# Patient Record
Sex: Male | Born: 1990 | Race: White | Hispanic: No | Marital: Single | Smoking: Never smoker
Health system: Southern US, Community
[De-identification: ages and names within clinical notes are randomized; demographics above are authoritative.]

## PROBLEM LIST (undated history)

## (undated) DIAGNOSIS — G4700 Insomnia, unspecified: Secondary | ICD-10-CM

## (undated) DIAGNOSIS — Z21 Asymptomatic human immunodeficiency virus [HIV] infection status: Secondary | ICD-10-CM

## (undated) DIAGNOSIS — J309 Allergic rhinitis, unspecified: Secondary | ICD-10-CM

## (undated) DIAGNOSIS — B20 Human immunodeficiency virus [HIV] disease: Secondary | ICD-10-CM

## (undated) HISTORY — DX: Allergic rhinitis, unspecified: J30.9

## (undated) HISTORY — DX: Insomnia, unspecified: G47.00

---
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
## 2009-12-17 ENCOUNTER — Telehealth: Payer: Self-pay | Admitting: Gastroenterology

## 2009-12-17 ENCOUNTER — Emergency Department (HOSPITAL_COMMUNITY): Admission: EM | Admit: 2009-12-17 | Discharge: 2009-12-17 | Payer: Self-pay | Admitting: Emergency Medicine

## 2010-01-10 ENCOUNTER — Encounter (INDEPENDENT_AMBULATORY_CARE_PROVIDER_SITE_OTHER): Payer: Self-pay | Admitting: *Deleted

## 2010-07-26 NOTE — Progress Notes (Signed)
Summary: REFLUX?  Pt seen in ED for epigastric pain. CT C/A/P showed fluid in esophagus suggesting reflux. Needs OPV w/i the next month. Pt d/c on two times a day PPI and low fat diet. Discussed with Dr. Margretta Ditty. West Bali MD  December 17, 2009 8:11 AM  Appended Document: REFLUX? Tried to call patient on 01/03/10 to set up a follow up apt.  There was no answer and no answering machine.  jane  Appended Document: REFLUX? Tried calling again today and got no answer or voice mail.  Appended Document: REFLUX? Tried to call patient on 01/07/10 to set up fu apt.  There was no answer and no answering machine.jane  Appended Document: REFLUX? mailed letter to pt

## 2010-07-26 NOTE — Letter (Signed)
Summary: Unable to Reach, Consult Scheduled  [facility]  [address]   [place], [place] [address]   Phone: [phone]  Fax: [phone]    01/10/2010  AALIYAH CANCRO [address] [place], [place]  [address] 1991-02-17   Dear Mr. MERA,   We have been unable to reach you by phone.  Please contact our office with an updated phone number.  At the recommendation of [facility] EMERGENCY DEPT.  we have been asked to schedule you a consult with DR FIELDS for EPIGASTRIC PAIN.  Please call our office at [phone].     Thank you,    Diana Eves  [facility] R. Roetta Sessions, M.D.    Jonette Eva, M.D. Lorenza Burton, FNP-BC    Tana Coast, PA-C Phone: [phone]    Fax: [phone]

## 2010-09-11 LAB — DIFFERENTIAL
Basophils Relative: 0 % (ref 0–1)
Eosinophils Relative: 1 % (ref 0–5)
Monocytes Relative: 6 % (ref 3–12)
Neutrophils Relative %: 63 % (ref 43–77)

## 2010-09-11 LAB — BASIC METABOLIC PANEL
BUN: 7 mg/dL (ref 6–23)
Calcium: 9.1 mg/dL (ref 8.4–10.5)
Creatinine, Ser: 0.89 mg/dL (ref 0.4–1.5)
GFR calc Af Amer: >60 mL/min (ref >60)
Glucose, Bld: 94 mg/dL (ref 70–99)
Potassium: 3.3 mEq/L — ABNORMAL LOW (ref 3.5–5.1)
Sodium: 137 mEq/L (ref 135–145)

## 2010-09-11 LAB — HEPATIC FUNCTION PANEL
ALT: 14 U/L (ref 0–53)
Alkaline Phosphatase: 67 U/L (ref 39–117)
Total Bilirubin: 1 mg/dL (ref 0.3–1.2)

## 2010-09-11 LAB — CBC
HCT: 41.2 % (ref 39.0–52.0)
MCHC: 34.8 g/dL (ref 30.0–36.0)
MCV: 91.4 fL (ref 78.0–100.0)
Platelets: 215 10*3/uL (ref 150–400)
WBC: 8.9 10*3/uL (ref 4.0–10.5)

## 2010-09-11 LAB — LIPASE, BLOOD: Lipase: 22 U/L (ref 11–59)

## 2010-10-28 ENCOUNTER — Emergency Department (HOSPITAL_COMMUNITY)
Admission: EM | Admit: 2010-10-28 | Discharge: 2010-10-28 | Disposition: A | Discharge status: Home / Self Care | Payer: Self-pay | Attending: Emergency Medicine | Admitting: Emergency Medicine

## 2010-10-28 DIAGNOSIS — A562 Chlamydial infection of genitourinary tract, unspecified: Secondary | ICD-10-CM | POA: Insufficient documentation

## 2011-12-30 ENCOUNTER — Encounter (HOSPITAL_COMMUNITY): Payer: Self-pay | Admitting: *Deleted

## 2011-12-30 ENCOUNTER — Emergency Department (HOSPITAL_COMMUNITY)
Admission: EM | Admit: 2011-12-30 | Discharge: 2011-12-30 | Disposition: A | Discharge status: Home / Self Care | Payer: Self-pay | Attending: Emergency Medicine | Admitting: Emergency Medicine

## 2011-12-30 DIAGNOSIS — R21 Rash and other nonspecific skin eruption: Secondary | ICD-10-CM

## 2011-12-30 MED ORDER — PREDNISONE 20 MG PO TABS
60.0000 mg | ORAL_TABLET | Freq: Once | ORAL | Status: AC
  Administered 2011-12-30: 60 mg via ORAL
  Filled 2011-12-30: qty 3

## 2011-12-30 MED ORDER — PREDNISONE 50 MG PO TABS: ORAL_TABLET | ORAL | Status: AC

## 2011-12-30 NOTE — ED Notes (Signed)
Rash x 2 mo with intermittent worsening flare ups. NAD.

## 2011-12-30 NOTE — ED Provider Notes (Signed)
History  This chart was scribed for No att. providers found by Erskine Emery. This patient was seen in room APA03/APA03 and the patient's care was started at 10:24AM.  CSN: [id number]  Arrival date & time 12/30/11  1006   First MD Initiated Contact with Patient 12/30/11 1024      Chief Complaint  Patient presents with  . Rash     Patient is a 21 y.o. male presenting with rash. The history is provided by the patient. No language interpreter was used.  Rash  This is a new problem. The current episode started more than 1 week ago. The problem is associated with nothing. There has been no fever. The rash is present on the abdomen, back and torso. The pain is mild. He has tried anti-itch cream for the symptoms. The treatment provided no relief.    Rick Lam is a 21 y.o. male who presents to the Emergency Department complaining of a rash that he noticed after he began moving. Pt reports noticing a constant rash on chest and abdomen for the past 2 months with intermittent severty, but only recently noticed associated stinging pain and no itching. Pt denies any recent tick or insect bites. Pt denies any new exposures. Pt denies fever, nausea, emesis, and headache. Pt used a hydrocortisone cream without improvement.  No tick bites No HA reported No new meds No fever/weight loss No other complaints   PMH - none  History reviewed. No pertinent past surgical history.  No family history on file.  History  Substance Use Topics  . Smoking status: Never Smoker   . Smokeless tobacco: Not on file  . Alcohol Use: Yes     Occ      Review of Systems  Constitutional: Negative for fever and chills.  Gastrointestinal: Negative for nausea and vomiting.  Skin: Positive for rash.    Allergies  Review of patient's allergies indicates no known allergies.  Home Medications  No current outpatient prescriptions on file.  Triage Vitals: BP 119/76  Pulse 78  Temp 97.6 F (36.4 C) (Oral)   Resp 16  SpO2 100%  Physical Exam  CONSTITUTIONAL: Well developed/well nourished HEAD AND FACE: Normocephalic/atraumatic EYES: EOMI/PERRL ENMT: Mucous membranes moist, no angioedema NECK: supple no meningeal signs SPINE:entire spine nontender CV: S1/S2 noted, no murmurs/rubs/gallops noted LUNGS: Lungs are clear to auscultation bilaterally, no apparent distress ABDOMEN: soft, nontender, no rebound or guarding GU:no cva tenderness NEURO: Pt is awake/alert, moves all extremitiesx4 EXTREMITIES: pulses normal, full ROM SKIN: warm, color normal, macular type rash that blanches without petechiae to his cheset, somewhat resembles urticaria. PSYCH: no abnormalities of mood noted  ED Course  Procedures  DIAGNOSTIC STUDIES: Oxygen Saturation is 100% on room air, normal by my interpretation.    COORDINATION OF CARE:  10:37AM--I discussed treatment plan including blood work and steroids with pt and pt agreed. I informed the pt that the blood work would take a few days to process and that we would be contacted if there were any problems.  RPR ordered to chronicity of rash.  Could also be allergic type rxn.  Given dermatology referral  MDM  Nursing notes including past medical history and social history reviewed and considered in documentation    I personally performed the services described in this documentation, which was scribed in my presence. The recorded information has been reviewed and considered.        Joya Gaskins, MD 12/30/11 1113

## 2013-01-29 ENCOUNTER — Encounter: Payer: Self-pay | Admitting: *Deleted

## 2013-07-14 ENCOUNTER — Encounter: Payer: Self-pay | Admitting: Family Medicine

## 2013-07-14 ENCOUNTER — Ambulatory Visit (INDEPENDENT_AMBULATORY_CARE_PROVIDER_SITE_OTHER): Payer: Self-pay | Admitting: Family Medicine

## 2013-07-14 VITALS — BP 110/80 | Ht 71.0 in | Wt 147.0 lb

## 2013-07-14 DIAGNOSIS — Z21 Asymptomatic human immunodeficiency virus [HIV] infection status: Secondary | ICD-10-CM

## 2013-07-14 DIAGNOSIS — R079 Chest pain, unspecified: Secondary | ICD-10-CM

## 2013-07-14 MED ORDER — CITALOPRAM HYDROBROMIDE 20 MG PO TABS: 20.0000 mg | ORAL_TABLET | Freq: Every day | ORAL | Status: DC

## 2013-07-14 MED ORDER — ZOLPIDEM TARTRATE 10 MG PO TABS: 10.0000 mg | ORAL_TABLET | Freq: Every evening | ORAL | Status: DC | PRN

## 2013-07-14 NOTE — Progress Notes (Signed)
   Subjective:    Patient ID: Rick Lam, male    DOB: [birth year], 23 y.o.   MRN: [id number]  Shortness of Breath This is a new problem. The current episode started more than 1 month ago. The problem occurs intermittently. The problem has been gradually worsening. Associated symptoms include chest pain. Nothing aggravates the symptoms. The patient has no known risk factors for DVT/PE. He has tried nothing for the symptoms. The treatment provided no relief.   2 months not feeling well. He was diagnosed with HIV a couple months ago it's been very stressful to him he has avoided going to at [place] clinic so far  He lives at home with his mom his partner is aware of his diagnosis  Review of Systems  Respiratory: Positive for shortness of breath.   Cardiovascular: Positive for chest pain.       Objective:   Physical Exam Lungs are clear hearts regular abdomen soft extremities no edema skin warm       Assessment & Plan:  #1 anxiety and depression Celexa 20 mg half tablet daily for the course of the next few weeks then increase to one tablet is suicidal ideation nausea vomiting or other problems immediately followup importance of taking the medicine, communication, followup was discussed, followup 4 weeks  #2 HIV diagnosis-unfortunately this is stressing the young man I do believe that it's important go ahead and have him seen by the infectious disease clinic we will help set this up  #3 insomnia understandable recommend using Ambien 10 mg each bedtime when necessary

## 2013-08-01 ENCOUNTER — Telehealth: Payer: Self-pay

## 2013-08-01 NOTE — Telephone Encounter (Signed)
Referral received.  ASAP appointment requested.   Information left on patient's voice mail.  Patient contacted regarding new intake appointment. Date and time given.  Call back for more information and confirmation of appointment info.  Information given regarding documents needed to qualify for financial eligibility.  Laverle Patter, RN

## 2013-08-04 ENCOUNTER — Encounter: Payer: Self-pay | Admitting: Family Medicine

## 2013-08-07 ENCOUNTER — Ambulatory Visit: Payer: Self-pay

## 2013-08-11 ENCOUNTER — Ambulatory Visit: Payer: Self-pay | Admitting: Family Medicine

## 2013-08-29 ENCOUNTER — Emergency Department (HOSPITAL_COMMUNITY): Payer: Self-pay

## 2013-08-29 ENCOUNTER — Encounter (HOSPITAL_COMMUNITY): Payer: Self-pay | Admitting: Emergency Medicine

## 2013-08-29 ENCOUNTER — Emergency Department (HOSPITAL_COMMUNITY)
Admission: EM | Admit: 2013-08-29 | Discharge: 2013-08-29 | Disposition: A | Discharge status: Home / Self Care | Payer: Self-pay | Attending: Emergency Medicine | Admitting: Emergency Medicine

## 2013-08-29 DIAGNOSIS — R51 Headache: Secondary | ICD-10-CM | POA: Insufficient documentation

## 2013-08-29 DIAGNOSIS — IMO0001 Reserved for inherently not codable concepts without codable children: Secondary | ICD-10-CM | POA: Insufficient documentation

## 2013-08-29 DIAGNOSIS — Z8709 Personal history of other diseases of the respiratory system: Secondary | ICD-10-CM | POA: Insufficient documentation

## 2013-08-29 DIAGNOSIS — Z79899 Other long term (current) drug therapy: Secondary | ICD-10-CM | POA: Insufficient documentation

## 2013-08-29 DIAGNOSIS — G4700 Insomnia, unspecified: Secondary | ICD-10-CM | POA: Insufficient documentation

## 2013-08-29 DIAGNOSIS — M791 Myalgia, unspecified site: Secondary | ICD-10-CM

## 2013-08-29 DIAGNOSIS — R599 Enlarged lymph nodes, unspecified: Secondary | ICD-10-CM | POA: Insufficient documentation

## 2013-08-29 DIAGNOSIS — R109 Unspecified abdominal pain: Secondary | ICD-10-CM | POA: Insufficient documentation

## 2013-08-29 DIAGNOSIS — R519 Headache, unspecified: Secondary | ICD-10-CM

## 2013-08-29 LAB — CBC WITH DIFFERENTIAL/PLATELET
Basophils Absolute: 0 10*3/uL (ref 0.0–0.1)
Basophils Relative: 1 % (ref 0–1)
Eosinophils Absolute: 0.2 10*3/uL (ref 0.0–0.7)
Eosinophils Relative: 4 % (ref 0–5)
HEMATOCRIT: 41.6 % (ref 39.0–52.0)
HEMOGLOBIN: 14.2 g/dL (ref 13.0–17.0)
LYMPHS ABS: 1.9 10*3/uL (ref 0.7–4.0)
Lymphocytes Relative: 45 % (ref 12–46)
MCH: 30.1 pg (ref 26.0–34.0)
MCHC: 34.1 g/dL (ref 30.0–36.0)
MCV: 88.3 fL (ref 78.0–100.0)
Monocytes Absolute: 0.4 10*3/uL (ref 0.1–1.0)
Monocytes Relative: 8 % (ref 3–12)
NEUTROS ABS: 1.8 10*3/uL (ref 1.7–7.7)
Neutrophils Relative %: 42 % — ABNORMAL LOW (ref 43–77)
Platelets: 180 10*3/uL (ref 150–400)
RBC: 4.71 MIL/uL (ref 4.22–5.81)
RDW: 12.5 % (ref 11.5–15.5)
WBC: 4.2 10*3/uL (ref 4.0–10.5)

## 2013-08-29 LAB — COMPREHENSIVE METABOLIC PANEL
ALT: 21 U/L (ref 0–53)
AST: 17 U/L (ref 0–37)
Albumin: 3.8 g/dL (ref 3.5–5.2)
Alkaline Phosphatase: 85 U/L (ref 39–117)
BUN: 12 mg/dL (ref 6–23)
CO2: 30 mEq/L (ref 19–32)
Calcium: 8.8 mg/dL (ref 8.4–10.5)
Chloride: 102 mEq/L (ref 96–112)
Creatinine, Ser: 0.94 mg/dL (ref 0.50–1.35)
GFR calc Af Amer: >90 mL/min (ref >90)
GFR calc non Af Amer: >90 mL/min (ref >90)
Glucose, Bld: 92 mg/dL (ref 70–99)
Potassium: 4 mEq/L (ref 3.7–5.3)
Sodium: 140 mEq/L (ref 137–147)
Total Bilirubin: 0.3 mg/dL (ref 0.3–1.2)
Total Protein: 7.6 g/dL (ref 6.0–8.3)

## 2013-08-29 LAB — RAPID STREP SCREEN (MED CTR MEBANE ONLY): Streptococcus, Group A Screen (Direct): NEGATIVE

## 2013-08-29 LAB — CK: Total CK: 48 U/L (ref 7–232)

## 2013-08-29 MED ORDER — KETOROLAC TROMETHAMINE 60 MG/2ML IM SOLN
60.0000 mg | Freq: Once | INTRAMUSCULAR | Status: AC
  Administered 2013-08-29: 60 mg via INTRAMUSCULAR
  Filled 2013-08-29: qty 2

## 2013-08-29 MED ORDER — NAPROXEN 500 MG PO TABS: 500.0000 mg | ORAL_TABLET | Freq: Two times a day (BID) | ORAL | Status: DC

## 2013-08-29 MED ORDER — HYDROCODONE-ACETAMINOPHEN 5-325 MG PO TABS: 2.0000 | ORAL_TABLET | ORAL | Status: DC | PRN

## 2013-08-29 NOTE — Discharge Instructions (Signed)
Please call your doctor for a followup appointment within 24-48 hours. When you talk to your doctor please let them know that you were seen in the emergency department and have them acquire all of your records so that they can discuss the findings with you and formulate a treatment plan to fully care for your new and ongoing problems.  Your strep test and blood work were normal.

## 2013-08-29 NOTE — ED Provider Notes (Signed)
CSN: [id number]     Arrival date & time 08/29/13  0459 History   First MD Initiated Contact with Patient 08/29/13 [phone]     Chief Complaint  Patient presents with  . Generalized Body Aches     (Consider location/radiation/quality/duration/timing/severity/associated sxs/prior Treatment) HPI Comments: -year-old male with a recent diagnosis of HIV, not yet evaluated by infectious disease who presents with several hours of feeling like his legs are going week. This occurred when he was standing up, he fell to the ground, it happened again very quickly and he had to crawl back to bed. He endorses a feeling of diffuse myalgias including his legs, arms, back, abdomen chest and neck. He denies fevers nausea vomiting or diarrhea and has had no blood in the stools. Nothing seems to make this better, worse with standing, associated with bilateral lower extremity paresthesias and a stabbing sensation in his bilateral knees. No recent infections.    The patient does complain of a headache that has been present this evening associated with photophobia, he does not get frequent headaches, this headache is severe.  The history is provided by the patient and medical records.    Past Medical History  Diagnosis Date  . Allergic rhinitis   . Insomnia    History reviewed. No pertinent past surgical history. No family history on file. History  Substance Use Topics  . Smoking status: Never Smoker   . Smokeless tobacco: Not on file  . Alcohol Use: Yes     Comment: Occ    Review of Systems  All other systems reviewed and are negative.      Allergies  Review of patient's allergies indicates no known allergies.  Home Medications   Current Outpatient Rx  Name  Route  Sig  Dispense  Refill  . citalopram (CELEXA) 20 MG tablet   Oral   Take 1 tablet (20 mg total) by mouth daily.   30 tablet   2   . zolpidem (AMBIEN) 10 MG tablet   Oral   Take 1 tablet (10 mg total) by mouth at bedtime as needed  for sleep.   30 tablet   2   . HYDROcodone-acetaminophen (NORCO/VICODIN) 5-325 MG per tablet   Oral   Take 2 tablets by mouth every 4 (four) hours as needed.   10 tablet   0   . naproxen (NAPROSYN) 500 MG tablet   Oral   Take 1 tablet (500 mg total) by mouth 2 (two) times daily with a meal.   30 tablet   0    BP 109/68  Pulse 92  Temp(Src) 98 F (36.7 C)  Resp 21  Ht 5\' 11"  (1.803 m)  Wt 155 lb (70.308 kg)  BMI 21.63 kg/m2  SpO2 100% Physical Exam  Nursing note and vitals reviewed. Constitutional: He appears well-developed and well-nourished. No distress.  HENT:  Head: Normocephalic and atraumatic.  Mouth/Throat: No oropharyngeal exudate.  Bilateral tympanic membranes and secured by cerumen area and oropharynx with mild erythema but no exudate asymmetry or hypertrophy, mucous membranes moist, phonation normal, no significant dental disease  Eyes: Conjunctivae and EOM are normal. Pupils are equal, round, and reactive to light. Right eye exhibits no discharge. Left eye exhibits no discharge. No scleral icterus.  Neck: Normal range of motion. Neck supple. No JVD present. No thyromegaly present.  Cardiovascular: Normal rate, regular rhythm, normal heart sounds and intact distal pulses.  Exam reveals no gallop and no friction rub.   No murmur heard. Pulmonary/Chest:  Effort normal and breath sounds normal. No respiratory distress. He has no wheezes. He has no rales.  Abdominal: Soft. Bowel sounds are normal. He exhibits no distension and no mass. There is tenderness ( Minimal diffuse abdominal tenderness, no guarding or peritoneal signs).  Musculoskeletal: Normal range of motion. He exhibits no edema and no tenderness.  Lymphadenopathy:    He has cervical adenopathy (shotty lymphadenopathy of the right posterior cervical chain in the left submandibular area).  Neurological: He is alert. Coordination normal.  Strength 5 out of 5 in all 4 extremities, follows commands, normal  coordination, normal strength at the hips and knees bilaterally, can straight leg raise without difficulty, normal sensation to light touch and pinprick bilaterally lower extremity. reflexes symmetrical at the bilateral patellar tendons  Skin: Skin is warm and dry. No rash noted. No erythema.  Psychiatric: He has a normal mood and affect. His behavior is normal.    ED Course  Procedures (including critical care time) Labs Review Labs Reviewed  CBC WITH DIFFERENTIAL - Abnormal; Notable for the following:    Neutrophils Relative % 42 (*)    All other components within normal limits  RAPID STREP SCREEN  CULTURE, GROUP A STREP  CK  COMPREHENSIVE METABOLIC PANEL  URINALYSIS, ROUTINE W REFLEX MICROSCOPIC   Imaging Review Ct Head Wo Contrast  08/29/2013   CLINICAL DATA:  Severe headache  EXAM: CT HEAD WITHOUT CONTRAST  TECHNIQUE: Contiguous axial images were obtained from the base of the skull through the vertex without intravenous contrast.  COMPARISON:  None.  FINDINGS: Skull and Sinuses:The adenoid tonsils are enlarged and the scout image. No sinus effusion.  Orbits: No acute abnormality.  Brain: No evidence of acute abnormality, such as acute infarction, hemorrhage, hydrocephalus, or mass lesion/mass effect.  IMPRESSION: No evidence of acute intracranial disease.   Electronically Signed   By: Jorje Guild M.D.   On: 08/29/2013 07:02     EKG Interpretation None      MDM   Final diagnoses:  Myalgia  Headache    Vital signs are unremarkable, the patient does have signs of infection with lymphadenopathy of his neck, he has a significant headache but has no meningeal signs, we'll send a strep swab, obtain basic labs and a CT scan of the head as well as a creatine kinase to evaluate for rhabdomyolysis and a urinalysis. Toradol for headache.  0715 - Pain improved with toradol - CT and labs normal, stable for d/c.  Meds given in ED:  Medications  ketorolac (TORADOL) injection 60 mg  (60 mg Intramuscular Given 08/29/13 0620)    New Prescriptions   HYDROCODONE-ACETAMINOPHEN (NORCO/VICODIN) 5-325 MG PER TABLET    Take 2 tablets by mouth every 4 (four) hours as needed.   NAPROXEN (NAPROSYN) 500 MG TABLET    Take 1 tablet (500 mg total) by mouth 2 (two) times daily with a meal.      Johnna Acosta, MD 08/29/13 [phone]

## 2013-08-29 NOTE — ED Notes (Signed)
Patient ambulated to the ER without difficulty.

## 2013-08-29 NOTE — ED Notes (Signed)
Patient unable to urinate at this time. 

## 2013-08-29 NOTE — ED Notes (Signed)
Body aches, knees are the worse. Stood up twice around 4 am and fell per pt. Aching all over per pt.

## 2013-08-30 LAB — CULTURE, GROUP A STREP

## 2013-10-08 ENCOUNTER — Encounter (HOSPITAL_COMMUNITY): Payer: Self-pay | Admitting: Emergency Medicine

## 2013-10-08 DIAGNOSIS — Y9389 Activity, other specified: Secondary | ICD-10-CM | POA: Insufficient documentation

## 2013-10-08 DIAGNOSIS — Y92410 Unspecified street and highway as the place of occurrence of the external cause: Secondary | ICD-10-CM | POA: Insufficient documentation

## 2013-10-08 DIAGNOSIS — R1110 Vomiting, unspecified: Secondary | ICD-10-CM | POA: Insufficient documentation

## 2013-10-08 DIAGNOSIS — Z79899 Other long term (current) drug therapy: Secondary | ICD-10-CM | POA: Insufficient documentation

## 2013-10-08 DIAGNOSIS — IMO0002 Reserved for concepts with insufficient information to code with codable children: Secondary | ICD-10-CM | POA: Insufficient documentation

## 2013-10-08 DIAGNOSIS — S139XXA Sprain of joints and ligaments of unspecified parts of neck, initial encounter: Secondary | ICD-10-CM | POA: Insufficient documentation
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
## 2013-10-09 ENCOUNTER — Emergency Department (HOSPITAL_COMMUNITY): Payer: Self-pay

## 2013-10-09 ENCOUNTER — Encounter (HOSPITAL_COMMUNITY): Payer: Self-pay | Admitting: Radiology

## 2013-10-09 ENCOUNTER — Emergency Department (HOSPITAL_COMMUNITY)
Admission: EM | Admit: 2013-10-09 | Discharge: 2013-10-09 | Disposition: A | Discharge status: Home / Self Care | Payer: Self-pay | Attending: Emergency Medicine | Admitting: Emergency Medicine

## 2013-10-09 DIAGNOSIS — S161XXA Strain of muscle, fascia and tendon at neck level, initial encounter: Secondary | ICD-10-CM

## 2013-10-09 MED ORDER — ONDANSETRON 4 MG PO TBDP: 4.0000 mg | ORAL_TABLET | Freq: Three times a day (TID) | ORAL | Status: DC | PRN

## 2013-10-09 MED ORDER — NAPROXEN 250 MG PO TABS
500.0000 mg | ORAL_TABLET | Freq: Once | ORAL | Status: AC
  Administered 2013-10-09: 500 mg via ORAL
  Filled 2013-10-09: qty 2

## 2013-10-09 MED ORDER — NAPROXEN 500 MG PO TABS: 500.0000 mg | ORAL_TABLET | Freq: Two times a day (BID) | ORAL | Status: DC

## 2013-10-09 NOTE — Discharge Instructions (Signed)
Your xrays are normal - eat smaller meals to avoid vomiting, naprosyn for pain, zofran for nausea.  Please call your doctor for a followup appointment within 24-48 hours. When you talk to your doctor please let them know that you were seen in the emergency department and have them acquire all of your records so that they can discuss the findings with you and formulate a treatment plan to fully care for your new and ongoing problems.
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
## 2014-05-05 ENCOUNTER — Ambulatory Visit: Payer: Self-pay

## 2014-05-28 ENCOUNTER — Other Ambulatory Visit: Payer: Self-pay | Admitting: Family Medicine

## 2014-05-28 NOTE — Telephone Encounter (Signed)
One and additional refill, needs ov Jan/feb before more rx
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
## 2014-07-22 ENCOUNTER — Other Ambulatory Visit: Payer: Self-pay | Admitting: Family Medicine

## 2014-09-16 ENCOUNTER — Other Ambulatory Visit: Payer: Self-pay | Admitting: Family Medicine

## 2016-01-13 ENCOUNTER — Encounter (HOSPITAL_COMMUNITY): Payer: Self-pay | Admitting: Emergency Medicine

## 2016-01-13 ENCOUNTER — Emergency Department (HOSPITAL_COMMUNITY)
Admission: EM | Admit: 2016-01-13 | Discharge: 2016-01-13 | Disposition: A | Discharge status: Home / Self Care | Payer: No Typology Code available for payment source | Attending: Emergency Medicine | Admitting: Emergency Medicine

## 2016-01-13 DIAGNOSIS — R112 Nausea with vomiting, unspecified: Secondary | ICD-10-CM | POA: Insufficient documentation

## 2016-01-13 DIAGNOSIS — R1110 Vomiting, unspecified: Secondary | ICD-10-CM

## 2016-01-13 DIAGNOSIS — Z21 Asymptomatic human immunodeficiency virus [HIV] infection status: Secondary | ICD-10-CM | POA: Insufficient documentation

## 2016-01-13 HISTORY — DX: Asymptomatic human immunodeficiency virus (hiv) infection status: Z21

## 2016-01-13 HISTORY — DX: Human immunodeficiency virus (HIV) disease: B20

## 2016-01-13 LAB — COMPREHENSIVE METABOLIC PANEL
ALT: 49 U/L (ref 17–63)
ANION GAP: 2 — AB (ref 5–15)
AST: 26 U/L (ref 15–41)
Albumin: 4 g/dL (ref 3.5–5.0)
Alkaline Phosphatase: 89 U/L (ref 38–126)
BILIRUBIN TOTAL: 0.6 mg/dL (ref 0.3–1.2)
BUN: 12 mg/dL (ref 6–20)
CALCIUM: 8.4 mg/dL — AB (ref 8.9–10.3)
CO2: 31 mmol/L (ref 22–32)
CREATININE: 1.26 mg/dL — AB (ref 0.61–1.24)
Chloride: 104 mmol/L (ref 101–111)
GFR calc non Af Amer: >60 mL/min (ref >60)
Glucose, Bld: 87 mg/dL (ref 65–99)
Potassium: 3.6 mmol/L (ref 3.5–5.1)
SODIUM: 137 mmol/L (ref 135–145)
TOTAL PROTEIN: 7.3 g/dL (ref 6.5–8.1)

## 2016-01-13 LAB — LIPASE, BLOOD: Lipase: 18 U/L (ref 11–51)

## 2016-01-13 LAB — CBC
HCT: 44.6 % (ref 39.0–52.0)
HEMOGLOBIN: 15 g/dL (ref 13.0–17.0)
MCH: 30.1 pg (ref 26.0–34.0)
MCHC: 33.6 g/dL (ref 30.0–36.0)
MCV: 89.6 fL (ref 78.0–100.0)
PLATELETS: 253 10*3/uL (ref 150–400)
RBC: 4.98 MIL/uL (ref 4.22–5.81)
RDW: 14.5 % (ref 11.5–15.5)
WBC: 5.7 10*3/uL (ref 4.0–10.5)

## 2016-01-13 MED ORDER — SODIUM CHLORIDE 0.9 % IV BOLUS (SEPSIS)
1000.0000 mL | Freq: Once | INTRAVENOUS | Status: AC
  Administered 2016-01-13: 1000 mL via INTRAVENOUS

## 2016-01-13 MED ORDER — ONDANSETRON HCL 8 MG PO TABS: 8.0000 mg | ORAL_TABLET | ORAL | Status: DC | PRN

## 2016-01-13 MED ORDER — ONDANSETRON HCL 4 MG/2ML IJ SOLN
4.0000 mg | Freq: Once | INTRAMUSCULAR | Status: AC
  Administered 2016-01-13: 4 mg via INTRAVENOUS
  Filled 2016-01-13: qty 2

## 2016-01-13 MED ORDER — FAMOTIDINE IN NACL 20-0.9 MG/50ML-% IV SOLN
20.0000 mg | Freq: Once | INTRAVENOUS | Status: AC
  Administered 2016-01-13: 20 mg via INTRAVENOUS
  Filled 2016-01-13: qty 50

## 2016-01-13 NOTE — ED Provider Notes (Signed)
CSN: [id number]     Arrival date & time 01/13/16  1606 History   First MD Initiated Contact with Patient 01/13/16 1744     Chief Complaint  Patient presents with  . Emesis     (Consider location/radiation/quality/duration/timing/severity/associated sxs/prior Treatment) HPI...Marland KitchenMarland KitchenSeveral episodes of nausea and vomiting for 2 days. Patient feels dehydrated. No fever, sweats, chills, cough, dysuria, stiff neck, neurodeficits. Patient is HIV positive, but he takes his antiviral medicine and states his counts are good. Severity is moderate. Nothing makes symptoms better or worse.  Past Medical History  Diagnosis Date  . Allergic rhinitis   . Insomnia   . HIV (human immunodeficiency virus infection) ([place])    History reviewed. No pertinent past surgical history. Family History  Problem Relation Age of Onset  . Alcoholism Father   . Diabetes Other   . Stroke Other   . Hypertension Other    Social History  Substance Use Topics  . Smoking status: Never Smoker   . Smokeless tobacco: Never Used  . Alcohol Use: Yes     Comment: Occ    Review of Systems  All other systems reviewed and are negative.     Allergies  Review of patient's allergies indicates no known allergies.  Home Medications   Prior to Admission medications   Medication Sig Start Date End Date Taking? Authorizing Provider  citalopram (CELEXA) 20 MG tablet TAKE 1 TABLET BY MOUTH EVERY DAY 09/16/14  Yes Mikey Kirschner, MD  elvitegravir-cobicistat-emtricitabine-tenofovir (GENVOYA) 150-150-200-10 MG TABS tablet Take 1 tablet by mouth at bedtime.  10/13/15  Yes Historical Provider, MD  traZODone (DESYREL) 100 MG tablet Take 100 mg by mouth at bedtime.   Yes Historical Provider, MD  ondansetron (ZOFRAN) 8 MG tablet Take 1 tablet (8 mg total) by mouth every 4 (four) hours as needed. 01/13/16   Nat Christen, MD  zolpidem (AMBIEN) 10 MG tablet TAKE 1 TABLET BY MOUTH EVERY NIGHT AT BEDTIME AS NEEDED FOR SLEEP Patient not taking:  Reported on 01/13/2016 05/29/14   Kathyrn Drown, MD   BP 103/66 mmHg  Pulse 79  Temp(Src) 98.4 F (36.9 C) (Oral)  Resp 20  Ht 6' (1.829 m)  Wt 161 lb (73.029 kg)  BMI 21.83 kg/m2  SpO2 100% Physical Exam  Constitutional: He is oriented to person, place, and time. He appears well-developed and well-nourished.  HENT:  Head: Normocephalic and atraumatic.  Eyes: Conjunctivae are normal.  Neck: Neck supple.  Cardiovascular: Normal rate and regular rhythm.   Pulmonary/Chest: Effort normal and breath sounds normal.  Abdominal: Soft. Bowel sounds are normal.  Musculoskeletal: Normal range of motion.  Neurological: He is alert and oriented to person, place, and time.  Skin: Skin is warm and dry.  Psychiatric: He has a normal mood and affect. His behavior is normal.  Nursing note and vitals reviewed.   ED Course  Procedures (including critical care time) Labs Review Labs Reviewed  COMPREHENSIVE METABOLIC PANEL - Abnormal; Notable for the following:    Creatinine, Ser 1.26 (*)    Calcium 8.4 (*)    Anion gap 2 (*)    All other components within normal limits  LIPASE, BLOOD  CBC  URINALYSIS, ROUTINE W REFLEX MICROSCOPIC (NOT AT [facility])    Imaging Review No results found. I have personally reviewed and evaluated these images and lab results as part of my medical decision-making.   EKG Interpretation None      MDM   Final diagnoses:  Intractable vomiting with nausea,  vomiting of unspecified type    Patient is nontoxic-appearing but dehydrated. He feels much better after 3 L of IV fluids. Vital signs remained stable. Discharge medications Zofran 8 mg    Nat Christen, MD 01/13/16 2131

## 2016-01-13 NOTE — Discharge Instructions (Signed)
Lab tests were good. Prescription for nausea medicine. Increase fluids.

## 2016-01-13 NOTE — ED Notes (Signed)
Informed pt of need for urine sample.

## 2016-01-13 NOTE — ED Notes (Signed)
Pt reports nausea and emesis x 2 days. Pt states he had LOC x 2 yesterday.

## 2016-01-13 NOTE — ED Notes (Signed)
Pt attempted to give urine sample- unable to urinate at this time- Dr Lacinda Axon notified- new orders for # 3 bolus received.

## 2016-01-16 ENCOUNTER — Encounter (HOSPITAL_COMMUNITY): Payer: Self-pay

## 2016-01-16 ENCOUNTER — Emergency Department (HOSPITAL_COMMUNITY)
Admission: EM | Admit: 2016-01-16 | Discharge: 2016-01-16 | Disposition: A | Discharge status: Home / Self Care | Payer: Self-pay | Attending: Physician Assistant | Admitting: Physician Assistant

## 2016-01-16 ENCOUNTER — Emergency Department (HOSPITAL_COMMUNITY): Payer: Self-pay

## 2016-01-16 DIAGNOSIS — Z79899 Other long term (current) drug therapy: Secondary | ICD-10-CM | POA: Insufficient documentation

## 2016-01-16 DIAGNOSIS — R197 Diarrhea, unspecified: Secondary | ICD-10-CM

## 2016-01-16 DIAGNOSIS — R1012 Left upper quadrant pain: Secondary | ICD-10-CM | POA: Insufficient documentation

## 2016-01-16 DIAGNOSIS — E860 Dehydration: Secondary | ICD-10-CM

## 2016-01-16 DIAGNOSIS — R112 Nausea with vomiting, unspecified: Secondary | ICD-10-CM

## 2016-01-16 LAB — CBC
HCT: 51.1 % (ref 39.0–52.0)
HEMOGLOBIN: 17.6 g/dL — AB (ref 13.0–17.0)
MCH: 30.2 pg (ref 26.0–34.0)
MCHC: 34.4 g/dL (ref 30.0–36.0)
MCV: 87.7 fL (ref 78.0–100.0)
Platelets: 308 10*3/uL (ref 150–400)
RBC: 5.83 MIL/uL — ABNORMAL HIGH (ref 4.22–5.81)
RDW: 14 % (ref 11.5–15.5)
WBC: 11.2 10*3/uL — ABNORMAL HIGH (ref 4.0–10.5)

## 2016-01-16 LAB — COMPREHENSIVE METABOLIC PANEL
ALT: 68 U/L — ABNORMAL HIGH (ref 17–63)
ANION GAP: 9 (ref 5–15)
AST: 40 U/L (ref 15–41)
Albumin: 4.3 g/dL (ref 3.5–5.0)
Alkaline Phosphatase: 112 U/L (ref 38–126)
BUN: 12 mg/dL (ref 6–20)
CHLORIDE: 101 mmol/L (ref 101–111)
CO2: 26 mmol/L (ref 22–32)
Calcium: 9.5 mg/dL (ref 8.9–10.3)
Creatinine, Ser: 1.35 mg/dL — ABNORMAL HIGH (ref 0.61–1.24)
GFR calc non Af Amer: >60 mL/min (ref >60)
Glucose, Bld: 122 mg/dL — ABNORMAL HIGH (ref 65–99)
Potassium: 3.7 mmol/L (ref 3.5–5.1)
SODIUM: 136 mmol/L (ref 135–145)
Total Bilirubin: 1.3 mg/dL — ABNORMAL HIGH (ref 0.3–1.2)
Total Protein: 7.7 g/dL (ref 6.5–8.1)

## 2016-01-16 LAB — URINALYSIS, ROUTINE W REFLEX MICROSCOPIC
Bilirubin Urine: NEGATIVE
GLUCOSE, UA: NEGATIVE mg/dL
HGB URINE DIPSTICK: NEGATIVE
Ketones, ur: 15 mg/dL — AB
Leukocytes, UA: NEGATIVE
Nitrite: NEGATIVE
Protein, ur: NEGATIVE mg/dL
Specific Gravity, Urine: >1.046 — ABNORMAL HIGH (ref 1.005–1.030)
pH: 5.5 (ref 5.0–8.0)

## 2016-01-16 LAB — RAPID URINE DRUG SCREEN, HOSP PERFORMED
Amphetamines: NOT DETECTED
BARBITURATES: NOT DETECTED
Benzodiazepines: NOT DETECTED
Cocaine: POSITIVE — AB
OPIATES: POSITIVE — AB
TETRAHYDROCANNABINOL: POSITIVE — AB

## 2016-01-16 LAB — LIPASE, BLOOD: LIPASE: 14 U/L (ref 11–51)

## 2016-01-16 MED ORDER — METOCLOPRAMIDE HCL 10 MG PO TABS: 10.0000 mg | ORAL_TABLET | Freq: Four times a day (QID) | ORAL | 0 refills | Status: DC

## 2016-01-16 MED ORDER — HYDROMORPHONE HCL 1 MG/ML IJ SOLN
0.5000 mg | Freq: Once | INTRAMUSCULAR | Status: AC
  Administered 2016-01-16: 0.5 mg via INTRAVENOUS
  Filled 2016-01-16: qty 1

## 2016-01-16 MED ORDER — HYDROMORPHONE HCL 1 MG/ML IJ SOLN
0.5000 mg | Freq: Once | INTRAMUSCULAR | Status: AC
Start: 2016-01-16 — End: 2016-01-16
  Administered 2016-01-16: 0.5 mg via INTRAVENOUS
  Filled 2016-01-16: qty 1

## 2016-01-16 MED ORDER — SODIUM CHLORIDE 0.9 % IV SOLN
Freq: Once | INTRAVENOUS | Status: AC
  Administered 2016-01-16: 09:00:00 via INTRAVENOUS

## 2016-01-16 MED ORDER — ONDANSETRON HCL 4 MG/2ML IJ SOLN
4.0000 mg | Freq: Once | INTRAMUSCULAR | Status: AC
  Administered 2016-01-16: 4 mg via INTRAVENOUS
  Filled 2016-01-16: qty 2

## 2016-01-16 MED ORDER — SODIUM CHLORIDE 0.9 % IV BOLUS (SEPSIS)
1000.0000 mL | Freq: Once | INTRAVENOUS | Status: AC
Start: 2016-01-16 — End: 2016-01-16
  Administered 2016-01-16: 1000 mL via INTRAVENOUS

## 2016-01-16 MED ORDER — SODIUM CHLORIDE 0.9 % IV BOLUS (SEPSIS)
1000.0000 mL | Freq: Once | INTRAVENOUS | Status: AC
  Administered 2016-01-16: 1000 mL via INTRAVENOUS

## 2016-01-16 MED ORDER — METOCLOPRAMIDE HCL 5 MG/ML IJ SOLN
10.0000 mg | Freq: Once | INTRAMUSCULAR | Status: AC
  Administered 2016-01-16: 10 mg via INTRAVENOUS
  Filled 2016-01-16: qty 2

## 2016-01-16 MED ORDER — IOPAMIDOL (ISOVUE-300) INJECTION 61%
INTRAVENOUS | Status: AC
  Administered 2016-01-16: 100 mL
  Filled 2016-01-16: qty 100

## 2016-01-16 NOTE — ED Notes (Signed)
Pt tolerating po fluids

## 2016-01-16 NOTE — ED Triage Notes (Addendum)
Patient here with 6 days of LUQ abdominal discomfort with vomiting and diarrhea. Seen at [facility] ED 3 days ago and treated with fluid, states his symptoms are unchanged. Pale on arrival, decreased appetite with same. Patient offered zofran ODT, patient declined, states that it doesn't work for him.

## 2016-01-16 NOTE — Discharge Instructions (Signed)
Reglan for nausea and vomiting. Drink plenty of fluids. Follow up with your doctor if not improving.

## 2016-01-16 NOTE — ED Provider Notes (Signed)
[place] DEPT Provider Note   CSN: [id number] Arrival date & time: 01/16/16  [id number]  First Provider Contact:  First MD Initiated Contact with Patient 01/16/16 0827        History   Chief Complaint Chief Complaint  Patient presents with  . Abdominal Pain  . Emesis    HPI Rick Lam is a 25 y.o. male.  Rick Lam is a 25 y.o. male with a history of HIV, presents to emergency department complaining of abdominal pain, nausea, vomiting, diarrhea. Patient states his symptoms started approximately 5-6 days ago. He reports multiple episodes of nausea and vomiting, unable to keep anything down. Also reports watery green diarrhea, multiple episodes a day. Reports lower abdominal pain worse in the left lower quadrant. Denies any blood in his stool or emesis. He denies any fever or chills. He was seen at [facility] 3 days ago, at that time normal lab work, was hydrated and discharged home with Zofran. Patient states he has been taking Zofran but it has not helped his vomiting. Patient also states he has been taking Phenergan and Lomotil that he had left over from prior illness and states that has not helped either.    The history is provided by the patient.    Past Medical History:  Diagnosis Date  . Allergic rhinitis   . HIV (human immunodeficiency virus infection) ([place])   . Insomnia     There are no active problems to display for this patient.   History reviewed. No pertinent surgical history.     Home Medications    Prior to Admission medications   Medication Sig Start Date End Date Taking? Authorizing Provider  citalopram (CELEXA) 20 MG tablet TAKE 1 TABLET BY MOUTH EVERY DAY 09/16/14   Mikey Kirschner, MD  elvitegravir-cobicistat-emtricitabine-tenofovir (GENVOYA) 150-150-200-10 MG TABS tablet Take 1 tablet by mouth at bedtime.  10/13/15   Historical Provider, MD  ondansetron (ZOFRAN) 8 MG tablet Take 1 tablet (8 mg total) by mouth every 4 (four) hours  as needed. 01/13/16   Nat Christen, MD  traZODone (DESYREL) 100 MG tablet Take 100 mg by mouth at bedtime.    Historical Provider, MD  zolpidem (AMBIEN) 10 MG tablet TAKE 1 TABLET BY MOUTH EVERY NIGHT AT BEDTIME AS NEEDED FOR SLEEP Patient not taking: Reported on 01/13/2016 05/29/14   Kathyrn Drown, MD    Family History Family History  Problem Relation Age of Onset  . Alcoholism Father   . Diabetes Other   . Stroke Other   . Hypertension Other     Social History Social History  Substance Use Topics  . Smoking status: Never Smoker  . Smokeless tobacco: Never Used  . Alcohol use Yes     Comment: Occ     Allergies   Review of patient's allergies indicates no known allergies.   Review of Systems Review of Systems  Constitutional: Negative for chills and fever.  HENT: Negative for congestion.   Respiratory: Negative.   Cardiovascular: Negative.   Gastrointestinal: Positive for abdominal pain, diarrhea, nausea and vomiting. Negative for blood in stool.  Genitourinary: Negative for difficulty urinating and dysuria.  Musculoskeletal: Negative for myalgias.  Allergic/Immunologic: Positive for immunocompromised state.  All other systems reviewed and are negative.    Physical Exam Updated Vital Signs BP 111/85 (BP Location: Right Arm)   Pulse 96   Temp 98.2 F (36.8 C) (Oral)   Resp 18   Ht 6' (1.829 m)   [facility]  68 kg   SpO2 97%   BMI 20.34 kg/m   Physical Exam  Constitutional: He appears well-developed and well-nourished.  HENT:  Head: Normocephalic and atraumatic.  Eyes: Conjunctivae are normal.  Neck: Neck supple.  Cardiovascular: Normal rate and regular rhythm.   No murmur heard. Pulmonary/Chest: Effort normal and breath sounds normal. No respiratory distress.  Abdominal: Soft. Bowel sounds are normal. There is tenderness. There is guarding.  Left lower quadrant tenderness  Musculoskeletal: He exhibits no edema.  Neurological: He is alert.  Skin: Skin is warm and  dry.  Psychiatric: He has a normal mood and affect.  Nursing note and vitals reviewed.    ED Treatments / Results  Labs (all labs ordered are listed, but only abnormal results are displayed) Labs Reviewed  COMPREHENSIVE METABOLIC PANEL - Abnormal; Notable for the following:       Result Value   Glucose, Bld 122 (*)    Creatinine, Ser 1.35 (*)    ALT 68 (*)    Total Bilirubin 1.3 (*)    All other components within normal limits  CBC - Abnormal; Notable for the following:    WBC 11.2 (*)    RBC 5.83 (*)    Hemoglobin 17.6 (*)    All other components within normal limits  GASTROINTESTINAL PANEL BY PCR, STOOL (REPLACES STOOL CULTURE)  C DIFFICILE QUICK SCREEN W PCR REFLEX  LIPASE, BLOOD  URINALYSIS, ROUTINE W REFLEX MICROSCOPIC (NOT AT [facility])    EKG  EKG Interpretation None       Radiology No results found.  Procedures Procedures (including critical care time)  Medications Ordered in ED Medications  sodium chloride 0.9 % bolus 1,000 mL (not administered)  0.9 %  sodium chloride infusion (not administered)  ondansetron (ZOFRAN) injection 4 mg (not administered)  HYDROmorphone (DILAUDID) injection 0.5 mg (not administered)     Initial Impression / Assessment and Plan / ED Course  I have reviewed the triage vital signs and the nursing notes.  Pertinent labs & imaging results that were available during my care of the patient were reviewed by me and considered in my medical decision making (see chart for details).  Clinical Course  Comment By Time  CT abdomen is consistent with gastroenteritis, no other acute findings. Patient's creatinine is elevated. Pending urinalysis and stool cultures. We'll try more pain medicines and Reglan, patient continues to have pain. Slightly improved. Will do by mouth challenge. Jeannett Senior, PA-C 07/23 1154  Patient is feeling better after Reglan. He is tolerating ginger ale. Just give [place] a urine sample, will send that off. Still  unable to give [place] a stool sample. Jeannett Senior, PA-C 07/23 1409   8:44 AM Patient emergency department with nausea, vomiting, diarrhea, abdominal pain. He is tachycardic, appears dry. Will start IV fluids, Zofran and IV Dilaudid ordered. Labs today show slightly elevated creatinine, most likely due to dehydration, slightly elevated white blood cell count. Will get CT abdomen and pelvis for further evaluation. Stool studies ordered as well. Will monitor. Patient does have history of HIV, followed by [facility]. States that he had his last CD4 count checked a few months ago states he does not remember what it was but believes it was greater than 200. Lites with his medications.  3:18 PM Patient tolerating by mouth fluids. Drug screen positive for canabis, cocaine, opiods. He feels much better. Still was not able to give me stool sample after 7 hrs. Stable for dc home. Prescription for reglan given.  Final Clinical Impressions(s) / ED Diagnoses   Final diagnoses:  Nausea vomiting and diarrhea  Dehydration    New Prescriptions New Prescriptions   METOCLOPRAMIDE (REGLAN) 10 MG TABLET    Take 1 tablet (10 mg total) by mouth every 6 (six) hours.     Jeannett Senior, PA-C 01/16/16 [place], MD 01/17/16 1336

## 2016-01-16 NOTE — ED Notes (Signed)
Tatyana, PA at the bedside  

## 2016-01-16 NOTE — ED Notes (Signed)
Pt returned from ct placed back on monitor.

## 2017-11-25 ENCOUNTER — Emergency Department (HOSPITAL_COMMUNITY)
Admission: EM | Admit: 2017-11-25 | Discharge: 2017-11-26 | Disposition: A | Discharge status: Home / Self Care | Payer: Self-pay | Attending: Emergency Medicine | Admitting: Emergency Medicine

## 2017-11-25 ENCOUNTER — Encounter (HOSPITAL_COMMUNITY): Payer: Self-pay | Admitting: *Deleted

## 2017-11-25 DIAGNOSIS — Z9119 Patient's noncompliance with other medical treatment and regimen: Secondary | ICD-10-CM | POA: Insufficient documentation

## 2017-11-25 DIAGNOSIS — R21 Rash and other nonspecific skin eruption: Secondary | ICD-10-CM

## 2017-11-25 DIAGNOSIS — B20 Human immunodeficiency virus [HIV] disease: Secondary | ICD-10-CM | POA: Insufficient documentation

## 2017-11-25 DIAGNOSIS — Z79899 Other long term (current) drug therapy: Secondary | ICD-10-CM | POA: Insufficient documentation

## 2017-11-25 DIAGNOSIS — F419 Anxiety disorder, unspecified: Secondary | ICD-10-CM | POA: Insufficient documentation

## 2017-11-25 DIAGNOSIS — Z202 Contact with and (suspected) exposure to infections with a predominantly sexual mode of transmission: Secondary | ICD-10-CM | POA: Insufficient documentation

## 2017-11-25 NOTE — ED Triage Notes (Signed)
Pt with rash all over, recently told by sexual partner that he has syphilis.  Partner is has been treated for it 6 months ago.

## 2017-11-25 NOTE — ED Triage Notes (Signed)
Pt denies SI at present but admits to a lot of stress here lately, father committed suicide recently.

## 2017-11-26 MED ORDER — PENICILLIN G BENZATHINE 1200000 UNIT/2ML IM SUSP
2.4000 10*6.[IU] | Freq: Once | INTRAMUSCULAR | Status: AC
  Administered 2017-11-26: 2.4 10*6.[IU] via INTRAMUSCULAR
  Filled 2017-11-26: qty 4

## 2017-11-26 MED ORDER — PENICILLIN G BENZATHINE 1200000 UNIT/2ML IM SUSP
INTRAMUSCULAR | Status: AC
  Filled 2017-11-26: qty 2

## 2017-11-26 MED ORDER — PENICILLIN G BENZATHINE & PROC 900000-300000 UNIT/2ML IM SUSP: 2.4000 10*6.[IU] | Freq: Once | INTRAMUSCULAR | Status: DC

## 2017-11-26 NOTE — ED Provider Notes (Signed)
[facility] EMERGENCY DEPARTMENT Provider Note   CSN: [id number] Arrival date & time: 11/25/17  2259     History   Chief Complaint Chief Complaint  Patient presents with  . Exposure to STD    HPI Rick Lam is a 27 y.o. male.   Exposure to STD  This is a new problem. The current episode started more than 1 week ago. The problem occurs daily. The problem has been gradually worsening. Nothing aggravates the symptoms. Nothing relieves the symptoms.   Patient with HIV presents with exposure to syphilis.  He reports that a sexual partner told him that he was being treated for syphilis.  He reports his last sexual contact with a person was several months ago.  Patient does report the past several days been having a rash.  Also reports recent penile lesions.  No fevers or vomiting.  He reports he has not been taking his HIV meds for several months.  He reports he supposed to see his infectious disease doctor next month  Reports recent anxiety, but denies any SI. Past Medical History:  Diagnosis Date  . Allergic rhinitis   . HIV (human immunodeficiency virus infection) ([place])   . Insomnia     There are no active problems to display for this patient.   History reviewed. No pertinent surgical history.      Home Medications    Prior to Admission medications   Medication Sig Start Date End Date Taking? Authorizing Provider  citalopram (CELEXA) 20 MG tablet TAKE 1 TABLET BY MOUTH EVERY DAY 09/16/14   Mikey Kirschner, MD  elvitegravir-cobicistat-emtricitabine-tenofovir (GENVOYA) 150-150-200-10 MG TABS tablet Take 1 tablet by mouth at bedtime.  10/13/15   [provider]  metoCLOPramide (REGLAN) 10 MG tablet Take 1 tablet (10 mg total) by mouth every 6 (six) hours. 01/16/16   Kirichenko, Tatyana, PA-C  ondansetron (ZOFRAN) 8 MG tablet Take 1 tablet (8 mg total) by mouth every 4 (four) hours as needed. 01/13/16   Nat Christen, MD  traZODone (DESYREL) 100 MG tablet Take  100 mg by mouth at bedtime.    [provider]  zolpidem (AMBIEN) 10 MG tablet TAKE 1 TABLET BY MOUTH EVERY NIGHT AT BEDTIME AS NEEDED FOR SLEEP Patient not taking: Reported on 01/13/2016 05/29/14   Kathyrn Drown, MD    Family History Family History  Problem Relation Age of Onset  . Alcoholism Father   . Diabetes Other   . Stroke Other   . Hypertension Other     Social History Social History   Tobacco Use  . Smoking status: Never Smoker  . Smokeless tobacco: Never Used  Substance Use Topics  . Alcohol use: Yes    Comment: Occ  . Drug use: No     Allergies   Patient has no known allergies.   Review of Systems Review of Systems  Constitutional: Negative for fever.  Genitourinary:       Penile lesion  Skin: Positive for rash.  Psychiatric/Behavioral: Negative for suicidal ideas. The patient is nervous/anxious.   All other systems reviewed and are negative.    Physical Exam Updated Vital Signs BP 108/79 (BP Location: Right Arm)   Pulse 81   Temp 98.5 F (36.9 C) (Oral)   Resp 16   Ht 1.829 m (6')   Wt 65.8 kg (145 lb)   SpO2 100%   BMI 19.67 kg/m   Physical Exam CONSTITUTIONAL: Well developed/well nourished HEAD: Normocephalic/atraumatic EYES: EOMI ENMT: Mucous membranes moist  NECK: supple no meningeal signs CV: S1/S2 noted, no murmurs/rubs/gallops noted LUNGS: Lungs are clear to auscultation bilaterally, no apparent distress ABDOMEN: soft, nontender GU: Patient declines GU exam NEURO: Pt is awake/alert/appropriate, moves all extremitiesx4.  No facial droop.   EXTREMITIES: pulses normal/equal, full ROM SKIN: Scattered macular erythematous rash to chest and abdomen. Healing rash noted to bilateral palm PSYCH: Anxious  ED Treatments / Results  Labs (all labs ordered are listed, but only abnormal results are displayed) Labs Reviewed  RPR    EKG None  Radiology No results found.  Procedures Procedures   Medications Ordered in  ED Medications  penicillin g benzathine (BICILLIN LA) 1200000 UNIT/2ML injection 2.4 Million Units (has no administration in time range)     Initial Impression / Assessment and Plan / ED Course  I have reviewed the triage vital signs and the nursing notes.      Pt reports exposure to syphilis, he may not be showing signs of secondary syphilis Will check RPR, but plan to empirically treat for syphilis with 2,400,000 units of penicillin  Advised him to follow closely with his infectious disease doctor, he needs to get back on his HIV meds. Otherwise he is awake alert, other than mild anxiety no other distress noted Final Clinical Impressions(s) / ED Diagnoses   Final diagnoses:  STD exposure  Rash    ED Discharge Orders    None       Ripley Fraise, MD 11/26/17 0205

## 2017-11-27 LAB — RPR: RPR: REACTIVE — AB

## 2017-11-27 LAB — RPR, QUANT+TP ABS (REFLEX): T Pallidum Abs: POSITIVE — AB

## 2021-03-11 ENCOUNTER — Encounter (HOSPITAL_COMMUNITY): Payer: Self-pay | Admitting: *Deleted

## 2021-03-11 ENCOUNTER — Emergency Department (HOSPITAL_COMMUNITY)
Admission: EM | Admit: 2021-03-11 | Discharge: 2021-03-11 | Disposition: A | Discharge status: Home / Self Care | Payer: Self-pay | Attending: Emergency Medicine | Admitting: Emergency Medicine

## 2021-03-11 ENCOUNTER — Other Ambulatory Visit: Payer: Self-pay

## 2021-03-11 DIAGNOSIS — B370 Candidal stomatitis: Secondary | ICD-10-CM | POA: Insufficient documentation

## 2021-03-11 DIAGNOSIS — L720 Epidermal cyst: Secondary | ICD-10-CM | POA: Insufficient documentation

## 2021-03-11 DIAGNOSIS — L729 Follicular cyst of the skin and subcutaneous tissue, unspecified: Secondary | ICD-10-CM

## 2021-03-11 DIAGNOSIS — B20 Human immunodeficiency virus [HIV] disease: Secondary | ICD-10-CM | POA: Insufficient documentation

## 2021-03-11 DIAGNOSIS — Z202 Contact with and (suspected) exposure to infections with a predominantly sexual mode of transmission: Secondary | ICD-10-CM | POA: Insufficient documentation

## 2021-03-11 MED ORDER — DOXYCYCLINE HYCLATE 100 MG PO CAPS
100.0000 mg | ORAL_CAPSULE | Freq: Two times a day (BID) | ORAL | 0 refills | Status: DC
Start: 2021-03-11 — End: 2021-06-16

## 2021-03-11 MED ORDER — LIDOCAINE HCL (PF) 1 % IJ SOLN
1.0000 mL | Freq: Once | INTRAMUSCULAR | Status: AC
  Administered 2021-03-11: 1 mL
  Filled 2021-03-11: qty 30

## 2021-03-11 MED ORDER — CEFTRIAXONE SODIUM 500 MG IJ SOLR
500.0000 mg | Freq: Once | INTRAMUSCULAR | Status: AC
  Administered 2021-03-11: 500 mg via INTRAMUSCULAR
  Filled 2021-03-11: qty 500

## 2021-03-11 NOTE — ED Triage Notes (Signed)
Swallowing problems for months, also c/o red lesions on chest

## 2021-03-11 NOTE — Discharge Instructions (Signed)
Make an appointment to follow-up with infectious disease.  Also will call gastroenterology for follow-up.  Continue to take the nystatin.  Take the doxycycline as directed for the STD exposure.

## 2021-03-11 NOTE — ED Provider Notes (Signed)
[facility] EMERGENCY DEPARTMENT Provider Note   CSN: [id number] Arrival date & time: 03/11/21  1538     History Chief Complaint  Patient presents with   Dysphagia    Rick Lam is a 30 y.o. male.  Patient with several concerns.  Patient currently not being followed by infectious disease.  Patient recently started on nystatin for oral thrush.  He is still taking that.  Not sure if it is working.  Past medical history significant for HIV.  Patient also states he had a syphilis exposure.  Not exhibiting any symptoms.  Just got word of that today.  He wants prophylaxis.  Also he has a cyst on his chest.  And left groin area.  Denies any penile discharge.      Past Medical History:  Diagnosis Date   Allergic rhinitis    HIV (human immunodeficiency virus infection) ([place])    Insomnia     There are no problems to display for this patient.   History reviewed. No pertinent surgical history.     Family History  Problem Relation Age of Onset   Alcoholism Father    Diabetes Other    Stroke Other    Hypertension Other     Social History   Tobacco Use   Smoking status: Never   Smokeless tobacco: Never  Substance Use Topics   Alcohol use: Yes    Comment: Occ   Drug use: No    Home Medications Prior to Admission medications   Medication Sig Start Date End Date Taking? Authorizing Provider  doxycycline (VIBRAMYCIN) 100 MG capsule Take 1 capsule (100 mg total) by mouth 2 (two) times daily. 03/11/21  Yes Fredia Sorrow, MD  nystatin (MYCOSTATIN) 100000 UNIT/ML suspension SMARTSIG:4 Milliliter(s) By Mouth 4 Times Daily 02/25/21  Yes [provider]  citalopram (CELEXA) 20 MG tablet TAKE 1 TABLET BY MOUTH EVERY DAY Patient not taking: Reported on 03/11/2021 09/16/14   Mikey Kirschner, MD  elvitegravir-cobicistat-emtricitabine-tenofovir (GENVOYA) 150-150-200-10 MG TABS tablet Take 1 tablet by mouth at bedtime.  Patient not taking: Reported on 03/11/2021 10/13/15    [provider]  elvitegravir-cobicistat-emtricitabine-tenofovir (GENVOYA) 150-150-200-10 MG TABS tablet Take by mouth. Patient not taking: No sig reported 01/31/16   [provider]  emtricitabine-tenofovir (TRUVADA) 200-300 MG tablet Take 1 tablet by mouth daily.    [provider]  escitalopram (LEXAPRO) 5 MG tablet Take 1 tablet by mouth daily. Patient not taking: No sig reported 01/31/16   [provider]  metoCLOPramide (REGLAN) 10 MG tablet Take 1 tablet (10 mg total) by mouth every 6 (six) hours. Patient not taking: No sig reported 01/16/16   Kirichenko, Lahoma Rocker, PA-C  ondansetron (ZOFRAN) 8 MG tablet Take 1 tablet (8 mg total) by mouth every 4 (four) hours as needed. Patient not taking: No sig reported 01/13/16   Nat Christen, MD  traZODone (DESYREL) 100 MG tablet Take 100 mg by mouth at bedtime. Patient not taking: Reported on 03/11/2021    [provider]  valACYclovir (VALTREX) 1000 MG tablet Take by mouth. 03/31/20   [provider]  zolpidem (AMBIEN) 10 MG tablet TAKE 1 TABLET BY MOUTH EVERY NIGHT AT BEDTIME AS NEEDED FOR SLEEP Patient not taking: Reported on 01/13/2016 05/29/14   Kathyrn Drown, MD    Allergies    Patient has no known allergies.  Review of Systems   Review of Systems  Constitutional:  Negative for chills and fever.  HENT:  Positive for trouble swallowing. Negative  for ear pain and sore throat.   Eyes:  Negative for pain and visual disturbance.  Respiratory:  Negative for cough and shortness of breath.   Cardiovascular:  Negative for chest pain and palpitations.  Gastrointestinal:  Negative for abdominal pain and vomiting.  Genitourinary:  Negative for dysuria, genital sores, hematuria, penile discharge, scrotal swelling and testicular pain.  Musculoskeletal:  Negative for arthralgias and back pain.  Skin:  Negative for color change and rash.  Neurological:  Negative for seizures and syncope.  All other systems  reviewed and are negative.  Physical Exam Updated Vital Signs BP 105/79   Pulse 100   Temp 97.8 F (36.6 C) (Oral)   Resp 16   Ht 1.829 m (6')   Wt 70.6 kg   SpO2 100%   BMI 21.11 kg/m   Physical Exam Vitals and nursing note reviewed.  Constitutional:      Appearance: Normal appearance. He is well-developed.  HENT:     Head: Normocephalic and atraumatic.     Mouth/Throat:     Comments: Oropharynx with thrush.  Uvula midline. Eyes:     Extraocular Movements: Extraocular movements intact.     Conjunctiva/sclera: Conjunctivae normal.     Pupils: Pupils are equal, round, and reactive to light.  Cardiovascular:     Rate and Rhythm: Normal rate and regular rhythm.     Heart sounds: No murmur heard. Pulmonary:     Effort: Pulmonary effort is normal. No respiratory distress.     Breath sounds: Normal breath sounds.     Comments: Anterior chest at the sternum with about a 5 mm skin cyst.  Not acutely infected. Abdominal:     Palpations: Abdomen is soft.     Tenderness: There is no abdominal tenderness.  Genitourinary:    Comments: GU exam without any penile discharge.  No lesions. Musculoskeletal:        General: Normal range of motion.     Cervical back: Normal range of motion and neck supple.  Skin:    General: Skin is warm and dry.     Findings: No rash.  Neurological:     General: No focal deficit present.     Mental Status: He is alert and oriented to person, place, and time.     Cranial Nerves: No cranial nerve deficit.     Sensory: No sensory deficit.     Motor: No weakness.    ED Results / Procedures / Treatments   Labs (all labs ordered are listed, but only abnormal results are displayed) Labs Reviewed  RPR    EKG None  Radiology No results found.  Procedures Procedures   Medications Ordered in ED Medications  cefTRIAXone (ROCEPHIN) injection 500 mg (has no administration in time range)  lidocaine (PF) (XYLOCAINE) 1 % injection 1 mL (has no  administration in time range)    ED Course  I have reviewed the triage vital signs and the nursing notes.  Pertinent labs & imaging results that were available during my care of the patient were reviewed by me and considered in my medical decision making (see chart for details).    MDM Rules/Calculators/A&P                           Patient will be treated for the STD exposure.  With Rocephin and then a course of doxycycline.  Patient with oral thrush.  Continue the nystatin.  Will be given referral to infectious  disease.  To get back in for follow-up for his HIV.  Also patient given referral to gastroenterology locally in case he needs upper endoscopy  Patient nontoxic no acute distress.  Afebrile Final Clinical Impression(s) / ED Diagnoses Final diagnoses:  STD exposure  Thrush  Skin cyst  HIV infection, unspecified symptom status ([place])    Rx / DC Orders ED Discharge Orders          Ordered    doxycycline (VIBRAMYCIN) 100 MG capsule  2 times daily        03/11/21 1757             Fredia Sorrow, MD 03/11/21 1801

## 2021-03-12 LAB — RPR
RPR Ser Ql: REACTIVE — AB
RPR Titer: 1:128 {titer}

## 2021-03-14 LAB — T.PALLIDUM AB, TOTAL: T Pallidum Abs: REACTIVE — AB

## 2021-05-25 ENCOUNTER — Telehealth: Payer: Self-pay

## 2021-05-25 NOTE — Telephone Encounter (Signed)
Received call from pt requesting appointment to establish care with office. Patient was previously in care in [place], [place], but moved locally. Has not been on medication since moving to the area. Is scheduled to follow up with ID next week with Dr. Gale Journey. Patient currently has ADAP for coverage. Will call previous provider and have records faxed to office. Will sign up for mychart. Leatrice Jewels, RMA

## 2021-05-26 ENCOUNTER — Telehealth: Payer: Self-pay

## 2021-05-26 ENCOUNTER — Other Ambulatory Visit (HOSPITAL_COMMUNITY): Payer: Self-pay

## 2021-05-26 NOTE — Telephone Encounter (Signed)
[facility] Patient Advocate Encounter ? ?Insurance verification completed.   ? ?The patient is uninsured and will need patient assistance for medication. ? ?We can complete the application and will need to meet with the patient for signatures and income documentation. ? ?Rawad Bochicchio, CPhT ?Specialty Pharmacy Patient Advocate ?[facility] ?Phone: [phone] ?Fax:  [phone]  ?

## 2021-06-01 ENCOUNTER — Other Ambulatory Visit (HOSPITAL_COMMUNITY): Payer: Self-pay

## 2021-06-01 ENCOUNTER — Other Ambulatory Visit: Payer: Self-pay

## 2021-06-01 ENCOUNTER — Encounter: Payer: Self-pay | Admitting: Internal Medicine

## 2021-06-01 ENCOUNTER — Ambulatory Visit (INDEPENDENT_AMBULATORY_CARE_PROVIDER_SITE_OTHER): Payer: Self-pay | Admitting: Internal Medicine

## 2021-06-01 VITALS — BP 113/73 | HR 105 | Temp 97.6°F | Wt 136.0 lb

## 2021-06-01 DIAGNOSIS — B370 Candidal stomatitis: Secondary | ICD-10-CM

## 2021-06-01 DIAGNOSIS — K133 Hairy leukoplakia: Secondary | ICD-10-CM

## 2021-06-01 DIAGNOSIS — A539 Syphilis, unspecified: Secondary | ICD-10-CM

## 2021-06-01 DIAGNOSIS — B20 Human immunodeficiency virus [HIV] disease: Secondary | ICD-10-CM

## 2021-06-01 DIAGNOSIS — C469 Kaposi's sarcoma, unspecified: Secondary | ICD-10-CM | POA: Insufficient documentation

## 2021-06-01 DIAGNOSIS — R0600 Dyspnea, unspecified: Secondary | ICD-10-CM

## 2021-06-01 DIAGNOSIS — R21 Rash and other nonspecific skin eruption: Secondary | ICD-10-CM

## 2021-06-01 HISTORY — DX: Kaposi's sarcoma, unspecified: C46.9

## 2021-06-01 HISTORY — DX: Human immunodeficiency virus (HIV) disease: B20

## 2021-06-01 HISTORY — DX: Hairy leukoplakia: K13.3

## 2021-06-01 HISTORY — DX: Syphilis, unspecified: A53.9

## 2021-06-01 HISTORY — DX: Candidal stomatitis: B37.0

## 2021-06-01 HISTORY — DX: Dyspnea, unspecified: R06.00

## 2021-06-01 HISTORY — DX: Rash and other nonspecific skin eruption: R21

## 2021-06-01 MED ORDER — PENICILLIN G BENZATHINE 1200000 UNIT/2ML IM SUSY
1.2000 10*6.[IU] | PREFILLED_SYRINGE | Freq: Once | INTRAMUSCULAR | Status: AC
  Administered 2021-06-01: 1.2 10*6.[IU] via INTRAMUSCULAR

## 2021-06-01 MED ORDER — FLUCONAZOLE 100 MG PO TABS
200.0000 mg | ORAL_TABLET | Freq: Every day | ORAL | 0 refills | Status: AC
  Filled 2021-06-01: qty 42, 21d supply, fill #0

## 2021-06-01 NOTE — Assessment & Plan Note (Signed)
Has had what he thinks is thrush for 3 months prior to 05/2021 visit. Lots of problem with pain when swallowing and also white plaque on tongue and throat  Also has hypertrophic nodule along gum lines  Started getting medication with spit and squish x3 separate times no effect. Received them at emergency room  Lost weight the last month 20 pounds as he can't eat due to pain

## 2021-06-01 NOTE — Patient Instructions (Addendum)
Will refer you to GI doctor to make sure no other issues with swallowing problem  It is very important you start/continue hiv medications. Will start biktarvy and see in next few visits if anything needs to be changed  Will also place you on a pill for thrush  See [place] in one month   Please also fill out dental form to get dental appointment   Labs today Meet our finance counselor today  Will do 3 weekly antibiotics injection for your syphilis; first shot today then make nurse visit once a week for the next 2 weeks to finish this series

## 2021-06-01 NOTE — Assessment & Plan Note (Signed)
Remember getting 1 IM shot 04/2021; 02/2021 rpr 128. Doesn't recall rash before that. No concerning sx now via ROS as of 06/01/2021
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
## 2021-06-01 NOTE — Assessment & Plan Note (Signed)
He had a nodule on lower inside of gum line 1 month, and 3 purplish papules on body for 3 months, all prior to this 05/2021 visit  Lesions are somewhat uncomfortable  No n/v/hematemesis, abd pain, cough, melena/red blood per rectum  He endorses some sob even at rest. No fever, chill, nightsweat. Never been on bactrim

## 2021-06-01 NOTE — Assessment & Plan Note (Signed)
The rash on his body is highly concerning. The nodule near his gum line could be as well vs just peridontal disease of hiv or other fungal infection. Lymphoma is in the differential  Again will start ART and see how things responds prior to considering biopsy  GI / Pulm kaposi manifestation also considered and will refer him to GI for endoscopy (to r/o other OI condition causing odynophagia), and get chest ct to evaluate his dyspnea

## 2021-06-01 NOTE — Assessment & Plan Note (Signed)
No other respiratory sx  ddx wide. Will start with chest ct. Pjp, kaposi, other OI considered

## 2021-06-01 NOTE — Assessment & Plan Note (Signed)
He does have this on exam 12/07 I do not think biopsy is necessary, unless with ART it doesn't improve

## 2021-06-02 ENCOUNTER — Other Ambulatory Visit: Payer: Self-pay | Admitting: Pharmacist

## 2021-06-02 DIAGNOSIS — B20 Human immunodeficiency virus [HIV] disease: Secondary | ICD-10-CM

## 2021-06-02 MED ORDER — BIKTARVY 50-200-25 MG PO TABS: 1.0000 | ORAL_TABLET | Freq: Every day | ORAL | 0 refills | Status: AC

## 2021-06-02 NOTE — Progress Notes (Signed)
Medication Samples have been provided to the patient.  Drug name: Biktarvy        Strength: 50/200/25 mg       Qty: 14 tablets (2 bottles)   LOT: [id number]   Exp.Date: 03/27/2023  Dosing instructions: Take one tablet by mouth once daily  The patient has been instructed regarding the correct time, dose, and frequency of taking this medication, including desired effects and most common side effects.   Erlinda Solinger L. Eber Hong, PharmD, BCIDP, AAHIVP, CPP Clinical Pharmacist Practitioner Infectious Diseases [facility] 06/07/2020, 10:07 AM

## 2021-06-07 ENCOUNTER — Ambulatory Visit (HOSPITAL_BASED_OUTPATIENT_CLINIC_OR_DEPARTMENT_OTHER): Payer: Self-pay

## 2021-06-08 ENCOUNTER — Other Ambulatory Visit: Payer: Self-pay

## 2021-06-08 ENCOUNTER — Ambulatory Visit (INDEPENDENT_AMBULATORY_CARE_PROVIDER_SITE_OTHER): Payer: Self-pay

## 2021-06-08 DIAGNOSIS — A539 Syphilis, unspecified: Secondary | ICD-10-CM

## 2021-06-08 MED ORDER — PENICILLIN G BENZATHINE 1200000 UNIT/2ML IM SUSY
1.2000 10*6.[IU] | PREFILLED_SYRINGE | Freq: Once | INTRAMUSCULAR | Status: AC
Start: 2021-06-08 — End: 2021-06-08
  Administered 2021-06-08: 12:00:00 1.2 10*6.[IU] via INTRAMUSCULAR

## 2021-06-10 LAB — HCV RNA,QUANTITATIVE REAL TIME PCR
HCV Quantitative Log: 1.89 Log IU/mL — ABNORMAL HIGH
HCV RNA, PCR, QN: 77 IU/mL — ABNORMAL HIGH

## 2021-06-10 LAB — HIV-1 INTEGRASE GENOTYPE

## 2021-06-10 LAB — COMPLETE METABOLIC PANEL WITH GFR
AG Ratio: 0.7 (calc) — ABNORMAL LOW (ref 1.0–2.5)
ALT: 10 U/L (ref 9–46)
AST: 19 U/L (ref 10–40)
Albumin: 3.5 g/dL — ABNORMAL LOW (ref 3.6–5.1)
Alkaline phosphatase (APISO): 297 U/L — ABNORMAL HIGH (ref 36–130)
BUN: 12 mg/dL (ref 7–25)
CO2: 22 mmol/L (ref 20–32)
Calcium: 8.5 mg/dL — ABNORMAL LOW (ref 8.6–10.3)
Chloride: 93 mmol/L — ABNORMAL LOW (ref 98–110)
Creat: 0.67 mg/dL (ref 0.60–1.26)
Globulin: 5 g/dL (calc) — ABNORMAL HIGH (ref 1.9–3.7)
Glucose, Bld: 74 mg/dL (ref 65–99)
Potassium: 3.7 mmol/L (ref 3.5–5.3)
Sodium: 130 mmol/L — ABNORMAL LOW (ref 135–146)
Total Bilirubin: 0.6 mg/dL (ref 0.2–1.2)
Total Protein: 8.5 g/dL — ABNORMAL HIGH (ref 6.1–8.1)
eGFR: 127 mL/min/{1.73_m2} (ref >=60)

## 2021-06-10 LAB — CBC WITH DIFFERENTIAL/PLATELET
Absolute Monocytes: 223 cells/uL (ref 200–950)
Basophils Absolute: 31 cells/uL (ref 0–200)
Basophils Relative: 1.3 %
Eosinophils Absolute: 19 cells/uL (ref 15–500)
Eosinophils Relative: 0.8 %
HCT: 33.2 % — ABNORMAL LOW (ref 38.5–50.0)
Hemoglobin: 11 g/dL — ABNORMAL LOW (ref 13.2–17.1)
Lymphs Abs: 811 cells/uL — ABNORMAL LOW (ref 850–3900)
MCH: 26 pg — ABNORMAL LOW (ref 27.0–33.0)
MCHC: 33.1 g/dL (ref 32.0–36.0)
MCV: 78.5 fL — ABNORMAL LOW (ref 80.0–100.0)
MPV: 10.8 fL (ref 7.5–12.5)
Monocytes Relative: 9.3 %
Neutro Abs: 1315 cells/uL — ABNORMAL LOW (ref 1500–7800)
Neutrophils Relative %: 54.8 %
Platelets: 215 10*3/uL (ref 140–400)
RBC: 4.23 10*6/uL (ref 4.20–5.80)
RDW: 13.6 % (ref 11.0–15.0)
Total Lymphocyte: 33.8 %
WBC: 2.4 10*3/uL — ABNORMAL LOW (ref 3.8–10.8)

## 2021-06-10 LAB — TEST AUTHORIZATION

## 2021-06-10 LAB — QUANTIFERON-TB GOLD PLUS
Mitogen-NIL: 6.13 IU/mL
NIL: 0.03 IU/mL
QuantiFERON-TB Gold Plus: NEGATIVE
TB1-NIL: 0 IU/mL
TB2-NIL: 0 IU/mL

## 2021-06-10 LAB — RPR TITER: RPR Titer: 1:32 {titer} — ABNORMAL HIGH

## 2021-06-10 LAB — CRYPTOCOCCAL AG, LTX SCR RFLX TITER
Cryptococcal Ag Screen: NOT DETECTED
MICRO NUMBER:: 12727519
SPECIMEN QUALITY:: ADEQUATE

## 2021-06-10 LAB — HIV RNA, RTPCR W/R GT (RTI, PI,INT)
HIV 1 RNA Quant: 147000 copies/mL — ABNORMAL HIGH
HIV-1 RNA Quant, Log: 5.17 Log copies/mL — ABNORMAL HIGH

## 2021-06-10 LAB — RPR: RPR Ser Ql: REACTIVE — AB

## 2021-06-10 LAB — HEPATITIS A ANTIBODY, TOTAL: Hepatitis A AB,Total: NONREACTIVE

## 2021-06-10 LAB — HEPATITIS B SURFACE ANTIGEN: Hepatitis B Surface Ag: NONREACTIVE

## 2021-06-10 LAB — FLUORESCENT TREPONEMAL AB(FTA)-IGG-BLD: Fluorescent Treponemal ABS: REACTIVE — AB

## 2021-06-10 LAB — HEPATITIS C ANTIBODY
Hepatitis C Ab: REACTIVE — AB
SIGNAL TO CUT-OFF: 3.52 — ABNORMAL HIGH (ref <1.00)

## 2021-06-10 LAB — HEPATITIS B CORE ANTIBODY, TOTAL: Hep B Core Total Ab: NONREACTIVE

## 2021-06-10 LAB — HIV-1 GENOTYPE: HIV-1 Genotype: DETECTED — AB

## 2021-06-10 LAB — HEPATITIS B SURFACE ANTIBODY, QUANTITATIVE: Hepatitis B-Post: 6 m[IU]/mL — ABNORMAL LOW (ref >=10)

## 2021-06-15 ENCOUNTER — Ambulatory Visit: Payer: Medicaid Other

## 2021-06-15 ENCOUNTER — Other Ambulatory Visit: Payer: Self-pay

## 2021-06-15 ENCOUNTER — Other Ambulatory Visit: Payer: Self-pay | Admitting: Pharmacist

## 2021-06-15 DIAGNOSIS — B20 Human immunodeficiency virus [HIV] disease: Secondary | ICD-10-CM

## 2021-06-16 ENCOUNTER — Encounter: Payer: Self-pay | Admitting: Pharmacist

## 2021-06-16 ENCOUNTER — Other Ambulatory Visit: Payer: Self-pay | Admitting: Pharmacist

## 2021-06-16 DIAGNOSIS — B20 Human immunodeficiency virus [HIV] disease: Secondary | ICD-10-CM

## 2021-06-16 MED ORDER — BIKTARVY 50-200-25 MG PO TABS: 1.0000 | ORAL_TABLET | Freq: Every day | ORAL | 1 refills | Status: DC

## 2021-06-17 ENCOUNTER — Other Ambulatory Visit: Payer: Self-pay

## 2021-06-17 ENCOUNTER — Ambulatory Visit (INDEPENDENT_AMBULATORY_CARE_PROVIDER_SITE_OTHER): Payer: Medicaid Other | Admitting: Physician Assistant

## 2021-06-17 ENCOUNTER — Ambulatory Visit (HOSPITAL_BASED_OUTPATIENT_CLINIC_OR_DEPARTMENT_OTHER)
Admission: RE | Admit: 2021-06-17 | Discharge: 2021-06-17 | Disposition: A | Discharge status: Home / Self Care | Payer: Self-pay | Source: Ambulatory Visit | Attending: Internal Medicine | Admitting: Internal Medicine

## 2021-06-17 ENCOUNTER — Encounter: Payer: Self-pay | Admitting: Physician Assistant

## 2021-06-17 VITALS — BP 118/77 | HR 77 | Resp 16 | Ht 72.0 in | Wt 153.0 lb

## 2021-06-17 DIAGNOSIS — A539 Syphilis, unspecified: Secondary | ICD-10-CM

## 2021-06-17 DIAGNOSIS — T7840XA Allergy, unspecified, initial encounter: Secondary | ICD-10-CM

## 2021-06-17 DIAGNOSIS — R0600 Dyspnea, unspecified: Secondary | ICD-10-CM | POA: Insufficient documentation

## 2021-06-17 DIAGNOSIS — B20 Human immunodeficiency virus [HIV] disease: Secondary | ICD-10-CM | POA: Insufficient documentation

## 2021-06-17 MED ORDER — PENICILLIN G BENZATHINE 1200000 UNIT/2ML IM SUSY
1.2000 10*6.[IU] | PREFILLED_SYRINGE | Freq: Once | INTRAMUSCULAR | Status: AC
  Administered 2021-06-17: 11:00:00 1.2 10*6.[IU] via INTRAMUSCULAR

## 2021-06-17 MED ORDER — PREDNISONE 50 MG PO TABS: 50.0000 mg | ORAL_TABLET | Freq: Every day | ORAL | 0 refills | Status: DC

## 2021-06-17 MED ORDER — DIPHENHYDRAMINE HCL 50 MG/ML IJ SOLN
50.0000 mg | Freq: Once | INTRAMUSCULAR | Status: AC
Start: 2021-06-17 — End: 2021-06-17
  Administered 2021-06-17: 10:00:00 50 mg via INTRAMUSCULAR

## 2021-06-17 MED ORDER — HYDROXYZINE HCL 25 MG PO TABS: 25.0000 mg | ORAL_TABLET | Freq: Three times a day (TID) | ORAL | 0 refills | Status: DC | PRN
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
## 2021-06-17 NOTE — Patient Instructions (Signed)
Last bicillin today Start predinsone 50 mg once daily x 5 days with food Hydroxyzine 25 mg up to 3 times daily for pruritis Please call 911 if you develop shortness of breath, tongue swelling, chest pain Follow up with dr. Gale Journey 07/06/21

## 2021-06-17 NOTE — Addendum Note (Signed)
Addended by: Theresia Majors A on: 06/17/2021 11:06 AM   Modules accepted: Orders

## 2021-06-21 ENCOUNTER — Other Ambulatory Visit: Payer: Self-pay | Admitting: Pharmacist

## 2021-06-21 DIAGNOSIS — B20 Human immunodeficiency virus [HIV] disease: Secondary | ICD-10-CM

## 2021-06-21 MED ORDER — HYDROXYZINE HCL 25 MG PO TABS: 25.0000 mg | ORAL_TABLET | Freq: Three times a day (TID) | ORAL | 0 refills | Status: DC | PRN

## 2021-06-21 MED ORDER — BIKTARVY 50-200-25 MG PO TABS: 1.0000 | ORAL_TABLET | Freq: Every day | ORAL | 1 refills | Status: DC

## 2021-06-21 MED ORDER — PREDNISONE 50 MG PO TABS: 50.0000 mg | ORAL_TABLET | Freq: Every day | ORAL | 0 refills | Status: AC

## 2021-06-21 NOTE — Addendum Note (Signed)
Addended by: Eugenia Mcalpine on: 06/21/2021 10:35 AM   Modules accepted: Orders

## 2021-06-21 NOTE — Telephone Encounter (Signed)
Allo three medications have been sent to [facility] today. Hopefully he can pick them up soon. Thank you!

## 2021-06-21 NOTE — Telephone Encounter (Signed)
Patient called triage related to Mychart message for Biktarvy, prednisone, and hydroxyzine rx. Patient states the pharmacy was out of power during his visit with Claiborne Billings on 12/23 and they do not have his prescriptions. Routing to Pharmacy team to make aware that rx has been resent to pharmacy.  Eugenia Mcalpine

## 2021-07-06 ENCOUNTER — Other Ambulatory Visit: Payer: Self-pay

## 2021-07-06 ENCOUNTER — Ambulatory Visit (INDEPENDENT_AMBULATORY_CARE_PROVIDER_SITE_OTHER): Payer: Medicaid Other | Admitting: Internal Medicine

## 2021-07-06 ENCOUNTER — Encounter: Payer: Self-pay | Admitting: Internal Medicine

## 2021-07-06 VITALS — BP 135/83 | HR 118 | Temp 98.4°F | Wt 164.0 lb

## 2021-07-06 DIAGNOSIS — K1370 Unspecified lesions of oral mucosa: Secondary | ICD-10-CM

## 2021-07-06 DIAGNOSIS — R21 Rash and other nonspecific skin eruption: Secondary | ICD-10-CM

## 2021-07-06 DIAGNOSIS — B20 Human immunodeficiency virus [HIV] disease: Secondary | ICD-10-CM

## 2021-07-06 DIAGNOSIS — R0600 Dyspnea, unspecified: Secondary | ICD-10-CM

## 2021-07-06 DIAGNOSIS — B182 Chronic viral hepatitis C: Secondary | ICD-10-CM

## 2021-07-06 HISTORY — DX: Chronic viral hepatitis C: B18.2

## 2021-07-06 NOTE — Patient Instructions (Signed)
Labs today  Continue biktarvy  If worsening/persistent dyspnea or new severe cough please call me and go to ED  Follow up 1 month  I have referred you to ENT for biopsy of the gum lesion

## 2021-07-06 NOTE — Assessment & Plan Note (Signed)
Suspect kaposi on skin/gum line
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
## 2021-07-07 ENCOUNTER — Other Ambulatory Visit: Payer: Self-pay

## 2021-07-07 ENCOUNTER — Ambulatory Visit: Payer: Self-pay

## 2021-07-07 LAB — T-HELPER CELLS (CD4) COUNT (NOT AT ARMC)
CD4 % Helper T Cell: 3 % — ABNORMAL LOW (ref 33–65)
CD4 T Cell Abs: 57 /uL — ABNORMAL LOW (ref 400–1790)

## 2021-07-11 LAB — HIV-1 RNA QUANT-NO REFLEX-BLD
HIV 1 RNA Quant: 24 Copies/mL — ABNORMAL HIGH
HIV-1 RNA Quant, Log: 1.38 Log cps/mL — ABNORMAL HIGH

## 2021-07-11 LAB — HISTOPLASMA ANTIGEN (BLD, CSF, BRONCH WASH, OTHER)
Interpretation:: NEGATIVE
RESULT:: NOT DETECTED ng/mL

## 2021-07-11 LAB — CBC
HCT: 31.3 % — ABNORMAL LOW (ref 38.5–50.0)
Hemoglobin: 10.1 g/dL — ABNORMAL LOW (ref 13.2–17.1)
MCH: 26.6 pg — ABNORMAL LOW (ref 27.0–33.0)
MCHC: 32.3 g/dL (ref 32.0–36.0)
MCV: 82.6 fL (ref 80.0–100.0)
MPV: 10.3 fL (ref 7.5–12.5)
Platelets: 290 10*3/uL (ref 140–400)
RBC: 3.79 10*6/uL — ABNORMAL LOW (ref 4.20–5.80)
RDW: 16 % — ABNORMAL HIGH (ref 11.0–15.0)
WBC: 4 10*3/uL (ref 3.8–10.8)

## 2021-07-11 LAB — COMPLETE METABOLIC PANEL WITH GFR
AG Ratio: 0.9 (calc) — ABNORMAL LOW (ref 1.0–2.5)
ALT: 16 U/L (ref 9–46)
AST: 15 U/L (ref 10–40)
Albumin: 3.6 g/dL (ref 3.6–5.1)
Alkaline phosphatase (APISO): 200 U/L — ABNORMAL HIGH (ref 36–130)
BUN: 11 mg/dL (ref 7–25)
CO2: 26 mmol/L (ref 20–32)
Calcium: 8.6 mg/dL (ref 8.6–10.3)
Chloride: 103 mmol/L (ref 98–110)
Creat: 0.72 mg/dL (ref 0.60–1.26)
Globulin: 4 g/dL (calc) — ABNORMAL HIGH (ref 1.9–3.7)
Glucose, Bld: 98 mg/dL (ref 65–99)
Potassium: 4 mmol/L (ref 3.5–5.3)
Sodium: 137 mmol/L (ref 135–146)
Total Bilirubin: 0.3 mg/dL (ref 0.2–1.2)
Total Protein: 7.6 g/dL (ref 6.1–8.1)
eGFR: 124 mL/min/{1.73_m2} (ref >=60)

## 2021-07-11 LAB — MVISTA BLASTOMYCES QNT AG, URINE
Interpretation: NEGATIVE
Result (ng/ml): NOT DETECTED ng/mL

## 2021-07-11 LAB — BLASTOMYCES AB, ID: Blastomyces Abs, Qn, DID: NEGATIVE

## 2021-07-11 LAB — FUNGITELL (1-3)-B-D-GLUCAN
(1-3)-B-D-Glucan: <31 pg/mL (ref <60)
Interpretation: NEGATIVE

## 2021-07-12 ENCOUNTER — Encounter: Payer: Self-pay | Admitting: Internal Medicine

## 2021-08-23 ENCOUNTER — Ambulatory Visit: Payer: Medicaid Other | Admitting: Internal Medicine

## 2021-08-31 ENCOUNTER — Telehealth: Payer: Self-pay

## 2021-08-31 NOTE — Telephone Encounter (Signed)
Called patient in regards to the mychart message sent in to reschedule his missed appointment, left patient a voicemail to call us back.  ?

## 2021-09-07 ENCOUNTER — Ambulatory Visit (INDEPENDENT_AMBULATORY_CARE_PROVIDER_SITE_OTHER): Payer: Medicaid Other | Admitting: Internal Medicine

## 2021-09-07 ENCOUNTER — Encounter: Payer: Self-pay | Admitting: Internal Medicine

## 2021-09-07 ENCOUNTER — Other Ambulatory Visit: Payer: Self-pay

## 2021-09-07 DIAGNOSIS — C469 Kaposi's sarcoma, unspecified: Secondary | ICD-10-CM

## 2021-09-07 DIAGNOSIS — B182 Chronic viral hepatitis C: Secondary | ICD-10-CM

## 2021-09-07 DIAGNOSIS — R748 Abnormal levels of other serum enzymes: Secondary | ICD-10-CM

## 2021-09-07 DIAGNOSIS — B20 Human immunodeficiency virus [HIV] disease: Secondary | ICD-10-CM

## 2021-09-07 DIAGNOSIS — K061 Gingival enlargement: Secondary | ICD-10-CM

## 2021-09-07 MED ORDER — SULFAMETHOXAZOLE-TRIMETHOPRIM 800-160 MG PO TABS: 1.0000 | ORAL_TABLET | ORAL | 0 refills | Status: DC

## 2021-09-07 MED ORDER — SULFAMETHOXAZOLE-TRIMETHOPRIM 800-160 MG PO TABS: 1.0000 | ORAL_TABLET | ORAL | 0 refills | Status: AC

## 2021-09-07 NOTE — Patient Instructions (Signed)
Lets restart bactrim ? ?Unless labs are abnormal, we can visit again in 3 months ? ? ?Make sure you see your ENT doctor ? ? ?
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
## 2021-09-17 LAB — COMPREHENSIVE METABOLIC PANEL
AG Ratio: 0.9 (calc) — ABNORMAL LOW (ref 1.0–2.5)
ALT: 11 U/L (ref 9–46)
AST: 12 U/L (ref 10–40)
Albumin: 3.7 g/dL (ref 3.6–5.1)
Alkaline phosphatase (APISO): 83 U/L (ref 36–130)
BUN: 17 mg/dL (ref 7–25)
CO2: 25 mmol/L (ref 20–32)
Calcium: 9 mg/dL (ref 8.6–10.3)
Chloride: 104 mmol/L (ref 98–110)
Creat: 0.96 mg/dL (ref 0.60–1.26)
Globulin: 4.2 g/dL (calc) — ABNORMAL HIGH (ref 1.9–3.7)
Glucose, Bld: 74 mg/dL (ref 65–99)
Potassium: 4.2 mmol/L (ref 3.5–5.3)
Sodium: 138 mmol/L (ref 135–146)
Total Bilirubin: 0.3 mg/dL (ref 0.2–1.2)
Total Protein: 7.9 g/dL (ref 6.1–8.1)

## 2021-09-17 LAB — CBC
HCT: 37.1 % — ABNORMAL LOW (ref 38.5–50.0)
Hemoglobin: 11.8 g/dL — ABNORMAL LOW (ref 13.2–17.1)
MCH: 25.4 pg — ABNORMAL LOW (ref 27.0–33.0)
MCHC: 31.8 g/dL — ABNORMAL LOW (ref 32.0–36.0)
MCV: 80 fL (ref 80.0–100.0)
MPV: 10.1 fL (ref 7.5–12.5)
Platelets: 429 10*3/uL — ABNORMAL HIGH (ref 140–400)
RBC: 4.64 10*6/uL (ref 4.20–5.80)
RDW: 16.8 % — ABNORMAL HIGH (ref 11.0–15.0)
WBC: 3.8 10*3/uL (ref 3.8–10.8)

## 2021-09-17 LAB — HEPATITIS C GENOTYPE: HCV Genotype: NOT DETECTED

## 2021-09-17 LAB — HIV-1 RNA QUANT-NO REFLEX-BLD
HIV 1 RNA Quant: <20 Copies/mL — ABNORMAL HIGH
HIV-1 RNA Quant, Log: <1.3 Log cps/mL — ABNORMAL HIGH

## 2021-10-07 ENCOUNTER — Telehealth: Payer: Self-pay

## 2021-10-07 NOTE — Telephone Encounter (Signed)
Patient called complaining of right leg pain. Patient reports pain radiates from his hip to his lower leg and redness in the leg. Patient denies any injury. Patient advised to go to ED or UC.  ?Zelena Bushong T Jahan Friedlander ? ?

## 2021-10-10 ENCOUNTER — Telehealth: Payer: Medicaid Other | Admitting: Nurse Practitioner

## 2021-10-10 DIAGNOSIS — R2242 Localized swelling, mass and lump, left lower limb: Secondary | ICD-10-CM

## 2021-10-10 NOTE — Progress Notes (Signed)
?Virtual Visit Consent  ? ?[name], you are scheduled for a virtual visit with a [facility] provider today.   ?  ?Just as with appointments in the office, your consent must be obtained to participate.  Your consent will be active for this visit and any virtual visit you may have with one of our providers in the next 365 days.   ?  ?If you have a MyChart account, a copy of this consent can be sent to you electronically.  All virtual visits are billed to your insurance company just like a traditional visit in the office.   ? ?As this is a virtual visit, video technology does not allow for your provider to perform a traditional examination.  This may limit your provider's ability to fully assess your condition.  If your provider identifies any concerns that need to be evaluated in person or the need to arrange testing (such as labs, EKG, etc.), we will make arrangements to do so.   ?  ?Although advances in technology are sophisticated, we cannot ensure that it will always work on either your end or our end.  If the connection with a video visit is poor, the visit may have to be switched to a telephone visit.  With either a video or telephone visit, we are not always able to ensure that we have a secure connection.    ? ?I need to obtain your verbal consent now.   Are you willing to proceed with your visit today?  ?  ?Rick Lam has provided verbal consent on 10/10/2021 for a virtual visit (video or telephone). ?  ?Apolonio Schneiders, FNP  ? ?Date: 10/10/2021 4:30 PM ? ? ?Virtual Visit via Video Note  ? ?IApolonio Schneiders, connected with  Rick Lam  ([id number], 1990/09/01) on 10/10/21 at  4:30 PM EDT by a video-enabled telemedicine application and verified that I am speaking with the correct person using two identifiers. ? ?Location: ?Patient: Virtual Visit Location Patient: Home ?Provider: Virtual Visit Location Provider: Home Office ?  ?I discussed the limitations of evaluation and management by  telemedicine and the availability of in person appointments. The patient expressed understanding and agreed to proceed.   ? ?History of Present Illness: ?Rick Lam is a 31 y.o. who identifies as a male who was assigned male at birth, and is being seen today with complaints of left leg from hip to ankle has been swollen for the past week. ? ?He does not recall an injury or bite  ?This has not occurred in the past  ?Denies fever or systemic symptoms ?Denies numbness to extremity  ?He was ill about 2 weeks ago with fever/viral URI symptoms was not tested for COVID at that time  ? ? ? ? ?Problems:  ?Patient Active Problem List  ? Diagnosis Date Noted  ? Chronic hepatitis C without hepatic coma ([place]) 07/06/2021  ? AIDS (acquired immune deficiency syndrome) ([place]) 06/01/2021  ? Rash 06/01/2021  ? Syphilis 06/01/2021  ? 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 Dyspnea 06/01/2021  ?  ?Allergies:  ?Allergies  ?Allergen Reactions  ? Banana   ?  Other reaction(s): hives  ? ?Medications:  ?Current Outpatient Medications:  ?  bictegravir-emtricitabine-tenofovir AF (BIKTARVY) 50-200-25 MG TABS tablet, Take 1 tablet by mouth daily., Disp: 30 tablet, Rfl: 1 ?  citalopram (CELEXA) 20 MG tablet, TAKE 1 TABLET BY MOUTH EVERY DAY (Patient not taking: Reported on 07/06/2021), Disp: 7 tablet, Rfl: 0 ?  escitalopram (  LEXAPRO) 5 MG tablet, Take 1 tablet by mouth daily. (Patient not taking: Reported on 07/06/2021), Disp: , Rfl:  ?  hydrOXYzine (ATARAX) 25 MG tablet, Take 1 tablet (25 mg total) by mouth 3 (three) times daily as needed for itching. (Patient not taking: Reported on 07/06/2021), Disp: 30 tablet, Rfl: 0 ?  metoCLOPramide (REGLAN) 10 MG tablet, Take 1 tablet (10 mg total) by mouth every 6 (six) hours. (Patient not taking: Reported on 07/06/2021), Disp: 30 tablet, Rfl: 0 ?  nystatin (MYCOSTATIN) 100000 UNIT/ML suspension, SMARTSIG:4 Milliliter(s) By Mouth 4 Times Daily (Patient not taking: Reported on 09/07/2021), Disp: , Rfl:  ?   ondansetron (ZOFRAN) 8 MG tablet, Take 1 tablet (8 mg total) by mouth every 4 (four) hours as needed. (Patient not taking: Reported on 07/06/2021), Disp: 8 tablet, Rfl: 0 ?  sulfamethoxazole-trimethoprim (BACTRIM DS) 800-160 MG tablet, Take 1 tablet by mouth 3 (three) times a week., Disp: 36 tablet, Rfl: 0 ?  traZODone (DESYREL) 100 MG tablet, Take 100 mg by mouth at bedtime. (Patient not taking: Reported on 07/06/2021), Disp: , Rfl:  ?  valACYclovir (VALTREX) 1000 MG tablet, Take by mouth., Disp: , Rfl:  ?  zolpidem (AMBIEN) 10 MG tablet, TAKE 1 TABLET BY MOUTH EVERY NIGHT AT BEDTIME AS NEEDED FOR SLEEP (Patient not taking: Reported on 07/06/2021), Disp: 30 tablet, Rfl: 1 ? ?Observations/Objective: ?Patient is well-developed, well-nourished in no acute distress.  ?Resting comfortably  at home.  ?Head is normocephalic, atraumatic.  ?No labored breathing.  ?Speech is clear and coherent with logical content.  ?Patient is alert and oriented at baseline.  ? ? ?Assessment and Plan: ? ?1. Localized swelling of left lower extremity ?Given patient's PMH and recent illness recommended being seen today at UC/ED. Patient is agreeable and will seek care tonight.  ? ?Discussed risk for DVT & infection patient is aware of urgency of illness  ?   ?Follow Up Instructions: ?I discussed the assessment and treatment plan with the patient. The patient was provided an opportunity to ask questions and all were answered. The patient agreed with the plan and demonstrated an understanding of the instructions.  A copy of instructions were sent to the patient via MyChart unless otherwise noted below.  ? ? ?The patient was advised to call back or seek an in-person evaluation if the symptoms worsen or if the condition fails to improve as anticipated. ? ?Time:  ?I spent 10 minutes with the patient via telehealth technology discussing the above problems/concerns.   ? ?Apolonio Schneiders, FNP  ?

## 2021-10-25 ENCOUNTER — Other Ambulatory Visit: Payer: Self-pay | Admitting: Pharmacist

## 2021-10-25 DIAGNOSIS — B20 Human immunodeficiency virus [HIV] disease: Secondary | ICD-10-CM

## 2021-12-06 ENCOUNTER — Ambulatory Visit: Payer: Self-pay | Admitting: Internal Medicine

## 2022-01-03 ENCOUNTER — Ambulatory Visit: Payer: Self-pay | Admitting: Internal Medicine

## 2022-01-04 ENCOUNTER — Other Ambulatory Visit: Payer: Self-pay | Admitting: Internal Medicine

## 2022-01-04 DIAGNOSIS — B20 Human immunodeficiency virus [HIV] disease: Secondary | ICD-10-CM

## 2022-01-13 ENCOUNTER — Other Ambulatory Visit: Payer: Self-pay

## 2022-01-13 ENCOUNTER — Ambulatory Visit (INDEPENDENT_AMBULATORY_CARE_PROVIDER_SITE_OTHER): Payer: Self-pay | Admitting: Internal Medicine

## 2022-01-13 ENCOUNTER — Ambulatory Visit: Payer: Self-pay

## 2022-01-13 ENCOUNTER — Encounter: Payer: Self-pay | Admitting: Internal Medicine

## 2022-01-13 VITALS — BP 118/76 | HR 115 | Temp 99.0°F | Ht 72.0 in

## 2022-01-13 DIAGNOSIS — Z113 Encounter for screening for infections with a predominantly sexual mode of transmission: Secondary | ICD-10-CM

## 2022-01-13 DIAGNOSIS — B20 Human immunodeficiency virus [HIV] disease: Secondary | ICD-10-CM

## 2022-01-13 DIAGNOSIS — B182 Chronic viral hepatitis C: Secondary | ICD-10-CM

## 2022-01-13 DIAGNOSIS — Z8619 Personal history of other infectious and parasitic diseases: Secondary | ICD-10-CM
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
## 2022-01-13 NOTE — Patient Instructions (Signed)
You are doing very well  Continue bikarvy  Follow up 6 months or sooner if you need

## 2022-01-16 ENCOUNTER — Encounter: Payer: Self-pay | Admitting: Internal Medicine

## 2022-01-17 ENCOUNTER — Telehealth: Payer: Self-pay | Admitting: Internal Medicine

## 2022-01-17 DIAGNOSIS — B20 Human immunodeficiency virus [HIV] disease: Secondary | ICD-10-CM

## 2022-01-17 NOTE — Progress Notes (Signed)
Repeat hiv viral load testing  Suspect testing a few days ago are processing error given good compliance/control up to this point

## 2022-01-17 NOTE — Telephone Encounter (Signed)
Attempted to contact patient to schedule lab appointment. No VM set up to leave a message.   [place], CMA

## 2022-01-18 NOTE — Telephone Encounter (Signed)
I spoke to the patient advised him that we need him to return in a couple of weeks to recheck his VL. Patient scheduled 02/08/22 for labs

## 2022-01-24 LAB — CBC WITH DIFFERENTIAL/PLATELET
Absolute Monocytes: 248 cells/uL (ref 200–950)
Basophils Absolute: 40 cells/uL (ref 0–200)
Basophils Relative: 1.1 %
Eosinophils Absolute: 11 cells/uL — ABNORMAL LOW (ref 15–500)
Eosinophils Relative: 0.3 %
HCT: 41.2 % (ref 38.5–50.0)
Hemoglobin: 13.4 g/dL (ref 13.2–17.1)
Lymphs Abs: 864 cells/uL (ref 850–3900)
MCH: 27 pg (ref 27.0–33.0)
MCHC: 32.5 g/dL (ref 32.0–36.0)
MCV: 83.1 fL (ref 80.0–100.0)
MPV: 10.3 fL (ref 7.5–12.5)
Monocytes Relative: 6.9 %
Neutro Abs: 2437 cells/uL (ref 1500–7800)
Neutrophils Relative %: 67.7 %
Platelets: 233 10*3/uL (ref 140–400)
RBC: 4.96 10*6/uL (ref 4.20–5.80)
RDW: 15.8 % — ABNORMAL HIGH (ref 11.0–15.0)
Total Lymphocyte: 24 %
WBC: 3.6 10*3/uL — ABNORMAL LOW (ref 3.8–10.8)

## 2022-01-24 LAB — RPR: RPR Ser Ql: REACTIVE — AB

## 2022-01-24 LAB — COMPLETE METABOLIC PANEL WITH GFR
AG Ratio: 1.1 (calc) (ref 1.0–2.5)
ALT: 14 U/L (ref 9–46)
AST: 16 U/L (ref 10–40)
Albumin: 4.1 g/dL (ref 3.6–5.1)
Alkaline phosphatase (APISO): 88 U/L (ref 36–130)
BUN: 9 mg/dL (ref 7–25)
CO2: 21 mmol/L (ref 20–32)
Calcium: 8.8 mg/dL (ref 8.6–10.3)
Chloride: 102 mmol/L (ref 98–110)
Creat: 1.17 mg/dL (ref 0.60–1.26)
Globulin: 3.7 g/dL (calc) (ref 1.9–3.7)
Glucose, Bld: 96 mg/dL (ref 65–99)
Potassium: 3.9 mmol/L (ref 3.5–5.3)
Sodium: 136 mmol/L (ref 135–146)
Total Bilirubin: 0.4 mg/dL (ref 0.2–1.2)
Total Protein: 7.8 g/dL (ref 6.1–8.1)
eGFR: 84 mL/min/{1.73_m2} (ref >=60)

## 2022-01-24 LAB — T-HELPER CELLS (CD4) COUNT (NOT AT ARMC)
Absolute CD4: 78 cells/uL — ABNORMAL LOW (ref 490–1740)
CD4 T Helper %: 8 % — ABNORMAL LOW (ref 30–61)
Total lymphocyte count: 984 cells/uL (ref 850–3900)

## 2022-01-24 LAB — FLUORESCENT TREPONEMAL AB(FTA)-IGG-BLD: Fluorescent Treponemal ABS: REACTIVE — AB

## 2022-01-24 LAB — HIV-1 RNA QUANT-NO REFLEX-BLD
HIV 1 RNA Quant: 366000 Copies/mL — ABNORMAL HIGH
HIV-1 RNA Quant, Log: 5.56 Log cps/mL — ABNORMAL HIGH

## 2022-01-24 LAB — RPR TITER: RPR Titer: 1:8 {titer} — ABNORMAL HIGH

## 2022-02-06 ENCOUNTER — Telehealth: Payer: Self-pay | Admitting: Emergency Medicine

## 2022-02-06 DIAGNOSIS — L02412 Cutaneous abscess of left axilla: Secondary | ICD-10-CM

## 2022-02-06 MED ORDER — SULFAMETHOXAZOLE-TRIMETHOPRIM 800-160 MG PO TABS: 1.0000 | ORAL_TABLET | Freq: Two times a day (BID) | ORAL | 0 refills | Status: DC

## 2022-02-06 NOTE — Progress Notes (Signed)
Virtual Visit Consent   Rick Lam, you are scheduled for a virtual visit with a [facility] provider today. Just as with appointments in the office, your consent must be obtained to participate. Your consent will be active for this visit and any virtual visit you may have with one of our providers in the next 365 days. If you have a MyChart account, a copy of this consent can be sent to you electronically.  As this is a virtual visit, video technology does not allow for your provider to perform a traditional examination. This may limit your provider's ability to fully assess your condition. If your provider identifies any concerns that need to be evaluated in person or the need to arrange testing (such as labs, EKG, etc.), we will make arrangements to do so. Although advances in technology are sophisticated, we cannot ensure that it will always work on either your end or our end. If the connection with a video visit is poor, the visit may have to be switched to a telephone visit. With either a video or telephone visit, we are not always able to ensure that we have a secure connection.  By engaging in this virtual visit, you consent to the provision of healthcare and authorize for your insurance to be billed (if applicable) for the services provided during this visit. Depending on your insurance coverage, you may receive a charge related to this service.  I need to obtain your verbal consent now. Are you willing to proceed with your visit today? Rick Lam has provided verbal consent on 02/06/2022 for a virtual visit (video or telephone). Carvel Getting, NP  Date: 02/06/2022 4:43 PM  Virtual Visit via Video Note   I, Carvel Getting, connected with  Rick Lam  ([id number], Feb 14, 1991) on 02/06/22 at  4:30 PM EDT by a video-enabled telemedicine application and verified that I am speaking with the correct person using two identifiers.  Location: Patient: Virtual Visit Location Patient:  Home Provider: Virtual Visit Location Provider: Home Office   I discussed the limitations of evaluation and management by telemedicine and the availability of in person appointments. The patient expressed understanding and agreed to proceed.    History of Present Illness: Rick Lam is a 31 y.o. who identifies as a male who was assigned male at birth, and is being seen today for skin infection, redness in his left axilla.  Patient reports several days of redness and pain, warmth to the touch, in his left axilla.  Reports there are number of lumps there, one is bigger than all of the others.  He reports none of the lumps are "squishy" and I suspect none of them are fluctuant.  Patient does have HIV and is taking Biktarvy.  He has not talked to his infectious disease doctors about this problem but he sees them in 2 days.  No fever.  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 (Patient not taking: Reported on 07/06/2021), Disp: , Rfl:    hydrOXYzine (ATARAX) 25 MG tablet, Take 1 tablet (25 mg total) by mouth 3 (three) times daily as needed for itching. (Patient not taking: Reported on  07/06/2021), Disp: 30 tablet, Rfl: 0   metoCLOPramide (REGLAN) 10 MG tablet, Take 1 tablet (10 mg total) by mouth every 6 (six) hours. (Patient not taking: Reported on 07/06/2021), Disp: 30 tablet, Rfl: 0   nystatin (MYCOSTATIN) 100000 UNIT/ML suspension, SMARTSIG:4 Milliliter(s) By Mouth 4 Times Daily (Patient not taking: Reported on 09/07/2021), Disp: , Rfl:    ondansetron (ZOFRAN) 8 MG tablet, Take 1 tablet (8 mg total) by mouth every 4 (four) hours as needed. (Patient not taking: Reported on 07/06/2021), Disp: 8 tablet, Rfl: 0   traZODone (DESYREL) 100 MG tablet, Take 100 mg by mouth at bedtime. (Patient not taking: Reported on 07/06/2021), Disp: , Rfl:    valACYclovir (VALTREX) 1000 MG tablet, Take by mouth. (Patient not taking: Reported on 01/13/2022), Disp: , Rfl:    zolpidem (AMBIEN) 10 MG tablet, TAKE 1 TABLET BY MOUTH EVERY NIGHT AT BEDTIME AS NEEDED FOR SLEEP (Patient not taking: Reported on 07/06/2021), Disp: 30 tablet, Rfl: 1  Observations/Objective: Patient is well-developed, well-nourished in no acute distress.  Resting comfortably  at home.  Head is normocephalic, atraumatic.  No labored breathing.  Speech is clear and coherent with logical content.  Patient is alert and oriented at baseline.  I can see the skin of left axilla via video.  Erythema from lower axilla area all the way up and encroaching on left upper arm.  There are several small dark, discolored areas that I cannot identify via video.  I am not able to visualize "lumps" via video.  There is 1 area in the left upper extremity nearing the axilla that is an annular ring with central clearing.  Assessment and Plan: 1. Abscess of axilla, left  As patient describes nodules/lumps in left axilla as being "hard," I do not think there is anything to I&D at this time.  It appears by video that he has cellulitis/abscess of the left axilla.  We discussed the use of heat to help the healing process.  I prescribed Bactrim for 2 weeks.   We discussed that if things are getting progressively worse or any of the "lumps" become squishy, he probably needs to have them lanced and pus drained.  Based on review of records, is not clear to me how well controlled or uncontrolled his HIV status is.  Last winter it was uncontrolled, but it appears his infectious disease doctors feel it is better controlled now.  He is scheduled to see them in 2 days; I will route this note to them for follow-up purposes.  The annular ring in left upper arm may be ringworm?  It is difficult to tell via video.  He will show this to his infectious disease doctors in 2 days or seek help sooner if symptoms worsen.   Follow Up Instructions: I discussed the assessment and treatment plan with the patient. The patient was provided an opportunity to ask questions and all were answered. The patient agreed with the plan and demonstrated  an understanding of the instructions.  A copy of instructions were sent to the patient via MyChart unless otherwise noted below.    The patient was advised to call back or seek an in-person evaluation if the symptoms worsen or if the condition fails to improve as anticipated.  Time:  I spent 12 minutes with the patient via telehealth technology discussing the above problems/concerns.    Carvel Getting, NP

## 2022-02-06 NOTE — Patient Instructions (Signed)
[name], thank you for joining Carvel Getting, NP for today's virtual visit.  While this provider is not your primary care provider (PCP), if your PCP is located in our provider database this encounter information will be shared with them immediately following your visit.  Consent: (Patient) Rick Lam provided verbal consent for this virtual visit at the beginning of the encounter.  Current Medications:  Current Outpatient Medications:    sulfamethoxazole-trimethoprim (BACTRIM DS) 800-160 MG tablet, Take 1 tablet by mouth 2 (two) times daily., Disp: 28 tablet, Rfl: 0   BIKTARVY 50-200-25 MG TABS tablet, TAKE 1 TABLET BY MOUTH DAILY, Disp: 30 tablet, Rfl: 0   citalopram (CELEXA) 20 MG tablet, TAKE 1 TABLET BY MOUTH EVERY DAY (Patient not taking: Reported on 07/06/2021), Disp: 7 tablet, Rfl: 0   escitalopram (LEXAPRO) 5 MG tablet, Take 1 tablet by mouth daily. (Patient not taking: Reported on 07/06/2021), Disp: , Rfl:    hydrOXYzine (ATARAX) 25 MG tablet, Take 1 tablet (25 mg total) by mouth 3 (three) times daily as needed for itching. (Patient not taking: Reported on 07/06/2021), Disp: 30 tablet, Rfl: 0   metoCLOPramide (REGLAN) 10 MG tablet, Take 1 tablet (10 mg total) by mouth every 6 (six) hours. (Patient not taking: Reported on 07/06/2021), Disp: 30 tablet, Rfl: 0   nystatin (MYCOSTATIN) 100000 UNIT/ML suspension, SMARTSIG:4 Milliliter(s) By Mouth 4 Times Daily (Patient not taking: Reported on 09/07/2021), Disp: , Rfl:    ondansetron (ZOFRAN) 8 MG tablet, Take 1 tablet (8 mg total) by mouth every 4 (four) hours as needed. (Patient not taking: Reported on 07/06/2021), Disp: 8 tablet, Rfl: 0   traZODone (DESYREL) 100 MG tablet, Take 100 mg by mouth at bedtime. (Patient not taking: Reported on 07/06/2021), Disp: , Rfl:    valACYclovir (VALTREX) 1000 MG tablet, Take by mouth. (Patient not taking: Reported on 01/13/2022), Disp: , Rfl:    zolpidem (AMBIEN) 10 MG tablet, TAKE 1 TABLET BY  MOUTH EVERY NIGHT AT BEDTIME AS NEEDED FOR SLEEP (Patient not taking: Reported on 07/06/2021), Disp: 30 tablet, Rfl: 1   Medications ordered in this encounter:  Meds ordered this encounter  Medications   sulfamethoxazole-trimethoprim (BACTRIM DS) 800-160 MG tablet    Sig: Take 1 tablet by mouth 2 (two) times daily.    Dispense:  28 tablet    Refill:  0     *If you need refills on other medications prior to your next appointment, please contact your pharmacy*  Follow-Up: Call back or seek an in-person evaluation if the symptoms worsen or if the condition fails to improve as anticipated.  Other Instructions Use warm compresses, warm baths, hot showers, or other heat such as a heating pad several times a day to the area to help it heal.  If you feel like the area is getting much worse, you will need to be seen in person.  At a minimum, please show your infectious disease doctors when you see them in 2 days.  You can use ibuprofen and/or Tylenol for pain.   If you have been instructed to have an in-person evaluation today at a local Urgent Care facility, please use the link below. It will take you to a list of all of our available [facility], including address, phone number and hours of operation. Please do not delay care.  [facility]  If you or a family member do not have a primary care provider, use the link below to schedule  a visit and establish care. When you choose a [facility] primary care physician or advanced practice provider, you gain a long-term partner in health. Find a Primary Care Provider  Learn more about [facility]'s in-office and virtual care options: [facility]

## 2022-02-07 ENCOUNTER — Emergency Department (HOSPITAL_COMMUNITY): Payer: Self-pay

## 2022-02-07 ENCOUNTER — Inpatient Hospital Stay (HOSPITAL_COMMUNITY)
Admission: EM | Admit: 2022-02-07 | Discharge: 2022-02-21 | DRG: 974 | Disposition: A | Discharge status: Home / Self Care | Payer: Self-pay | Attending: Internal Medicine | Admitting: Internal Medicine

## 2022-02-07 ENCOUNTER — Encounter (HOSPITAL_COMMUNITY): Payer: Self-pay

## 2022-02-07 ENCOUNTER — Other Ambulatory Visit: Payer: Self-pay

## 2022-02-07 DIAGNOSIS — E861 Hypovolemia: Secondary | ICD-10-CM | POA: Diagnosis present

## 2022-02-07 DIAGNOSIS — R6520 Severe sepsis without septic shock: Secondary | ICD-10-CM

## 2022-02-07 DIAGNOSIS — J918 Pleural effusion in other conditions classified elsewhere: Secondary | ICD-10-CM | POA: Diagnosis present

## 2022-02-07 DIAGNOSIS — R1310 Dysphagia, unspecified: Secondary | ICD-10-CM | POA: Diagnosis not present

## 2022-02-07 DIAGNOSIS — I2690 Septic pulmonary embolism without acute cor pulmonale: Secondary | ICD-10-CM | POA: Diagnosis present

## 2022-02-07 DIAGNOSIS — E871 Hypo-osmolality and hyponatremia: Secondary | ICD-10-CM | POA: Diagnosis present

## 2022-02-07 DIAGNOSIS — I76 Septic arterial embolism: Secondary | ICD-10-CM | POA: Diagnosis present

## 2022-02-07 DIAGNOSIS — B370 Candidal stomatitis: Secondary | ICD-10-CM | POA: Diagnosis not present

## 2022-02-07 DIAGNOSIS — Z91018 Allergy to other foods: Secondary | ICD-10-CM

## 2022-02-07 DIAGNOSIS — J90 Pleural effusion, not elsewhere classified: Secondary | ICD-10-CM

## 2022-02-07 DIAGNOSIS — E872 Acidosis, unspecified: Secondary | ICD-10-CM | POA: Diagnosis present

## 2022-02-07 DIAGNOSIS — Z811 Family history of alcohol abuse and dependence: Secondary | ICD-10-CM

## 2022-02-07 DIAGNOSIS — A419 Sepsis, unspecified organism: Secondary | ICD-10-CM

## 2022-02-07 DIAGNOSIS — Z20822 Contact with and (suspected) exposure to covid-19: Secondary | ICD-10-CM | POA: Diagnosis present

## 2022-02-07 DIAGNOSIS — A4102 Sepsis due to Methicillin resistant Staphylococcus aureus: Principal | ICD-10-CM | POA: Diagnosis present

## 2022-02-07 DIAGNOSIS — N179 Acute kidney failure, unspecified: Secondary | ICD-10-CM | POA: Diagnosis present

## 2022-02-07 DIAGNOSIS — J189 Pneumonia, unspecified organism: Secondary | ICD-10-CM

## 2022-02-07 DIAGNOSIS — Z79899 Other long term (current) drug therapy: Secondary | ICD-10-CM

## 2022-02-07 DIAGNOSIS — L03114 Cellulitis of left upper limb: Secondary | ICD-10-CM | POA: Diagnosis present

## 2022-02-07 DIAGNOSIS — Z6372 Alcoholism and drug addiction in family: Secondary | ICD-10-CM

## 2022-02-07 DIAGNOSIS — B20 Human immunodeficiency virus [HIV] disease: Secondary | ICD-10-CM | POA: Diagnosis present

## 2022-02-07 DIAGNOSIS — B182 Chronic viral hepatitis C: Secondary | ICD-10-CM | POA: Diagnosis present

## 2022-02-07 DIAGNOSIS — J869 Pyothorax without fistula: Secondary | ICD-10-CM | POA: Diagnosis not present

## 2022-02-07 DIAGNOSIS — L02414 Cutaneous abscess of left upper limb: Secondary | ICD-10-CM | POA: Diagnosis present

## 2022-02-07 DIAGNOSIS — J9601 Acute respiratory failure with hypoxia: Secondary | ICD-10-CM | POA: Diagnosis present

## 2022-02-07 DIAGNOSIS — L02412 Cutaneous abscess of left axilla: Secondary | ICD-10-CM | POA: Diagnosis present

## 2022-02-07 LAB — APTT: aPTT: 39 seconds — ABNORMAL HIGH (ref 24–36)

## 2022-02-07 LAB — PROTIME-INR
INR: 1.3 — ABNORMAL HIGH (ref 0.8–1.2)
Prothrombin Time: 16.4 seconds — ABNORMAL HIGH (ref 11.4–15.2)

## 2022-02-07 LAB — CBC WITH DIFFERENTIAL/PLATELET
Abs Immature Granulocytes: 0.09 10*3/uL — ABNORMAL HIGH (ref 0.00–0.07)
Basophils Absolute: 0 10*3/uL (ref 0.0–0.1)
Basophils Relative: 0 %
Eosinophils Absolute: 0 10*3/uL (ref 0.0–0.5)
Eosinophils Relative: 0 %
HCT: 43.7 % (ref 39.0–52.0)
Hemoglobin: 14.2 g/dL (ref 13.0–17.0)
Immature Granulocytes: 1 %
Lymphocytes Relative: 5 %
Lymphs Abs: 0.6 10*3/uL — ABNORMAL LOW (ref 0.7–4.0)
MCH: 27.5 pg (ref 26.0–34.0)
MCHC: 32.5 g/dL (ref 30.0–36.0)
MCV: 84.5 fL (ref 80.0–100.0)
Monocytes Absolute: 0.3 10*3/uL (ref 0.1–1.0)
Monocytes Relative: 3 %
Neutro Abs: 11.2 10*3/uL — ABNORMAL HIGH (ref 1.7–7.7)
Neutrophils Relative %: 91 %
Platelets: 156 10*3/uL (ref 150–400)
RBC: 5.17 MIL/uL (ref 4.22–5.81)
RDW: 15.9 % — ABNORMAL HIGH (ref 11.5–15.5)
WBC: 12.2 10*3/uL — ABNORMAL HIGH (ref 4.0–10.5)
nRBC: 0 % (ref 0.0–0.2)

## 2022-02-07 LAB — RESP PANEL BY RT-PCR (FLU A&B, COVID) ARPGX2
Influenza A by PCR: NEGATIVE
Influenza B by PCR: NEGATIVE
SARS Coronavirus 2 by RT PCR: NEGATIVE

## 2022-02-07 LAB — URINALYSIS, ROUTINE W REFLEX MICROSCOPIC
Bacteria, UA: NONE SEEN
Bilirubin Urine: NEGATIVE
Glucose, UA: NEGATIVE mg/dL
Hgb urine dipstick: NEGATIVE
Ketones, ur: NEGATIVE mg/dL
Nitrite: NEGATIVE
Protein, ur: 30 mg/dL — AB
Specific Gravity, Urine: 1.039 — ABNORMAL HIGH (ref 1.005–1.030)
pH: 5 (ref 5.0–8.0)

## 2022-02-07 LAB — COMPREHENSIVE METABOLIC PANEL
ALT: 44 U/L (ref 0–44)
AST: 55 U/L — ABNORMAL HIGH (ref 15–41)
Albumin: 3.3 g/dL — ABNORMAL LOW (ref 3.5–5.0)
Alkaline Phosphatase: 82 U/L (ref 38–126)
Anion gap: 14 (ref 5–15)
BUN: 22 mg/dL — ABNORMAL HIGH (ref 6–20)
CO2: 21 mmol/L — ABNORMAL LOW (ref 22–32)
Calcium: 8.1 mg/dL — ABNORMAL LOW (ref 8.9–10.3)
Chloride: 93 mmol/L — ABNORMAL LOW (ref 98–111)
Creatinine, Ser: 1.38 mg/dL — ABNORMAL HIGH (ref 0.61–1.24)
GFR, Estimated: >60 mL/min (ref >60)
Glucose, Bld: 124 mg/dL — ABNORMAL HIGH (ref 70–99)
Potassium: 4 mmol/L (ref 3.5–5.1)
Sodium: 128 mmol/L — ABNORMAL LOW (ref 135–145)
Total Bilirubin: 0.9 mg/dL (ref 0.3–1.2)
Total Protein: 8 g/dL (ref 6.5–8.1)

## 2022-02-07 LAB — LACTIC ACID, PLASMA
Lactic Acid, Venous: 4.1 mmol/L (ref 0.5–1.9)
Lactic Acid, Venous: 1.3 mmol/L (ref 0.5–1.9)

## 2022-02-07 MED ORDER — SODIUM CHLORIDE 0.9 % IV BOLUS
1000.0000 mL | Freq: Once | INTRAVENOUS | Status: AC
  Administered 2022-02-07: 1000 mL via INTRAVENOUS

## 2022-02-07 MED ORDER — ONDANSETRON HCL 4 MG/2ML IJ SOLN
4.0000 mg | Freq: Once | INTRAMUSCULAR | Status: AC
  Administered 2022-02-07: 4 mg via INTRAVENOUS
  Filled 2022-02-07: qty 2

## 2022-02-07 MED ORDER — IOHEXOL 300 MG/ML  SOLN
100.0000 mL | Freq: Once | INTRAMUSCULAR | Status: AC | PRN
  Administered 2022-02-07: 75 mL via INTRAVENOUS

## 2022-02-07 MED ORDER — VANCOMYCIN HCL IN DEXTROSE 1-5 GM/200ML-% IV SOLN: 1000.0000 mg | Freq: Once | INTRAVENOUS | Status: DC

## 2022-02-07 MED ORDER — ACETAMINOPHEN 325 MG PO TABS
650.0000 mg | ORAL_TABLET | Freq: Four times a day (QID) | ORAL | Status: DC | PRN
  Administered 2022-02-12 – 2022-02-16 (×2): 650 mg via ORAL
  Filled 2022-02-07 (×2): qty 2

## 2022-02-07 MED ORDER — SODIUM CHLORIDE 0.9 % IV SOLN
2.0000 g | Freq: Three times a day (TID) | INTRAVENOUS | Status: DC
  Administered 2022-02-08 (×2): 2 g via INTRAVENOUS
  Filled 2022-02-07 (×2): qty 12.5

## 2022-02-07 MED ORDER — ONDANSETRON HCL 4 MG PO TABS: 4.0000 mg | ORAL_TABLET | Freq: Four times a day (QID) | ORAL | Status: DC | PRN

## 2022-02-07 MED ORDER — SODIUM CHLORIDE 0.9% FLUSH
3.0000 mL | Freq: Two times a day (BID) | INTRAVENOUS | Status: DC
  Administered 2022-02-08 – 2022-02-21 (×18): 3 mL via INTRAVENOUS

## 2022-02-07 MED ORDER — VANCOMYCIN HCL IN DEXTROSE 1-5 GM/200ML-% IV SOLN
1000.0000 mg | Freq: Once | INTRAVENOUS | Status: DC
  Filled 2022-02-07: qty 200

## 2022-02-07 MED ORDER — SODIUM CHLORIDE 0.9 % IV SOLN
2.0000 g | Freq: Once | INTRAVENOUS | Status: AC
  Administered 2022-02-07: 2 g via INTRAVENOUS
  Filled 2022-02-07: qty 12.5

## 2022-02-07 MED ORDER — VANCOMYCIN HCL IN DEXTROSE 1-5 GM/200ML-% IV SOLN
1000.0000 mg | Freq: Two times a day (BID) | INTRAVENOUS | Status: DC
  Administered 2022-02-08 – 2022-02-09 (×3): 1000 mg via INTRAVENOUS
  Filled 2022-02-07 (×3): qty 200

## 2022-02-07 MED ORDER — TRAZODONE HCL 100 MG PO TABS
100.0000 mg | ORAL_TABLET | Freq: Every evening | ORAL | Status: DC | PRN
  Administered 2022-02-12 – 2022-02-20 (×10): 100 mg via ORAL
  Filled 2022-02-07: qty 2
  Filled 2022-02-07: qty 1
  Filled 2022-02-07: qty 2
  Filled 2022-02-07: qty 1
  Filled 2022-02-07: qty 2
  Filled 2022-02-07 (×4): qty 1
  Filled 2022-02-07: qty 2

## 2022-02-07 MED ORDER — HYDROMORPHONE HCL 1 MG/ML IJ SOLN
1.0000 mg | INTRAMUSCULAR | Status: DC | PRN
  Administered 2022-02-07: 1 mg via INTRAVENOUS
  Filled 2022-02-07: qty 1

## 2022-02-07 MED ORDER — HYDROMORPHONE HCL 1 MG/ML IJ SOLN
1.0000 mg | INTRAMUSCULAR | Status: AC | PRN
  Administered 2022-02-08 (×4): 1 mg via INTRAVENOUS
  Filled 2022-02-07 (×4): qty 1

## 2022-02-07 MED ORDER — OXYCODONE HCL 5 MG PO TABS
5.0000 mg | ORAL_TABLET | ORAL | Status: DC | PRN
  Administered 2022-02-08 – 2022-02-12 (×12): 5 mg via ORAL
  Filled 2022-02-07 (×12): qty 1

## 2022-02-07 MED ORDER — SULFAMETHOXAZOLE-TRIMETHOPRIM 800-160 MG PO TABS: 1.0000 | ORAL_TABLET | Freq: Two times a day (BID) | ORAL | Status: DC

## 2022-02-07 MED ORDER — VANCOMYCIN HCL 1500 MG/300ML IV SOLN
1500.0000 mg | Freq: Once | INTRAVENOUS | Status: AC
  Administered 2022-02-07: 1500 mg via INTRAVENOUS
  Filled 2022-02-07: qty 300

## 2022-02-07 MED ORDER — PIPERACILLIN-TAZOBACTAM 3.375 G IVPB 30 MIN
3.3750 g | Freq: Once | INTRAVENOUS | Status: DC
  Filled 2022-02-07: qty 50

## 2022-02-07 MED ORDER — SODIUM CHLORIDE 0.9 % IV SOLN: INTRAVENOUS | Status: AC

## 2022-02-07 MED ORDER — ACETAMINOPHEN 650 MG RE SUPP: 650.0000 mg | Freq: Four times a day (QID) | RECTAL | Status: DC | PRN

## 2022-02-07 MED ORDER — SENNOSIDES-DOCUSATE SODIUM 8.6-50 MG PO TABS
1.0000 | ORAL_TABLET | Freq: Every evening | ORAL | Status: DC | PRN
Start: 2022-02-07 — End: 2022-02-15

## 2022-02-07 MED ORDER — ACETAMINOPHEN 325 MG PO TABS
650.0000 mg | ORAL_TABLET | Freq: Once | ORAL | Status: AC
  Administered 2022-02-07: 650 mg via ORAL
  Filled 2022-02-07: qty 2

## 2022-02-07 MED ORDER — SODIUM CHLORIDE 0.9 % IV SOLN: INTRAVENOUS | Status: DC

## 2022-02-07 MED ORDER — ENOXAPARIN SODIUM 40 MG/0.4ML IJ SOSY
40.0000 mg | PREFILLED_SYRINGE | INTRAMUSCULAR | Status: DC
  Administered 2022-02-08 – 2022-02-21 (×14): 40 mg via SUBCUTANEOUS
  Filled 2022-02-07 (×15): qty 0.4

## 2022-02-07 MED ORDER — METRONIDAZOLE 500 MG/100ML IV SOLN
500.0000 mg | Freq: Once | INTRAVENOUS | Status: AC
  Administered 2022-02-07: 500 mg via INTRAVENOUS
  Filled 2022-02-07: qty 100

## 2022-02-07 MED ORDER — ONDANSETRON HCL 4 MG/2ML IJ SOLN
4.0000 mg | Freq: Four times a day (QID) | INTRAMUSCULAR | Status: DC | PRN
  Administered 2022-02-08: 4 mg via INTRAVENOUS
  Filled 2022-02-07: qty 2

## 2022-02-07 MED ORDER — SODIUM CHLORIDE 0.9 % IV BOLUS
2500.0000 mL | Freq: Once | INTRAVENOUS | Status: AC
  Administered 2022-02-07: 1000 mL via INTRAVENOUS

## 2022-02-07 MED ORDER — FLUCONAZOLE 100 MG PO TABS
200.0000 mg | ORAL_TABLET | Freq: Once | ORAL | Status: AC
  Administered 2022-02-07: 200 mg via ORAL
  Filled 2022-02-07: qty 2

## 2022-02-07 MED ORDER — HYDROXYZINE HCL 25 MG PO TABS
25.0000 mg | ORAL_TABLET | Freq: Three times a day (TID) | ORAL | Status: DC | PRN
  Administered 2022-02-08 – 2022-02-20 (×23): 25 mg via ORAL
  Filled 2022-02-07 (×23): qty 1

## 2022-02-07 MED ORDER — BICTEGRAVIR-EMTRICITAB-TENOFOV 50-200-25 MG PO TABS
1.0000 | ORAL_TABLET | Freq: Every day | ORAL | Status: DC
  Administered 2022-02-08 – 2022-02-21 (×14): 1 via ORAL
  Filled 2022-02-07 (×15): qty 1

## 2022-02-07 NOTE — ED Notes (Signed)
Dr. Roderic Palau in to speak with pt and family

## 2022-02-07 NOTE — ED Notes (Signed)
[facility] here for transport.  

## 2022-02-07 NOTE — Consult Note (Signed)
NAME:  Rick Lam, MRN:  [id number], DOB:  07-14-1990, LOS: 0 ADMISSION DATE:  02/07/2022, CONSULTATION DATE:  02/07/22 REFERRING MD:  Roderic Palau, CHIEF COMPLAINT:  fever, chest discomfort, weakness   History of Present Illness:  31yM with history of HIV/AIDS with prior KS, syphilis, chronic HCV seen in [facility] in [place]/Dr. Johnny Bridge. He reported several days of worsening redness, pain, warmth in L axilla with a number of 'squishy' lumps to PCP at virtual visit 8/14 and was prescribed a course of bactrim. Pain however became severe enough that he presented to [facility] today where he was found to have sepsis with likely pulmonary, cutaneous, and possible bloodstream source. There was some concern for nec fasc of left arm however surgery reviewed his imaging and felt this was unlikely. He was given 3L crystalloid, vanc, cefepime, flagyl.  At last clinic visit with Dr. Johnny Bridge had been doing well, reportedly adherent to biktarvy and had had improvement in KS-like rash, candida esophagitis and had gained weight. 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 Increase in viral load at that visit, CD4 count 78.  Pertinent  Medical History  HIV/AIDS KS Late latent syphilis, treated Chronic HCV  Significant Hospital Events: Including procedures, antibiotic start and stop dates in addition to other pertinent events   02/06/22 transfer from [facility] to ED, started on vanc/cefepime  Interim History / Subjective:  N/a  Objective   Blood pressure 108/72, pulse (!) 127, temperature 98.7 F (37.1 C), temperature source Oral, resp. rate (!) 29, height 6' (1.829 m), weight 74.4 kg, SpO2 96 %.        Intake/Output Summary (Last 24 hours) at 02/07/2022 1920 Last data filed at 02/07/2022 9924 Gross per 24 hour  Intake 437.14 ml  Output --  Net 437.14 ml   Filed Weights   02/07/22 1610  Weight: 74.4 kg    Examination: General appearance: 31 y.o., male, NAD, conversant  Eyes:  anicteric sclerae; PERRL, tracking appropriately HENT: NCAT; MMM Neck: Trachea midline; no lymphadenopathy, no JVD Lungs: CTAB, no crackles, no wheeze, with normal respiratory effort CV: RRR, no murmur  Abdomen: Soft, non-tender; non-distended, BS present  Extremities: No peripheral edema, warm Skin: Normal turgor and texture; no rash Psych: Appropriate affect Neuro: Alert and oriented to person and place, no focal deficit    Resolved Hospital Problem list    Assessment & Plan:   # Sepsis with pulmonary, SSTI source Atypical infection, BSI with septic embolization/metastatic focus of infection at left arm with either typical bacteria or MTB/NTM. Had a negative quantiferon 05/2021 but in setting of cd4 count ~50. He also had scattered nodular opacities on CT 05/2021.  - airborne precautions and AFBs, sputum cultures - quantiferon - BCx, AFB BCx - TTE - vanc/cefepime - consider bronch if noninvasive workup unrevealing  # Hyponatremia Mild - f/u response to initial resuscitative measures  # Lactic acidosis - trend  # AKI - trend bmet - avoid nephrotoxins  # HIV/AIDS # Positive RPR - continue biktarvy - would consult ID in AM  Best Practice (right click and "Reselect all SmartList Selections" daily)   Per primary  Labs   CBC: Recent Labs  Lab 02/07/22 1701  WBC 12.2*  NEUTROABS 11.2*  HGB 14.2  HCT 43.7  MCV 84.5  PLT 268    Basic Metabolic Panel: Recent Labs  Lab 02/07/22 1701  NA 128*  K 4.0  CL 93*  CO2 21*  GLUCOSE 124*  BUN 22*  CREATININE 1.38*  CALCIUM 8.1*   GFR: Estimated Creatinine Clearance: 80.1 mL/min (A) (by C-G formula based on SCr of 1.38 mg/dL (H)). Recent Labs  Lab 02/07/22 1701  WBC 12.2*  LATICACIDVEN 4.1*    Liver Function Tests: Recent Labs  Lab 02/07/22 1701  AST 55*  ALT 44  ALKPHOS 82  BILITOT 0.9  PROT 8.0  ALBUMIN 3.3*   No results for input(s): "LIPASE", "AMYLASE" in the last 168 hours. No results for  input(s): "AMMONIA" in the last 168 hours.  ABG No results found for: "PHART", "PCO2ART", "PO2ART", "HCO3", "TCO2", "ACIDBASEDEF", "O2SAT"   Coagulation Profile: Recent Labs  Lab 02/07/22 1821  INR 1.3*    Cardiac Enzymes: No results for input(s): "CKTOTAL", "CKMB", "CKMBINDEX", "TROPONINI" in the last 168 hours.  HbA1C: No results found for: "HGBA1C"  CBG: No results for input(s): "GLUCAP" in the last 168 hours.  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   Surgical History:  History reviewed. No pertinent surgical history.   Social History:   reports that he has never smoked. He has never used smokeless tobacco. He reports that he does not currently use alcohol. He reports that he does not use drugs.   Family History:  His family history includes Alcoholism in his father; Diabetes in an other family member; Hypertension in an other family member; Stroke in an other family member.   Allergies Allergies  Allergen Reactions   Banana     Other reaction(s): hives     Home Medications  Prior to Admission medications   Medication Sig Start Date End Date Taking? Authorizing Provider  BIKTARVY 50-200-25 MG TABS tablet TAKE 1 TABLET BY MOUTH DAILY 01/04/22   Vu, Johnny Bridge T, MD  citalopram (CELEXA) 20 MG tablet TAKE 1 TABLET BY MOUTH EVERY DAY Patient not taking: Reported on 07/06/2021 09/16/14   Mikey Kirschner, MD  escitalopram (LEXAPRO) 5 MG tablet Take 1 tablet by mouth daily. Patient not taking: Reported on 07/06/2021 01/31/16    [provider]  hydrOXYzine (ATARAX) 25 MG tablet Take 1 tablet (25 mg total) by mouth 3 (three) times daily as needed for itching. Patient not taking: Reported on 07/06/2021 06/21/21   Robert Bellow, PA-C  metoCLOPramide (REGLAN) 10 MG tablet Take 1 tablet (10 mg total) by mouth every 6 (six) hours. Patient not taking: Reported on 07/06/2021 01/16/16   Jeannett Senior, PA-C  nystatin (MYCOSTATIN) 100000 UNIT/ML suspension SMARTSIG:4 Milliliter(s) By Mouth 4 Times Daily Patient not taking: Reported on 09/07/2021 02/25/21   [provider]  ondansetron (ZOFRAN) 8 MG tablet Take 1 tablet (8 mg total) by mouth every 4 (four) hours as needed. Patient not taking: Reported on 07/06/2021 01/13/16   Nat Christen, MD  sulfamethoxazole-trimethoprim (BACTRIM DS) 800-160 MG tablet Take 1 tablet by mouth 2 (two) times daily. 02/06/22   Carvel Getting, NP  traZODone (DESYREL) 100 MG tablet Take 100 mg by mouth at bedtime. Patient not taking: Reported on 07/06/2021    [provider]  valACYclovir (VALTREX) 1000 MG tablet Take by mouth. Patient not taking: Reported on 01/13/2022 03/31/20  [provider]  zolpidem (AMBIEN) 10 MG tablet TAKE 1 TABLET BY MOUTH EVERY NIGHT AT BEDTIME AS NEEDED FOR SLEEP Patient not taking: Reported on 07/06/2021 05/29/14   Kathyrn Drown, MD     Critical care time: n/a

## 2022-02-07 NOTE — H&P (Incomplete)
History and Physical    Rick Lam ZSW:[id number] DOB: 1991/02/25 DOA: 02/07/2022  PCP: Jabier Mutton, MD   Patient coming from: Home   Chief Complaint: Left arm pain and swelling, chest pain with breathing, fever   HPI: Rick Lam is a pleasant 31 y.o. 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  Patient reports that he was feeling well on 02/05/2022 when he noticed a mild rash involving the left axilla.  By the following day, he had developed some nodules in the left axilla with surrounding redness, pain, and swelling.  He was evaluated by televisit for this on 02/06/2022, diagnosed with cellulitis, and started on Bactrim.  Today, he developed fairly diffuse chest pain that is severe with breathing or cough.  He reports a mild nonproductive cough.  He denies abdominal pain or dysuria.  ED Course: Upon arrival to the ED, patient is found to have an oral temp of 37.8 C with tachycardia in the 322G and systolic blood pressure of 98 and greater.  EKG notable for sinus tachycardia with rate 150.  CT of the chest reveals innumerable pulmonary nodules bilaterally, several of which are cavitating.  Blood work  Review of Systems:  All other systems reviewed and apart from HPI, are negative.  Past Medical History:  Diagnosis Date  . AIDS (acquired immune deficiency syndrome) ([place]) 06/01/2021   dx'ed 2010. Doesn't recall cd4 nadir. Sexual transmission. Gay/msm OI -- currently appears to have thrush as of 05/2021 initial visit with [facility] Started ART 2016  . Allergic rhinitis   . Chronic hepatitis C without hepatic coma ([place]) 07/06/2021  . Dyspnea 06/01/2021   . HIV (human immunodeficiency virus infection) ([place])   . Insomnia   . 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 Rash 06/01/2021   He had a nodule on lower inside of gum line 1 month, and 3 purplish papules on body for 3 months, all prior to this 05/2021 visit  Lesions are somewhat uncomfortable  No n/v/hematemesis, abd pain, cough, melena/red blood per rectum  He endorses some sob even at rest. No fever, chill, nightsweat. Never been on bactrim  . Syphilis 06/01/2021   Remember getting 1 IM shot 04/2021; 02/2021 rpr 128. Doesn't recall rash before that. No concerning sx now via ROS as of 06/01/2021    . Thrush 06/01/2021   Has had what he thinks is thrush for 3 months prior to 05/2021 visit. Lots of problem with pain when swallowing and also white plaque on tongue and throat  Also has hypertrophic nodule along gum lines  Started getting medication with spit and squish x3 separate times no effect. Received them at emergency room    History reviewed. No pertinent surgical history.  Social History:   reports that he has never smoked. He has never used smokeless tobacco. He reports that he does not currently use alcohol. He reports that he does not use drugs.  Allergies  Allergen Reactions  . Banana     Other reaction(s): hives    Family History  Problem Relation Age of Onset  . Alcoholism Father   . Diabetes Other   . Stroke Other   . Hypertension  Other      Prior to Admission medications   Medication Sig Start Date End Date Taking? Authorizing Provider  BIKTARVY 50-200-25 MG TABS tablet TAKE 1 TABLET BY MOUTH DAILY 01/04/22  Yes Vu, Johnny Bridge T, MD  ibuprofen (ADVIL) 200 MG tablet Take 800 mg by mouth every 6 (six) hours as needed.   Yes [provider]  Pediatric Multiple Vitamins (FLINSTONES GUMMIES OMEGA-3 DHA PO) Take by mouth.   Yes [provider]  sulfamethoxazole-trimethoprim (BACTRIM DS) 800-160 MG tablet Take 1 tablet by mouth 2 (two)  times daily. 02/06/22  Yes Carvel Getting, NP  citalopram (CELEXA) 20 MG tablet TAKE 1 TABLET BY MOUTH EVERY DAY Patient not taking: Reported on 07/06/2021 09/16/14   Mikey Kirschner, MD  escitalopram (LEXAPRO) 5 MG tablet Take 1 tablet by mouth daily. Patient not taking: Reported on 07/06/2021 01/31/16   [provider]  hydrOXYzine (ATARAX) 25 MG tablet Take 1 tablet (25 mg total) by mouth 3 (three) times daily as needed for itching. Patient not taking: Reported on 07/06/2021 06/21/21   Robert Bellow, PA-C  metoCLOPramide (REGLAN) 10 MG tablet Take 1 tablet (10 mg total) by mouth every 6 (six) hours. Patient not taking: Reported on 07/06/2021 01/16/16   Jeannett Senior, PA-C  nystatin (MYCOSTATIN) 100000 UNIT/ML suspension SMARTSIG:4 Milliliter(s) By Mouth 4 Times Daily Patient not taking: Reported on 09/07/2021 02/25/21   [provider]  ondansetron (ZOFRAN) 8 MG tablet Take 1 tablet (8 mg total) by mouth every 4 (four) hours as needed. Patient not taking: Reported on 07/06/2021 01/13/16   Nat Christen, MD  traZODone (DESYREL) 100 MG tablet Take 100 mg by mouth at bedtime. Patient not taking: Reported on 07/06/2021    [provider]  zolpidem (AMBIEN) 10 MG tablet TAKE 1 TABLET BY MOUTH EVERY NIGHT AT BEDTIME AS NEEDED FOR SLEEP Patient not taking: Reported on 07/06/2021 05/29/14   Kathyrn Drown, MD    Physical Exam: Vitals:   02/07/22 2015 02/07/22 2200 02/07/22 2215 02/07/22 2315  BP: (!) 101/59 (!) 93/58 (!) 94/57 (!) 103/59  Pulse: (!) 122 (!) 110 (!) 110 (!) 108  Resp: (!) 26 (!) 25 (!) 21 (!) 23  Temp:      TempSrc:      SpO2: 98% 97% 96% 97%  Weight:      Height:         Constitutional: NAD, calm  Eyes: PERTLA, lids and conjunctivae normal ENMT: Mucous membranes are moist. Posterior pharynx clear of any exudate or lesions.   Neck: supple, no masses  Respiratory: clear to auscultation bilaterally, no wheezing, no crackles. No accessory muscle use.   Cardiovascular: S1 & S2 heard, regular rate and rhythm. No extremity edema. No significant JVD. Abdomen: No distension, no tenderness, soft. Bowel sounds active.  Musculoskeletal: no clubbing / cyanosis. No joint deformity upper and lower extremities.   Skin: no significant rashes, lesions, ulcers. Warm, dry, well-perfused. Neurologic: CN 2-12 grossly intact. Sensation intact, DTR normal. Strength 5/5 in all 4 limbs. Alert and oriented.  Psychiatric: Pleasant. Cooperative.    Labs and Imaging on Admission: I have personally reviewed following labs and imaging studies  CBC: Recent Labs  Lab 02/07/22 1701  WBC 12.2*  NEUTROABS 11.2*  HGB 14.2  HCT 43.7  MCV 84.5  PLT 409   Basic Metabolic Panel: Recent Labs  Lab 02/07/22 1701  NA 128*  K 4.0  CL 93*  CO2 21*  GLUCOSE 124*  BUN  22*  CREATININE 1.38*  CALCIUM 8.1*   GFR: Estimated Creatinine Clearance: 80.1 mL/min (A) (by C-G formula based on SCr of 1.38 mg/dL (H)). Liver Function Tests: Recent Labs  Lab 02/07/22 1701  AST 55*  ALT 44  ALKPHOS 82  BILITOT 0.9  PROT 8.0  ALBUMIN 3.3*   No results for input(s): "LIPASE", "AMYLASE" in the last 168 hours. No results for input(s): "AMMONIA" in the last 168 hours. Coagulation Profile: Recent Labs  Lab 02/07/22 1821  INR 1.3*   Cardiac Enzymes: No results for input(s): "CKTOTAL", "CKMB", "CKMBINDEX", "TROPONINI" in the last 168 hours. BNP (last 3 results) No results for input(s): "PROBNP" in the last 8760 hours. HbA1C: No results for input(s): "HGBA1C" in the last 72 hours. CBG: No results for input(s): "GLUCAP" in the last 168 hours. Lipid Profile: No results for input(s): "CHOL", "HDL", "LDLCALC", "TRIG", "CHOLHDL", "LDLDIRECT" in the last 72 hours. Thyroid Function Tests: No results for input(s): "TSH", "T4TOTAL", "FREET4", "T3FREE", "THYROIDAB" in the last 72 hours. Anemia Panel: No results for input(s): "VITAMINB12", "FOLATE", "FERRITIN", "TIBC",  "IRON", "RETICCTPCT" in the last 72 hours. Urine analysis:    Component Value Date/Time   COLORURINE YELLOW 02/07/2022 2332   APPEARANCEUR CLEAR 02/07/2022 2332   LABSPEC 1.039 (H) 02/07/2022 2332   PHURINE 5.0 02/07/2022 2332   GLUCOSEU NEGATIVE 02/07/2022 2332   HGBUR NEGATIVE 02/07/2022 2332   BILIRUBINUR NEGATIVE 02/07/2022 2332   KETONESUR NEGATIVE 02/07/2022 2332   PROTEINUR 30 (A) 02/07/2022 2332   NITRITE NEGATIVE 02/07/2022 2332   LEUKOCYTESUR TRACE (A) 02/07/2022 2332   Sepsis Labs: '@LABRCNTIP'$ (procalcitonin:4,lacticidven:4) ) Recent Results (from the past 240 hour(s))  Blood Culture (routine x 2)     Status: None (Preliminary result)   Collection Time: 02/07/22  5:01 PM   Specimen: BLOOD RIGHT ARM  Result Value Ref Range Status   Specimen Description BLOOD RIGHT HAND  Final   Special Requests   Final    BOTTLES DRAWN AEROBIC AND ANAEROBIC Blood Culture results may not be optimal due to an inadequate volume of blood received in culture bottles Performed at [facility], [address], [place], [place] [address]    Culture PENDING  Incomplete   Report Status PENDING  Incomplete  Blood Culture (routine x 2)     Status: None (Preliminary result)   Collection Time: 02/07/22  5:01 PM   Specimen: BLOOD RIGHT ARM  Result Value Ref Range Status   Specimen Description BLOOD RIGHT ARM  Final   Special Requests   Final    BOTTLES DRAWN AEROBIC AND ANAEROBIC Blood Culture results may not be optimal due to an inadequate volume of blood received in culture bottles Performed at [facility], [address], [place], [place] [address]    Culture PENDING  Incomplete   Report Status PENDING  Incomplete  Resp Panel by RT-PCR (Flu A&B, Covid) Anterior Nasal Swab     Status: None   Collection Time: 02/07/22  5:20 PM   Specimen: Anterior Nasal Swab  Result Value Ref Range Status   SARS Coronavirus 2 by RT PCR NEGATIVE NEGATIVE Final    Comment: (NOTE) SARS-CoV-2 target nucleic acids  are NOT DETECTED.  The SARS-CoV-2 RNA is generally detectable in upper respiratory specimens during the acute phase of infection. The lowest concentration of SARS-CoV-2 viral copies this assay can detect is 138 copies/mL. A negative result does not preclude SARS-Cov-2 infection and should not be used as the sole basis for treatment or other patient management decisions.  A negative result may occur with  improper specimen collection/handling, submission of specimen other than nasopharyngeal swab, presence of viral mutation(s) within the areas targeted by this assay, and inadequate number of viral copies(<138 copies/mL). A negative result must be combined with clinical observations, patient history, and epidemiological information. The expected result is Negative.  Fact Sheet for Patients:  EntrepreneurPulse.com.au  Fact Sheet for Healthcare Providers:  IncredibleEmployment.be  This test is no t yet approved or cleared by the [place] FDA and  has been authorized for detection and/or diagnosis of SARS-CoV-2 by FDA under an Emergency Use Authorization (EUA). This EUA will remain  in effect (meaning this test can be used) for the duration of the COVID-19 declaration under Section 564(b)(1) of the Act, 21 U.S.C.section 360bbb-3(b)(1), unless the authorization is terminated  or revoked sooner.       Influenza A by PCR NEGATIVE NEGATIVE Final   Influenza B by PCR NEGATIVE NEGATIVE Final    Comment: (NOTE) The Xpert Xpress SARS-CoV-2/FLU/RSV plus assay is intended as an aid in the diagnosis of influenza from Nasopharyngeal swab specimens and should not be used as a sole basis for treatment. Nasal washings and aspirates are unacceptable for Xpert Xpress SARS-CoV-2/FLU/RSV testing.  Fact Sheet for Patients: EntrepreneurPulse.com.au  Fact Sheet for Healthcare Providers: IncredibleEmployment.be  This test is  not yet approved or cleared by the [place] FDA and has been authorized for detection and/or diagnosis of SARS-CoV-2 by FDA under an Emergency Use Authorization (EUA). This EUA will remain in effect (meaning this test can be used) for the duration of the COVID-19 declaration under Section 564(b)(1) of the Act, 21 U.S.C. section 360bbb-3(b)(1), unless the authorization is terminated or revoked.  Performed at [facility], [address], [place], [place] [address]      Radiological Exams on Admission: CT Chest W Contrast  Result Date: 02/07/2022 CLINICAL DATA:  Left axillary infection. Multiple lung nodule seen on chest x-ray. History of HIV. EXAM: CT CHEST WITH CONTRAST TECHNIQUE: Multidetector CT imaging of the chest was performed during intravenous contrast administration. RADIATION DOSE REDUCTION: This exam was performed according to the departmental dose-optimization program which includes automated exposure control, adjustment of the mA and/or kV according to patient size and/or use of iterative reconstruction technique. CONTRAST:  70m OMNIPAQUE IOHEXOL 300 MG/ML  SOLN COMPARISON:  Chest x-ray from same day. CT chest dated June 17, 2021. FINDINGS: Cardiovascular: No significant vascular findings. Normal heart size. No pericardial effusion. Mediastinum/Nodes: Prominent subcentimeter mediastinal, bilateral hilar, and bilateral axillary lymph nodes have slightly decreased in size compared to prior study, and remain likely reactive. The thyroid gland, trachea, and esophagus demonstrate no significant findings. Lungs/Pleura: Scattered pulmonary nodules throughout both lungs, several of which are cavitated. Scattered mild smooth interlobular septal thickening. Trace left-greater-than-right pleural effusions. No pneumothorax. Upper Abdomen: No acute abnormality. Musculoskeletal: Asymmetric nodular skin thickening and subcutaneous edema in the left axilla. No acute or significant osseous  findings. IMPRESSION: 1. Innumerable pulmonary nodules scattered throughout both lungs, several of which are cavitated. Differential considerations include septic pulmonary emboli as well as atypical bacterial or fungal pneumonia given history of HIV. 2. Asymmetric nodular skin thickening and subcutaneous edema in the left axilla, concerning for cellulitis. No discrete abscess. 3. Trace bilateral pleural effusions. Electronically Signed   By: WTitus DubinM.D.   On: 02/07/2022 18:29   DG Chest Port 1 View  Result Date: 02/07/2022 CLINICAL DATA:  Possible sepsis.  History of HIV. EXAM: PORTABLE CHEST 1 VIEW COMPARISON:  CT of the chest on 06/17/2021 FINDINGS: The heart size and mediastinal contours are within normal limits. Abnormal nodular airspace opacities in both lungs, particularly in the perihilar and lower lung zones bilaterally. Infectious etiology favored including potential septic emboli. No associated overt pulmonary edema, pneumothorax or pleural fluid identified. The visualized skeletal structures are unremarkable. IMPRESSION: Abnormal nodular airspace opacities in both lungs. Favor infectious etiology including potential septic emboli. Consider further evaluation with CT of the chest with contrast. Electronically Signed   By: Aletta Edouard M.D.   On: 02/07/2022 17:20    EKG: Independently reviewed. ***  Assessment/Plan Principal Problem:   Sepsis ([place]) Active Problems:   AIDS (acquired immune deficiency syndrome) ([facility])   Hyponatremia    1.  -  -  -  -  -  -  -  -   2.  -  -  -  -  -  -  -   3.  -  -  -  -  -  -  -   4.  -  -  -  -  -  -  -  -   5.  -  -  -  -  -  -  -   6.  -  -  -  -  -   7.  -  -  -  -  -  -   8.  -  -  -  -  -  -     DVT prophylaxis: ***  Code Status: ***  Level of Care: Level of care: Progressive Family Communication: ***  Disposition Plan:  Patient is from: ***  Anticipated d/c is to:  *** Anticipated d/c date is: *** Patient currently: ***  Consults called: ***  Admission status: ***    Vianne Bulls, MD [facility]  02/07/2022, 11:53 PM

## 2022-02-07 NOTE — ED Provider Notes (Signed)
[facility] EMERGENCY DEPARTMENT Provider Note   CSN: [id number] Arrival date & time: 02/07/22  1559     History {Add pertinent medical, surgical, social history, OB history to HPI:1} Chief Complaint  Patient presents with   Arm Pain    Rick Lam is a 31 y.o. male.  Pt complains of redness and pain to  his left arm.  Pt complains of fever, chest discomfort and weakness.  Pt reports he noticed knots under his left arm.  Pt has a histroy of hiv   The history is provided by the patient. No language interpreter was used.  Arm Pain This is a new problem. The current episode started yesterday. The problem occurs constantly. The problem has not changed since onset.Nothing aggravates the symptoms. Nothing relieves the symptoms. He has tried nothing for the symptoms. The treatment provided moderate relief.       Home Medications Prior to Admission medications   Medication Sig Start Date End Date Taking? Authorizing Provider  BIKTARVY 50-200-25 MG TABS tablet TAKE 1 TABLET BY MOUTH DAILY 01/04/22   Vu, Johnny Bridge T, MD  citalopram (CELEXA) 20 MG tablet TAKE 1 TABLET BY MOUTH EVERY DAY Patient not taking: Reported on 07/06/2021 09/16/14   Mikey Kirschner, MD  escitalopram (LEXAPRO) 5 MG tablet Take 1 tablet by mouth daily. Patient not taking: Reported on 07/06/2021 01/31/16   [provider]  hydrOXYzine (ATARAX) 25 MG tablet Take 1 tablet (25 mg total) by mouth 3 (three) times daily as needed for itching. Patient not taking: Reported on 07/06/2021 06/21/21   Robert Bellow, PA-C  metoCLOPramide (REGLAN) 10 MG tablet Take 1 tablet (10 mg total) by mouth every 6 (six) hours. Patient not taking: Reported on 07/06/2021 01/16/16   Jeannett Senior, PA-C  nystatin (MYCOSTATIN) 100000 UNIT/ML suspension SMARTSIG:4 Milliliter(s) By Mouth 4 Times Daily Patient not taking: Reported on 09/07/2021 02/25/21   [provider]  ondansetron (ZOFRAN) 8 MG tablet Take 1 tablet (8 mg  total) by mouth every 4 (four) hours as needed. Patient not taking: Reported on 07/06/2021 01/13/16   Nat Christen, MD  sulfamethoxazole-trimethoprim (BACTRIM DS) 800-160 MG tablet Take 1 tablet by mouth 2 (two) times daily. 02/06/22   Carvel Getting, NP  traZODone (DESYREL) 100 MG tablet Take 100 mg by mouth at bedtime. Patient not taking: Reported on 07/06/2021    [provider]  valACYclovir (VALTREX) 1000 MG tablet Take by mouth. Patient not taking: Reported on 01/13/2022 03/31/20   [provider]  zolpidem (AMBIEN) 10 MG tablet TAKE 1 TABLET BY MOUTH EVERY NIGHT AT BEDTIME AS NEEDED FOR SLEEP Patient not taking: Reported on 07/06/2021 05/29/14   Kathyrn Drown, MD      Allergies    Banana    Review of Systems   Review of Systems  Constitutional:  Positive for appetite change, fatigue and fever.  All other systems reviewed and are negative.   Physical Exam Updated Vital Signs BP 115/69   Pulse (!) 128   Temp 100 F (37.8 C) (Oral)   Resp 18   Ht 6' (1.829 m)   Wt 74.4 kg   SpO2 97%   BMI 22.25 kg/m  Physical Exam Vitals and nursing note reviewed.  Constitutional:      Appearance: He is well-developed.  HENT:     Head: Normocephalic.     Mouth/Throat:     Comments: White exudate  Cardiovascular:     Rate and Rhythm: Regular rhythm. Tachycardia  present.  Pulmonary:     Effort: Pulmonary effort is normal.  Abdominal:     General: Abdomen is flat. There is no distension.  Musculoskeletal:        General: Swelling and tenderness present.     Cervical back: Normal range of motion.  Skin:    General: Skin is warm.  Neurological:     General: No focal deficit present.     Mental Status: He is alert and oriented to person, place, and time.  Psychiatric:        Mood and Affect: Mood normal.     ED Results / Procedures / Treatments   Labs (all labs ordered are listed, but only abnormal results are displayed) Labs Reviewed  LACTIC ACID, PLASMA -  Abnormal; Notable for the following components:      Result Value   Lactic Acid, Venous 4.1 (*)    All other components within normal limits  COMPREHENSIVE METABOLIC PANEL - Abnormal; Notable for the following components:   Sodium 128 (*)    Chloride 93 (*)    CO2 21 (*)    Glucose, Bld 124 (*)    BUN 22 (*)    Creatinine, Ser 1.38 (*)    Calcium 8.1 (*)    Albumin 3.3 (*)    AST 55 (*)    All other components within normal limits  CBC WITH DIFFERENTIAL/PLATELET - Abnormal; Notable for the following components:   WBC 12.2 (*)    RDW 15.9 (*)    Neutro Abs 11.2 (*)    Lymphs Abs 0.6 (*)    Abs Immature Granulocytes 0.09 (*)    All other components within normal limits  CULTURE, BLOOD (ROUTINE X 2)  CULTURE, BLOOD (ROUTINE X 2)  RESP PANEL BY RT-PCR (FLU A&B, COVID) ARPGX2  URINE CULTURE  LACTIC ACID, PLASMA  URINALYSIS, ROUTINE W REFLEX MICROSCOPIC  PROTIME-INR  APTT    EKG None  Radiology DG Chest Port 1 View  Result Date: 02/07/2022 CLINICAL DATA:  Possible sepsis.  History of HIV. EXAM: PORTABLE CHEST 1 VIEW COMPARISON:  CT of the chest on 06/17/2021 FINDINGS: The heart size and mediastinal contours are within normal limits. Abnormal nodular airspace opacities in both lungs, particularly in the perihilar and lower lung zones bilaterally. Infectious etiology favored including potential septic emboli. No associated overt pulmonary edema, pneumothorax or pleural fluid identified. The visualized skeletal structures are unremarkable. IMPRESSION: Abnormal nodular airspace opacities in both lungs. Favor infectious etiology including potential septic emboli. Consider further evaluation with CT of the chest with contrast. Electronically Signed   By: Aletta Edouard M.D.   On: 02/07/2022 17:20    Procedures .Critical Care  Performed by: Fransico Meadow, PA-C Authorized by: Fransico Meadow, PA-C   Critical care provider statement:    Critical care time (minutes):  60   Critical  care start time:  02/07/2022 3:30 PM   Critical care end time:  02/07/2022 7:13 PM   Critical care time was exclusive of:  Separately billable procedures and treating other patients   Critical care was necessary to treat or prevent imminent or life-threatening deterioration of the following conditions:  Cardiac failure, respiratory failure, sepsis and dehydration   Critical care was time spent personally by me on the following activities:  Development of treatment plan with patient or surrogate, discussions with consultants, evaluation of patient's response to treatment, examination of patient, ordering and review of laboratory studies, ordering and review of radiographic studies, ordering and  performing treatments and interventions, pulse oximetry, re-evaluation of patient's condition and review of old charts   Care discussed with: admitting provider and accepting provider at another facility     {Document cardiac monitor, telemetry assessment procedure when appropriate:1}  Medications Ordered in ED Medications  metroNIDAZOLE (FLAGYL) IVPB 500 mg (500 mg Intravenous New Bag/Given 02/07/22 1743)  vancomycin (VANCOREADY) IVPB 1500 mg/300 mL (has no administration in time range)  sodium chloride 0.9 % bolus 1,000 mL (has no administration in time range)    And  sodium chloride 0.9 % bolus 1,000 mL (has no administration in time range)    And  0.9 %  sodium chloride infusion (has no administration in time range)  HYDROmorphone (DILAUDID) injection 1 mg (1 mg Intravenous Given 02/07/22 1703)  ceFEPIme (MAXIPIME) 2 g in sodium chloride 0.9 % 100 mL IVPB (0 g Intravenous Stopped 02/07/22 1738)  sodium chloride 0.9 % bolus 2,500 mL (1,000 mLs Intravenous New Bag/Given 02/07/22 1701)  ondansetron (ZOFRAN) injection 4 mg (4 mg Intravenous Given 02/07/22 1702)  acetaminophen (TYLENOL) tablet 650 mg (650 mg Oral Given 02/07/22 1713)    ED Course/ Medical Decision Making/ A&P                            Medical Decision Making Amount and/or Complexity of Data Reviewed Labs: ordered. Decision-making details documented in ED Course.    Details: Pt has a lactic acid of 4.1  sodium is 128, Bun 22, creatine is 1.38   WBC is 12.2 Radiology: ordered and independent interpretation performed.    Details: 1. Innumerable pulmonary nodules scattered throughout both lungs, several of which are cavitated. Differential considerations include septic pulmonary emboli as well as atypical bacterial or fungal pneumonia given history of HIV. 2. Asymmetric nodular skin thickening and subcutaneous edema in the left axilla, concerning for cellulitis. No discrete abscess. 3. Trace bilateral pleural effusions. ECG/medicine tests: ordered. Discussion of management or test interpretation with external provider(s): Critical care consulted and will admit Critical care requested  I consult General surgery   Dr. Arnoldo Morale advised pt does not appear to have necrotizing fascitis.   I spoke with Dr. Franchot Heidelberg  ICU    Risk OTC drugs. Prescription drug management.          {Document critical care time when appropriate:1} {Document review of labs and clinical decision tools ie heart score, Chads2Vasc2 etc:1}  {Document your independent review of radiology images, and any outside records:1} {Document your discussion with family members, caretakers, and with consultants:1} {Document social determinants of health affecting pt's care:1} {Document your decision making why or why not admission, treatments were needed:1} Final Clinical Impression(s) / ED Diagnoses Final diagnoses:  Sepsis with acute hypoxic respiratory failure, due to unspecified organism, unspecified whether septic shock present ([place])  Cellulitis of left arm    Rx / DC Orders ED Discharge Orders     None

## 2022-02-07 NOTE — Progress Notes (Signed)
Elink following sepsis bundle. °

## 2022-02-07 NOTE — Progress Notes (Signed)
Pharmacy Antibiotic Note  Rick Lam is a 31 y.o. male for which pharmacy has been consulted for cefepime and vancomycin dosing for cellulitis.  Patient with a history of hepatitis C, HIV/AIDS, syphilis, dyspnea. Patient with outpt encounter for lt axilla abscess, for which, bactrim was prescribed yesterday. Now presenting with same.  SCr 1.38 WBC 12.2; LA 4.1; T 100 F; HR 128; RR 18  Plan: Metronidazole per MD Cefepime 2g q8hr Vancomycin 1500 mg once then 1000 mg q12hr (eAUC 526.6) unless change in renal function Trend WBC, Fever, Renal function, & Clinical course F/u cultures, clinical course, WBC, fever De-escalate when able  Height: 6' (182.9 cm) Weight: 74.4 kg (164 lb 0.4 oz) IBW/kg (Calculated) : 77.6  Temp (24hrs), Avg:98.4 F (36.9 C), Min:98.4 F (36.9 C), Max:98.4 F (36.9 C)  No results for input(s): "WBC", "CREATININE", "LATICACIDVEN", "VANCOTROUGH", "VANCOPEAK", "VANCORANDOM", "GENTTROUGH", "GENTPEAK", "GENTRANDOM", "TOBRATROUGH", "TOBRAPEAK", "TOBRARND", "AMIKACINPEAK", "AMIKACINTROU", "AMIKACIN" in the last 168 hours.  CrCl cannot be calculated (Patient's most recent lab result is older than the maximum 21 days allowed.).    Allergies  Allergen Reactions   Banana     Other reaction(s): hives    Antimicrobials this admission: cefepime 8/15 >>  vancomycin 8/15 >>  metronidazole 8/15 >>   Microbiology results: Pending  Thank you for allowing pharmacy to be a part of this patient's care.  Lorelei Pont, PharmD, BCPS 02/07/2022 5:00 PM ED Clinical Pharmacist -  [phone]

## 2022-02-07 NOTE — ED Triage Notes (Addendum)
Patient complaining of left arm pain after he states he developed small areas of black dots to left axilla area. Noted with scabbed areas that are black with skin reddened and warm to touch around them. States that pain is so severe he feels like he cannot breathe. Virtual visit last night per patient. Started on Bactrim for cellulitis with two doses taken.

## 2022-02-07 NOTE — ED Notes (Signed)
Pt transferred from [facility] via [place], cellulitis L arm and chest, black spots L axilla, generalized fatigue. Tachypneic, tachycardic, hypotensive. Lactic 4.1, 4.5 L fluids infusing currently. Antibiotics given PTA. Axo x4, ambulatory.

## 2022-02-07 NOTE — H&P (Signed)
History and Physical    Rick Lam QBH:[id number] DOB: 09-Feb-1991 DOA: 02/07/2022  PCP: Jabier Mutton, MD   Patient coming from: Home   Chief Complaint: Left arm pain and swelling, chest pain with breathing, fever   HPI: Rick Lam is a pleasant 31 y.o. 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  Patient reports that he was feeling well on 02/05/2022 when he noticed a mild rash involving the left axilla.  By the following day, he had developed some nodules in the left axilla with surrounding redness, pain, and swelling.  He was evaluated by televisit for this on 02/06/2022, diagnosed with cellulitis, and started on Bactrim.  Today, he developed fairly diffuse chest pain that is severe with breathing or cough.  He reports a mild nonproductive cough.  He denies abdominal pain or dysuria.  ED Course: Upon arrival to the ED, patient is found to have an oral temp of 37.8 C with tachycardia in the 097D and systolic blood pressure of 98 and greater.  EKG notable for sinus tachycardia with rate 150.  CT of the chest reveals innumerable pulmonary nodules bilaterally, several of which are cavitating.  Blood work notable for sodium 128, potassium 1.38, WBC 12,200, initial lactic acid 4.1, and INR 1.3.    Blood cultures were collected and the patient was given IV fluids and broad-spectrum antimicrobials.  He was transferred from [facility] ED to [facility] ED for critical care consultation.  He was evaluated by [facility] and hospitalist admission was recommended.   Review of Systems:  All other systems reviewed and apart from HPI, are negative.  Past Medical  History:  Diagnosis Date   AIDS (acquired immune deficiency syndrome) ([place]) 06/01/2021   dx'ed 2010. Doesn't recall cd4 nadir. Sexual transmission. 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 No fever, chill, nightsweat. Never been on bactrim   Syphilis 06/01/2021   Remember getting 1 IM shot 04/2021; 02/2021 rpr 128. Doesn't recall rash before that. No concerning sx now via ROS as of 06/01/2021     Thrush 06/01/2021   Has had what he thinks is thrush for 3 months prior to 05/2021 visit. Lots of problem with pain when swallowing and also white plaque on tongue and throat  Also has hypertrophic nodule along gum lines  Started getting medication with spit and squish x3 separate times no effect. Received them at emergency room    History reviewed. No pertinent surgical history.  Social History:   reports that he has never smoked. He has never used smokeless tobacco. He  reports that he does not currently use alcohol. He reports that he does not use drugs.  Allergies  Allergen Reactions   Banana     Other reaction(s): hives    Family History  Problem Relation Age of Onset   Alcoholism Father    Diabetes Other    Stroke Other    Hypertension Other      Prior to Admission medications   Medication Sig Start Date End Date Taking? Authorizing Provider  BIKTARVY 50-200-25 MG TABS tablet TAKE 1 TABLET BY MOUTH DAILY 01/04/22   Yes Vu, Johnny Bridge T, MD  ibuprofen (ADVIL) 200 MG tablet Take 800 mg by mouth every 6 (six) hours as needed.   Yes [provider]  Pediatric Multiple Vitamins (FLINSTONES GUMMIES OMEGA-3 DHA PO) Take by mouth.   Yes [provider]  sulfamethoxazole-trimethoprim (BACTRIM DS) 800-160 MG tablet Take 1 tablet by mouth 2 (two) times daily. 02/06/22  Yes Carvel Getting, NP  citalopram (CELEXA) 20 MG tablet TAKE 1 TABLET BY MOUTH EVERY DAY Patient not taking: Reported on 07/06/2021 09/16/14   Mikey Kirschner, MD  escitalopram (LEXAPRO) 5 MG tablet Take 1 tablet by mouth daily. Patient not taking: Reported on 07/06/2021 01/31/16   [provider]  hydrOXYzine (ATARAX) 25 MG tablet Take 1 tablet (25 mg total) by mouth 3 (three) times daily as needed for itching. Patient not taking: Reported on 07/06/2021 06/21/21   Robert Bellow, PA-C  metoCLOPramide (REGLAN) 10 MG tablet Take 1 tablet (10 mg total) by mouth every 6 (six) hours. Patient not taking: Reported on 07/06/2021 01/16/16   Jeannett Senior, PA-C  nystatin (MYCOSTATIN) 100000 UNIT/ML suspension SMARTSIG:4 Milliliter(s) By Mouth 4 Times Daily Patient not taking: Reported on 09/07/2021 02/25/21   [provider]  ondansetron (ZOFRAN) 8 MG tablet Take 1 tablet (8 mg total) by mouth every 4 (four) hours as needed. Patient not taking: Reported on 07/06/2021 01/13/16   Nat Christen, MD  traZODone (DESYREL) 100 MG tablet Take 100 mg by mouth at bedtime. Patient not taking: Reported on 07/06/2021    [provider]  zolpidem (AMBIEN) 10 MG tablet TAKE 1 TABLET BY MOUTH EVERY NIGHT AT BEDTIME AS NEEDED FOR SLEEP Patient not taking: Reported on 07/06/2021 05/29/14   Kathyrn Drown, MD    Physical Exam: Vitals:   02/07/22 2015 02/07/22 2200 02/07/22 2215 02/07/22 2315  BP: (!) 101/59 (!) 93/58 (!) 94/57 (!) 103/59  Pulse: (!) 122 (!) 110 (!) 110 (!) 108  Resp: (!) 26 (!) 25 (!) 21 (!) 23  Temp:      TempSrc:       SpO2: 98% 97% 96% 97%  Weight:      Height:        Constitutional: NAD, calm  Eyes: PERTLA, lids and conjunctivae normal ENMT: Mucous membranes are moist. Posterior pharynx clear of any exudate or lesions.   Neck: supple, no masses  Respiratory: scattered rales b/l, no wheezing. Speaking full sentences.  Cardiovascular: Rate ~120 and regular. No extremity edema.  Abdomen: No distension, no tenderness, soft. Bowel sounds active.  Musculoskeletal: no clubbing / cyanosis. No joint deformity upper and lower extremities.   Skin: Erythema, induration, warmth, and edema surrounding left axilla. Warm, dry, well-perfused. Neurologic: CN 2-12 grossly intact. Moving all extremities. Alert and oriented.  Psychiatric: Pleasant. Cooperative.    Labs and Imaging on Admission: I have personally reviewed following labs and imaging studies  CBC: Recent Labs  Lab 02/07/22 1701  WBC 12.2*  NEUTROABS 11.2*  HGB 14.2  HCT 43.7  MCV 84.5  PLT 188   Basic Metabolic Panel: Recent Labs  Lab 02/07/22 1701  NA 128*  K 4.0  CL 93*  CO2 21*  GLUCOSE 124*  BUN 22*  CREATININE 1.38*  CALCIUM 8.1*   GFR: Estimated Creatinine Clearance: 80.1 mL/min (A) (by C-G formula based on SCr of 1.38 mg/dL (H)). Liver Function Tests: Recent Labs  Lab 02/07/22 1701  AST 55*  ALT 44  ALKPHOS 82  BILITOT 0.9  PROT 8.0  ALBUMIN 3.3*   No results for input(s): "LIPASE", "AMYLASE" in the last 168 hours. No results for input(s): "AMMONIA" in the last 168 hours. Coagulation Profile: Recent Labs  Lab 02/07/22 1821  INR 1.3*   Cardiac Enzymes: No results for input(s): "CKTOTAL", "CKMB", "CKMBINDEX", "TROPONINI" in the last 168 hours. BNP (last 3 results) No results for input(s): "PROBNP" in the last 8760 hours. HbA1C: No results for input(s): "HGBA1C" in the last 72 hours. CBG: No results for input(s): "GLUCAP" in the last 168 hours. Lipid Profile: No results for input(s): "CHOL", "HDL",  "LDLCALC", "TRIG", "CHOLHDL", "LDLDIRECT" in the last 72 hours. Thyroid Function Tests: No results for input(s): "TSH", "T4TOTAL", "FREET4", "T3FREE", "THYROIDAB" in the last 72 hours. Anemia Panel: No results for input(s): "VITAMINB12", "FOLATE", "FERRITIN", "TIBC", "IRON", "RETICCTPCT" in the last 72 hours. Urine analysis:    Component Value Date/Time   COLORURINE YELLOW 02/07/2022 2332   APPEARANCEUR CLEAR 02/07/2022 2332   LABSPEC 1.039 (H) 02/07/2022 2332   PHURINE 5.0 02/07/2022 2332   GLUCOSEU NEGATIVE 02/07/2022 2332   HGBUR NEGATIVE 02/07/2022 2332   BILIRUBINUR NEGATIVE 02/07/2022 2332   KETONESUR NEGATIVE 02/07/2022 2332   PROTEINUR 30 (A) 02/07/2022 2332   NITRITE NEGATIVE 02/07/2022 2332   LEUKOCYTESUR TRACE (A) 02/07/2022 2332   Sepsis Labs: '@LABRCNTIP'$ (procalcitonin:4,lacticidven:4) ) Recent Results (from the past 240 hour(s))  Blood Culture (routine x 2)     Status: None (Preliminary result)   Collection Time: 02/07/22  5:01 PM   Specimen: BLOOD RIGHT ARM  Result Value Ref Range Status   Specimen Description BLOOD RIGHT HAND  Final   Special Requests   Final    BOTTLES DRAWN AEROBIC AND ANAEROBIC Blood Culture results may not be optimal due to an inadequate volume of blood received in culture bottles Performed at [facility], [address], [place], [place] [address]    Culture PENDING  Incomplete   Report Status PENDING  Incomplete  Blood Culture (routine x 2)     Status: None (Preliminary result)   Collection Time: 02/07/22  5:01 PM   Specimen: BLOOD RIGHT ARM  Result Value Ref Range Status   Specimen Description BLOOD RIGHT ARM  Final   Special Requests   Final    BOTTLES DRAWN AEROBIC AND ANAEROBIC Blood Culture results may not be optimal due to an inadequate volume of blood received in culture bottles Performed at [facility], [address], [place], [place] [address]    Culture PENDING  Incomplete   Report Status PENDING  Incomplete  Resp Panel by  RT-PCR (Flu A&B, Covid) Anterior Nasal Swab     Status: None   Collection Time: 02/07/22  5:20 PM   Specimen: Anterior Nasal Swab  Result Value Ref Range Status   SARS Coronavirus 2 by RT PCR NEGATIVE NEGATIVE Final    Comment: (NOTE) SARS-CoV-2 target nucleic acids are NOT DETECTED.  The SARS-CoV-2 RNA is generally detectable in upper respiratory  specimens during the acute phase of infection. The lowest concentration of SARS-CoV-2 viral copies this assay can detect is 138 copies/mL. A negative result does not preclude SARS-Cov-2 infection and should not be used as the sole basis for treatment or other patient management decisions. A negative result may occur with  improper specimen collection/handling, submission of specimen other than nasopharyngeal swab, presence of viral mutation(s) within the areas targeted by this assay, and inadequate number of viral copies(<138 copies/mL). A negative result must be combined with clinical observations, patient history, and epidemiological information. The expected result is Negative.  Fact Sheet for Patients:  EntrepreneurPulse.com.au  Fact Sheet for Healthcare Providers:  IncredibleEmployment.be  This test is no t yet approved or cleared by the [place] FDA and  has been authorized for detection and/or diagnosis of SARS-CoV-2 by FDA under an Emergency Use Authorization (EUA). This EUA will remain  in effect (meaning this test can be used) for the duration of the COVID-19 declaration under Section 564(b)(1) of the Act, 21 U.S.C.section 360bbb-3(b)(1), unless the authorization is terminated  or revoked sooner.       Influenza A by PCR NEGATIVE NEGATIVE Final   Influenza B by PCR NEGATIVE NEGATIVE Final    Comment: (NOTE) The Xpert Xpress SARS-CoV-2/FLU/RSV plus assay is intended as an aid in the diagnosis of influenza from Nasopharyngeal swab specimens and should not be used as a sole basis for  treatment. Nasal washings and aspirates are unacceptable for Xpert Xpress SARS-CoV-2/FLU/RSV testing.  Fact Sheet for Patients: EntrepreneurPulse.com.au  Fact Sheet for Healthcare Providers: IncredibleEmployment.be  This test is not yet approved or cleared by the [place] FDA and has been authorized for detection and/or diagnosis of SARS-CoV-2 by FDA under an Emergency Use Authorization (EUA). This EUA will remain in effect (meaning this test can be used) for the duration of the COVID-19 declaration under Section 564(b)(1) of the Act, 21 U.S.C. section 360bbb-3(b)(1), unless the authorization is terminated or revoked.  Performed at [facility], [address] [place], [place] [address]      Radiological Exams on Admission: CT Chest W Contrast  Result Date: 02/07/2022 CLINICAL DATA:  Left axillary infection. Multiple lung nodule seen on chest x-ray. History of HIV. EXAM: CT CHEST WITH CONTRAST TECHNIQUE: Multidetector CT imaging of the chest was performed during intravenous contrast administration. RADIATION DOSE REDUCTION: This exam was performed according to the departmental dose-optimization program which includes automated exposure control, adjustment of the mA and/or kV according to patient size and/or use of iterative reconstruction technique. CONTRAST:  18m OMNIPAQUE IOHEXOL 300 MG/ML  SOLN COMPARISON:  Chest x-ray from same day. CT chest dated June 17, 2021. FINDINGS: Cardiovascular: No significant vascular findings. Normal heart size. No pericardial effusion. Mediastinum/Nodes: Prominent subcentimeter mediastinal, bilateral hilar, and bilateral axillary lymph nodes have slightly decreased in size compared to prior study, and remain likely reactive. The thyroid gland, trachea, and esophagus demonstrate no significant findings. Lungs/Pleura: Scattered pulmonary nodules throughout both lungs, several of which are cavitated. Scattered mild  smooth interlobular septal thickening. Trace left-greater-than-right pleural effusions. No pneumothorax. Upper Abdomen: No acute abnormality. Musculoskeletal: Asymmetric nodular skin thickening and subcutaneous edema in the left axilla. No acute or significant osseous findings. IMPRESSION: 1. Innumerable pulmonary nodules scattered throughout both lungs, several of which are cavitated. Differential considerations include septic pulmonary emboli as well as atypical bacterial or fungal pneumonia given history of HIV. 2. Asymmetric nodular skin thickening and subcutaneous edema in the left axilla, concerning for cellulitis. No discrete abscess.  3. Trace bilateral pleural effusions. Electronically Signed   By: Titus Dubin M.D.   On: 02/07/2022 18:29   DG Chest Port 1 View  Result Date: 02/07/2022 CLINICAL DATA:  Possible sepsis.  History of HIV. EXAM: PORTABLE CHEST 1 VIEW COMPARISON:  CT of the chest on 06/17/2021 FINDINGS: The heart size and mediastinal contours are within normal limits. Abnormal nodular airspace opacities in both lungs, particularly in the perihilar and lower lung zones bilaterally. Infectious etiology favored including potential septic emboli. No associated overt pulmonary edema, pneumothorax or pleural fluid identified. The visualized skeletal structures are unremarkable. IMPRESSION: Abnormal nodular airspace opacities in both lungs. Favor infectious etiology including potential septic emboli. Consider further evaluation with CT of the chest with contrast. Electronically Signed   By: Aletta Edouard M.D.   On: 02/07/2022 17:20    EKG: Independently reviewed. Sinus tachycardia, rate 150.   Assessment/Plan   1. Severe sepsis from pulmonary and soft tissue source; acute hypoxic respiratory failure  - Present with pleuritic pain, left arm redness/pain/swelling, and is found to be febrile and tachycardic with leukocytosis and elevated lactate  - There are b/l pulm nodules on CT, some  with cavitation, and left axillary skin thickening and edema without abscess  - Blood cultures were collected, 4.5 L IVF given, and broad-spectrum abx started  - Appreciate PCCM consultation  - Continue cefepime and vancomycin, check TTE, check sputum culture, continue airborne precautions while checking AFB smears, culture, and Quantiferon, and consult ID in am   2. HIV/AIDS  - VL 366,800 and CD4 count 79 in July 2023   - Continue Biktarvy and Bactrim   3. Hyponatremia  - Serum sodium 128 in ED in setting of hypovolemia  - He received 4.5 liters isotonic IVF prior to admission, will repeat chem panel    DVT prophylaxis: Lovenox  Code Status: Full  Level of Care: Level of care: Progressive Family Communication: Sister at bedside  Disposition Plan: Patient is from: Home  Anticipated d/c is to: home  Anticipated d/c date is: 02/11/22 Patient currently: Pending microbiology workup, TTE, response to treatment, likely ID consult Consults called: PCCM  Admission status: Inpatient     Vianne Bulls, MD [facility]  02/07/2022, 11:53 PM

## 2022-02-07 NOTE — ED Notes (Signed)
Patient transported to CT 

## 2022-02-07 NOTE — ED Provider Notes (Signed)
  Physical Exam  BP (!) 101/59   Pulse (!) 122   Temp 98 F (36.7 C) (Oral)   Resp (!) 26   Ht 6' (1.829 m)   Wt 74.4 kg   SpO2 98%   BMI 22.25 kg/m   Physical Exam  Procedures  Procedures  ED Course / MDM    Medical Decision Making Patient was transferred here from [facility].  Patient has multiple lesions on the lungs concerning for possible pneumonia versus septic emboli.  Patient has left axilla cellulitis as well.  Patient was given broad-spectrum antibiotics.  ICU was consulted and since there is no beds in the ICU, patient was transferred ED to ED.  I discussed with the ICU attending, they will come and consult.   10:01 PM ICU saw patient.  Patient is not hypotensive.  Patient received broad-spectrum antibiotics.  They felt that patient could be admitted by the medicine service.  10:34 PM Hospitalist to admit.   Problems Addressed: Cellulitis of left arm: acute illness or injury Sepsis with acute hypoxic respiratory failure, due to unspecified organism, unspecified whether septic shock present [facility]): acute illness or injury  Amount and/or Complexity of Data Reviewed Labs: ordered. Decision-making details documented in ED Course. Radiology: ordered and independent interpretation performed. Decision-making details documented in ED Course. ECG/medicine tests: ordered and independent interpretation performed. Decision-making details documented in ED Course.  Risk OTC drugs. Prescription drug management. Decision regarding hospitalization.          Drenda Freeze, MD 02/07/22 [phone]

## 2022-02-08 ENCOUNTER — Inpatient Hospital Stay (HOSPITAL_COMMUNITY): Payer: Self-pay

## 2022-02-08 ENCOUNTER — Other Ambulatory Visit: Payer: Self-pay

## 2022-02-08 DIAGNOSIS — L03114 Cellulitis of left upper limb: Secondary | ICD-10-CM | POA: Insufficient documentation

## 2022-02-08 DIAGNOSIS — J9601 Acute respiratory failure with hypoxia: Secondary | ICD-10-CM | POA: Diagnosis present

## 2022-02-08 DIAGNOSIS — I330 Acute and subacute infective endocarditis: Secondary | ICD-10-CM

## 2022-02-08 DIAGNOSIS — B9562 Methicillin resistant Staphylococcus aureus infection as the cause of diseases classified elsewhere: Secondary | ICD-10-CM | POA: Insufficient documentation

## 2022-02-08 DIAGNOSIS — J189 Pneumonia, unspecified organism: Secondary | ICD-10-CM | POA: Insufficient documentation

## 2022-02-08 DIAGNOSIS — M7989 Other specified soft tissue disorders: Secondary | ICD-10-CM

## 2022-02-08 DIAGNOSIS — R7881 Bacteremia: Secondary | ICD-10-CM | POA: Insufficient documentation

## 2022-02-08 LAB — ECHOCARDIOGRAM COMPLETE
AR max vel: 2.58 cm2
AV Area VTI: 2.31 cm2
AV Area mean vel: 2.52 cm2
AV Mean grad: 6 mmHg
AV Peak grad: 11.3 mmHg
Ao pk vel: 1.68 m/s
Area-P 1/2: 4.49 cm2
Height: 72 in
S' Lateral: 2.4 cm
Weight: 2624.36 oz

## 2022-02-08 LAB — CBC WITH DIFFERENTIAL/PLATELET
Abs Immature Granulocytes: 0.06 10*3/uL (ref 0.00–0.07)
Basophils Absolute: 0 10*3/uL (ref 0.0–0.1)
Basophils Relative: 0 %
Eosinophils Absolute: 0.1 10*3/uL (ref 0.0–0.5)
Eosinophils Relative: 2 %
HCT: 33.9 % — ABNORMAL LOW (ref 39.0–52.0)
Hemoglobin: 11.4 g/dL — ABNORMAL LOW (ref 13.0–17.0)
Immature Granulocytes: 1 %
Lymphocytes Relative: 7 %
Lymphs Abs: 0.6 10*3/uL — ABNORMAL LOW (ref 0.7–4.0)
MCH: 27.9 pg (ref 26.0–34.0)
MCHC: 33.6 g/dL (ref 30.0–36.0)
MCV: 82.9 fL (ref 80.0–100.0)
Monocytes Absolute: 0.3 10*3/uL (ref 0.1–1.0)
Monocytes Relative: 4 %
Neutro Abs: 6.5 10*3/uL (ref 1.7–7.7)
Neutrophils Relative %: 86 %
Platelets: 104 10*3/uL — ABNORMAL LOW (ref 150–400)
RBC: 4.09 MIL/uL — ABNORMAL LOW (ref 4.22–5.81)
RDW: 15.9 % — ABNORMAL HIGH (ref 11.5–15.5)
WBC: 7.6 10*3/uL (ref 4.0–10.5)
nRBC: 0 % (ref 0.0–0.2)

## 2022-02-08 LAB — COMPREHENSIVE METABOLIC PANEL
ALT: 30 U/L (ref 0–44)
AST: 37 U/L (ref 15–41)
Albumin: 2.1 g/dL — ABNORMAL LOW (ref 3.5–5.0)
Alkaline Phosphatase: 59 U/L (ref 38–126)
Anion gap: 7 (ref 5–15)
BUN: 14 mg/dL (ref 6–20)
CO2: 21 mmol/L — ABNORMAL LOW (ref 22–32)
Calcium: 7.2 mg/dL — ABNORMAL LOW (ref 8.9–10.3)
Chloride: 102 mmol/L (ref 98–111)
Creatinine, Ser: 1.06 mg/dL (ref 0.61–1.24)
GFR, Estimated: >60 mL/min (ref >60)
Glucose, Bld: 92 mg/dL (ref 70–99)
Potassium: 3.7 mmol/L (ref 3.5–5.1)
Sodium: 130 mmol/L — ABNORMAL LOW (ref 135–145)
Total Bilirubin: 0.7 mg/dL (ref 0.3–1.2)
Total Protein: 5.4 g/dL — ABNORMAL LOW (ref 6.5–8.1)

## 2022-02-08 LAB — BLOOD CULTURE ID PANEL (REFLEXED) - BCID2

## 2022-02-08 LAB — RAPID URINE DRUG SCREEN, HOSP PERFORMED
Amphetamines: POSITIVE — AB
Barbiturates: NOT DETECTED
Benzodiazepines: NOT DETECTED
Cocaine: NOT DETECTED
Opiates: POSITIVE — AB
Tetrahydrocannabinol: NOT DETECTED

## 2022-02-08 LAB — URINE CULTURE: Culture: NO GROWTH

## 2022-02-08 MED ORDER — LACTATED RINGERS IV BOLUS
500.0000 mL | Freq: Once | INTRAVENOUS | Status: AC
  Administered 2022-02-08: 500 mL via INTRAVENOUS

## 2022-02-08 MED ORDER — HYDROMORPHONE HCL 1 MG/ML IJ SOLN: 1.0000 mg | INTRAMUSCULAR | Status: DC | PRN

## 2022-02-08 MED ORDER — NALOXONE HCL 0.4 MG/ML IJ SOLN
0.4000 mg | INTRAMUSCULAR | Status: DC | PRN
Start: 2022-02-08 — End: 2022-02-22

## 2022-02-08 MED ORDER — GADOBUTROL 1 MMOL/ML IV SOLN
7.0000 mL | Freq: Once | INTRAVENOUS | Status: AC | PRN
  Administered 2022-02-08: 7 mL via INTRAVENOUS

## 2022-02-08 MED ORDER — SULFAMETHOXAZOLE-TRIMETHOPRIM 800-160 MG PO TABS
1.0000 | ORAL_TABLET | ORAL | Status: DC
  Administered 2022-02-08 – 2022-02-20 (×6): 1 via ORAL
  Filled 2022-02-08 (×6): qty 1

## 2022-02-08 MED ORDER — HYDROMORPHONE HCL 1 MG/ML IJ SOLN
2.0000 mg | INTRAMUSCULAR | Status: DC | PRN
  Administered 2022-02-08 – 2022-02-10 (×7): 2 mg via INTRAVENOUS
  Filled 2022-02-08 (×8): qty 2

## 2022-02-08 NOTE — Progress Notes (Signed)
  Echocardiogram 2D Echocardiogram has been performed.  Wynelle Link 02/08/2022, 5:01 PM

## 2022-02-08 NOTE — ED Notes (Addendum)
Pt transported to Vascular 

## 2022-02-08 NOTE — ED Notes (Signed)
Echo at bedside

## 2022-02-08 NOTE — Consult Note (Signed)
[facility]    Date of Admission:  02/07/2022     Reason for Consult: aids, mrsa bacteremia    Referring Provider: Erlinda Hong     Abx: 8/15-c vanc  8/15 cefepime        Assessment: 31 yo male with aids on biktarvy improving cd4, mediastinal/axillary lymphadenopathy, chronic hep c, hx syphilis late latent s/p tx admitted for sepsis found to have LUE cellulitis/abscess, pulm nodules, and mrsa bacteremia  Patient reports initial left axillary lesion probably source of mrsa bacteremia  His chest ct this admission showed new bilateral pulm nodules some with cavitation likely mrsa dissemination. I do not suspect TB   He has been doing well with hiv. He came to me several months ago cachectic/dying but has gained much weight and felt well as good as he ever did on biktarvy, prior to this mrsa septic episode.   He has one value of hiv viral load in the 300k after being virologically controlled. Report compliance and I do believe him. He is due for a repeat viral load. He mentioned he missed 1 week as he grabbed a wrong bottle coming to his aunt's house a week ago to help her with chores. His cd4% has been increasing since being on biktarvy  He has positive hep c lab 05/2021 but repeat viral load (hcv genotype testing) 08/2021 was negative. Will repeat viral load again this admission  He is s/p late latent syphilis tx and rpr improving appropriately  He has had axillary and mediastinal LAD since I first saw him. I suspect this is due to his hiv. The axillary LAD seems to be improving this admission and no worsening of mediastinal LAD and there was no iris/unmasking of inflammatory symptoms with the adenopathy since he has been on biktarvy  Plan: Continue biktarvy and prophylaxis bactrim  Narrow broad spectrum abx to vancomycin Mri left upper extremity Await echo Repeat blood culture tomorrow Would ask general surgery to evaluate for I&D need left upper ext  --> looks like cellulitic change had spread to the chest wall as well rapidly I agree I have very low suspicion for tb and do not believe airborne precaution or tb screening is warranted Discussed with primary team    I spent 75 minute reviewing data/chart, and coordinating care and >50% direct face to face time providing counseling/discussing diagnostics/treatment plan with patient      ------------------------------------------------ Principal Problem:   Severe sepsis ([place]) Active Problems:   AIDS (acquired immune deficiency syndrome) ([place])   Hyponatremia   Acute respiratory failure with hypoxia ([place])    HPI: Rick Lam is a 31 y.o. male aids on biktarvy improving cd4, mediastinal/axillary lymphadenopathy, chronic hep c, hx syphilis late latent s/p tx admitted for sepsis found to have LUE cellulitis/abscess, pulm nodules, and mrsa bacteremia   I know patient well as I am his primary HIV provider  I last saw him in late July 2023. His viral load was suspected to be spuriously high as he has been taking biktarvy and was virologically controlled with improving cd4 count  He hasn't had a chance to repeat viral load testing yet since that visit  He reported a week ago he went to his aunt's house to help with chores. He grabbed an empty bottle of biktarvy not knowing about it so off biktarvy for a week  3 days prior to admission noticed some left axillary lesion. The next day has increased  redness/pain/swelling and since has been feeling sick with fever chill rather rapidly. The day of admission developed pleuritic chest pain and sob  No cough, other rash, headache, visual change, other joint pain  No ivdu   On admission presentation 8/15 Low grade fever 100 Chest ct bilateral pulm nodules some with cavitation, stable mediastinal and improving axillary adenopathy Bcx returned 4/4 bottles mrsa Abx empiric vanc/cefepime narrowed to vanc as of now   Pulmonary evaluated  patient as well  Family History  Problem Relation Age of Onset   Alcoholism Father    Diabetes Other    Stroke Other    Hypertension Other     Social History   Tobacco Use   Smoking status: Never   Smokeless tobacco: Never  Substance Use Topics   Alcohol use: Not Currently    Comment: Occ   Drug use: No    Allergies  Allergen Reactions   Banana     Other reaction(s): hives    Review of Systems: ROS All Other ROS was negative, except mentioned above   Past Medical History:  Diagnosis Date   AIDS (acquired immune deficiency syndrome) ([place]) 06/01/2021   dx'ed 2010. Doesn't recall cd4 nadir. Sexual transmission. 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 No fever, chill, nightsweat. Never been on bactrim   Syphilis 06/01/2021   Remember getting 1 IM shot 04/2021; 02/2021 rpr 128. Doesn't recall rash before that. No concerning sx now via ROS as of 06/01/2021     Thrush 06/01/2021   Has had what he thinks is thrush for 3 months prior to 05/2021 visit. Lots of problem with pain when swallowing and also white plaque on tongue and throat  Also has hypertrophic nodule along gum lines  Started getting medication with spit and squish x3 separate times no effect. Received them at emergency room       Scheduled Meds:  bictegravir-emtricitabine-tenofovir AF  1 tablet Oral Daily   enoxaparin (LOVENOX) injection   40 mg Subcutaneous Q24H   sodium chloride flush  3 mL Intravenous Q12H   sulfamethoxazole-trimethoprim  1 tablet Oral Once per day on Mon Wed Fri   Continuous Infusions:  sodium chloride     vancomycin Stopped (02/08/22 0752)   PRN Meds:.acetaminophen **OR** acetaminophen, HYDROmorphone (DILAUDID) injection, hydrOXYzine, naLOXone (NARCAN)  injection, ondansetron **OR** ondansetron (ZOFRAN) IV, oxyCODONE, senna-docusate, traZODone   OBJECTIVE: Blood pressure 136/63, pulse (!) 122, temperature 98.1 F (36.7 C), temperature source Oral, resp. rate (!) 23, height 6' (1.829 m), weight 74.4 kg, SpO2 92 %.  Physical Exam  General/constitutional: mild mod distress complaining of lue arm pain and pleuritic chest pain, conversant HEENT: Normocephalic, PER, Conj Clear, EOMI, Oropharynx clear Neck supple CV: rrr no mrg Lungs: clear to auscultation, splinting breathing Abd: Soft, Nontender Ext: no edema Skin: erythema over anterior chest wall; erythematous lue with tenderness/swelling  and a few medial side purulent tracts Neuro: nonfocal MSK: no peripheral joint swelling/tenderness/warmth; back spines nontender   Lab Results Lab Results  Component Value Date   WBC 7.6 02/08/2022   HGB 11.4 (L) 02/08/2022   HCT 33.9 (L) 02/08/2022   MCV 82.9 02/08/2022   PLT 104 (L) 02/08/2022    Lab Results  Component Value Date   CREATININE 1.06 02/08/2022   BUN 14 02/08/2022   NA 130 (L) 02/08/2022   K 3.7 02/08/2022   CL 102 02/08/2022   CO2 21 (L) 02/08/2022    Lab Results  Component Value Date   ALT 30 02/08/2022   AST 37 02/08/2022   ALKPHOS 59 02/08/2022   BILITOT 0.7 02/08/2022      Microbiology: Recent Results (from the past 240 hour(s))  Blood Culture (routine x 2)     Status: None (Preliminary result)   Collection Time: 02/07/22  5:01 PM   Specimen: BLOOD RIGHT HAND  Result Value Ref Range Status   Specimen Description   Final    BLOOD RIGHT HAND Performed at [facility], [address], [place], [place] [address]    Special Requests   Final    BOTTLES DRAWN AEROBIC AND ANAEROBIC Blood Culture results may not be optimal due to an inadequate volume of blood received in culture bottles Performed at [facility], [address] [place], [place] [address]    Culture  Setup Time   Final    GRAM POSITIVE COCCI BOTTLES DRAWN AEROBIC AND ANAEROBIC Gram Stain Report Called to,Read Back By and Verified With: GUILBEAULT,D '@0745'$  BY MATTHEWS, B 8.16.2023 IN BOTH AEROBIC AND ANAEROBIC BOTTLES    Culture GRAM POSITIVE COCCI  Final   Report Status PENDING  Incomplete  Blood Culture (routine x 2)     Status: None (Preliminary result)   Collection Time: 02/07/22  5:01 PM   Specimen: BLOOD RIGHT ARM  Result Value Ref Range Status   Specimen Description   Final    BLOOD RIGHT ARM Performed at [facility], [address] [place], [place] [address]    Special Requests   Final    BOTTLES DRAWN AEROBIC AND ANAEROBIC Blood Culture results may not be optimal due to an inadequate volume of blood received in culture bottles Performed at [facility], [address], [place], [place] [address]    Culture  Setup Time   Final    GRAM POSITIVE COCCI BOTTLES DRAWN AEROBIC AND ANAEROBIC Gram Stain Report Called to,Read Back By and Verified With: GUILBEAULT,D'@0745'$  BY MATTHEWS, B 8.16.2023 IN BOTH AEROBIC AND ANAEROBIC BOTTLES GRAM STAIN REVIEWED-AGREE WITH RESULT CRITICAL RESULT CALLED TO, READ BACK BY AND VERIFIED WITH: Noemi Chapel [id number] AT 1411 BY EC Performed at [facility], [place] [address] [place], [place] [address]    Culture GRAM POSITIVE COCCI  Final   Report Status PENDING  Incomplete  Blood Culture ID Panel (Reflexed)     Status: Abnormal   Collection Time: 02/07/22  5:01 PM  Result Value Ref Range Status   Enterococcus faecalis NOT DETECTED NOT DETECTED Final   Enterococcus Faecium NOT DETECTED NOT DETECTED Final   Listeria monocytogenes NOT  DETECTED NOT DETECTED Final   Staphylococcus species DETECTED (A) NOT DETECTED Final    Comment: CRITICAL RESULT CALLED TO, READ BACK BY AND VERIFIED WITH: PHARMD Alycia Rossetti [id number] AT 1411 BY EC    Staphylococcus aureus (BCID) DETECTED (A) NOT DETECTED Final    Comment: Methicillin (oxacillin)-resistant  Staphylococcus aureus (MRSA). MRSA is predictably resistant to beta-lactam antibiotics (except ceftaroline). Preferred therapy is vancomycin unless clinically contraindicated. Patient requires contact precautions if  hospitalized. CRITICAL RESULT CALLED TO, READ BACK BY AND VERIFIED WITH: Cameron [id number] AT 78 BY EC    Staphylococcus epidermidis NOT DETECTED NOT DETECTED Final   Staphylococcus lugdunensis NOT DETECTED NOT DETECTED Final   Streptococcus species NOT DETECTED NOT DETECTED Final   Streptococcus agalactiae NOT DETECTED NOT DETECTED Final   Streptococcus pneumoniae NOT DETECTED NOT DETECTED Final   Streptococcus pyogenes NOT DETECTED NOT DETECTED Final   A.calcoaceticus-baumannii NOT DETECTED NOT DETECTED Final   Bacteroides fragilis NOT DETECTED NOT DETECTED Final   Enterobacterales NOT DETECTED NOT DETECTED Final   Enterobacter cloacae complex NOT DETECTED NOT DETECTED Final   Escherichia coli NOT DETECTED NOT DETECTED Final   Klebsiella aerogenes NOT DETECTED NOT DETECTED Final   Klebsiella oxytoca NOT DETECTED NOT DETECTED Final   Klebsiella pneumoniae NOT DETECTED NOT DETECTED Final   Proteus species NOT DETECTED NOT DETECTED Final   Salmonella species NOT DETECTED NOT DETECTED Final   Serratia marcescens NOT DETECTED NOT DETECTED Final   Haemophilus influenzae NOT DETECTED NOT DETECTED Final   Neisseria meningitidis NOT DETECTED NOT DETECTED Final   Pseudomonas aeruginosa NOT DETECTED NOT DETECTED Final   Stenotrophomonas maltophilia NOT DETECTED NOT DETECTED Final   Candida albicans NOT DETECTED NOT DETECTED Final   Candida auris NOT  DETECTED NOT DETECTED Final   Candida glabrata NOT DETECTED NOT DETECTED Final   Candida krusei NOT DETECTED NOT DETECTED Final   Candida parapsilosis NOT DETECTED NOT DETECTED Final   Candida tropicalis NOT DETECTED NOT DETECTED Final   Cryptococcus neoformans/gattii NOT DETECTED NOT DETECTED Final   Meth resistant mecA/C and MREJ DETECTED (A) NOT DETECTED Final    Comment: CRITICAL RESULT CALLED TO, READ BACK BY AND VERIFIED WITH: Noemi Chapel [id number] AT 1411 BY EC Performed at [facility], [address], [place], [place] [address]   Resp Panel by RT-PCR (Flu A&B, Covid) Anterior Nasal Swab     Status: None   Collection Time: 02/07/22  5:20 PM   Specimen: Anterior Nasal Swab  Result Value Ref Range Status   SARS Coronavirus 2 by RT PCR NEGATIVE NEGATIVE Final    Comment: (NOTE) SARS-CoV-2 target nucleic acids are NOT DETECTED.  The SARS-CoV-2 RNA is generally detectable in upper respiratory specimens during the acute phase of infection. The lowest concentration of SARS-CoV-2 viral copies this assay can detect is 138 copies/mL. A negative result does not preclude SARS-Cov-2 infection and should not be used as the sole basis for treatment or other patient management decisions. A negative result may occur with  improper specimen collection/handling, submission of specimen other than nasopharyngeal swab, presence of viral mutation(s) within the areas targeted by this assay, and inadequate number of viral copies(<138 copies/mL). A negative result must be combined with clinical observations, patient history, and epidemiological information. The expected result is Negative.  Fact Sheet for Patients:  EntrepreneurPulse.com.au  Fact Sheet for Healthcare Providers:  IncredibleEmployment.be  This test is no t yet approved or cleared by the [place] FDA and  has been authorized for detection and/or diagnosis of SARS-CoV-2  by FDA under an Emergency Use Authorization (EUA). This EUA will remain  in effect (meaning this test can be used) for the duration of the COVID-19 declaration under Section 564(b)(1) of the Act, 21 U.S.C.section 360bbb-3(b)(1), unless the authorization is terminated  or  revoked sooner.       Influenza A by PCR NEGATIVE NEGATIVE Final   Influenza B by PCR NEGATIVE NEGATIVE Final    Comment: (NOTE) The Xpert Xpress SARS-CoV-2/FLU/RSV plus assay is intended as an aid in the diagnosis of influenza from Nasopharyngeal swab specimens and should not be used as a sole basis for treatment. Nasal washings and aspirates are unacceptable for Xpert Xpress SARS-CoV-2/FLU/RSV testing.  Fact Sheet for Patients: EntrepreneurPulse.com.au  Fact Sheet for Healthcare Providers: IncredibleEmployment.be  This test is not yet approved or cleared by the [place] FDA and has been authorized for detection and/or diagnosis of SARS-CoV-2 by FDA under an Emergency Use Authorization (EUA). This EUA will remain in effect (meaning this test can be used) for the duration of the COVID-19 declaration under Section 564(b)(1) of the Act, 21 U.S.C. section 360bbb-3(b)(1), unless the authorization is terminated or revoked.  Performed at [facility], [address], [place], [place] [address]      Serology:    Imaging: If present, new imagings (plain films, ct scans, and mri) have been personally visualized and interpreted; radiology reports have been reviewed. Decision making incorporated into the Impression / Recommendations.  8/15 chest ct with contrast 1. Innumerable pulmonary nodules scattered throughout both lungs, several of which are cavitated. Differential considerations include septic pulmonary emboli as well as atypical bacterial or fungal pneumonia given history of HIV. 2. Asymmetric nodular skin thickening and subcutaneous edema in the left axilla,  concerning for cellulitis. No discrete abscess. 3. Trace bilateral pleural effusions.  Jabier Mutton, [facility] [phone] pager    02/08/2022, 2:34 PM

## 2022-02-08 NOTE — ED Notes (Signed)
Pt resting with family at the bedside, NAD noted, even RR and unlabored, call bell within reach for assistance, side rails up x2 for safety, pt voiced no concerns or questions at this time, care on going, will continue to monitor.

## 2022-02-08 NOTE — ED Notes (Addendum)
Pt returned from Vascular. 

## 2022-02-08 NOTE — ED Notes (Signed)
Secure message sent to receiving RN MATTHEW BASHORE, awaiting response. Pt has an inpatient bed at this time

## 2022-02-08 NOTE — Progress Notes (Signed)
PROGRESS NOTE    Rick Lam  CBJ:[id number] DOB: 09-18-1990 DOA: 02/07/2022 PCP: Jabier Mutton, MD     Brief Narrative:   HIV/AIDS, recent CD4 46 ,chronic hepatitis C, latent syphilis that has been treated, prior KS,presents with painful left axillary nodules with surrounding erythema, admitted for sepsis  CT chest with contrast showed "innumerable pulmonary nodules scattered throughout both lungs, several of which are cavitated. Differential considerations include septic pulmonary emboli as well as atypical bacterial or fungal pneumonia given history of HIV. 2. Asymmetric nodular skin thickening and subcutaneous edema in the left axilla, concerning for cellulitis. No discrete abscess. 3. Trace bilateral pleural effusions."   Subjective:  Tmax 100, persistent tachycardia appear less severe compared to initially, blood pressure stable, he is on room air, O2 sat above 95% Lactic acid normalized, creatinine normalized, WBC normalized ,sodium improved  Assessment & Plan:  Principal Problem:   Severe sepsis ([facility]) Active Problems:   AIDS (acquired immune deficiency syndrome) ([facility])   Hyponatremia   Acute respiratory failure with hypoxia ([facility])    Assessment and Plan:   Sepsis with bacteremia ,bilateral pulmonary nodules, left arm cellulitis, present on admission -Patient is immunosuppressed in the setting of HIV AIDS, recent CD4 78 -Blood culture positive for gram-positive cocci, echo pending, getting MRI left upper extremity ,currently on vancomycin and cefepime --Pulmonology/critical care and infectious disease consulted, input appreciated    HIV/ AIDS Management per ID  Hyponatremia Slightly improved Repeat BMP in the morning  AKI Creatinine normalized        I have Reviewed nursing notes, Vitals, pain scores, I/o's, Lab results and  imaging results since pt's last encounter, details please see discussion above  I ordered the following labs:  Unresulted  Labs (From admission, onward)     Start     Ordered   02/14/22 0500  Creatinine, serum  (enoxaparin (LOVENOX)    CrCl >/= 30 ml/min)  Weekly,   R     Comments: while on enoxaparin therapy    02/07/22 2353   02/08/22 0500  Comprehensive metabolic panel  Daily at 5am,   R      02/07/22 2353   02/08/22 0500  CBC with Differential/Platelet  Daily at 5am,   R      02/07/22 2353   02/08/22 0500  Acid Fast Smear (AFB)  (AFB smear + Culture w reflexed sensitivities panel)  Daily at 5am,   R     Question:  Patient immune status  Answer:  Immunocompromised  See Hyperspace for full Linked Orders Report.   02/07/22 2356   02/08/22 0500  Acid Fast Culture with reflexed sensitivities  (AFB smear + Culture w reflexed sensitivities panel)  Daily at 5am,   R     Question:  Patient immune status  Answer:  Immunocompromised  See Hyperspace for full Linked Orders Report.   02/07/22 2356   02/08/22 0430  QuantiFERON-TB Gold Plus  Once,   R        02/08/22 0430   02/07/22 2356  Expectorated Sputum Assessment w Gram Stain, Rflx to Resp Cult  Once,   R       Question:  Patient immune status  Answer:  Immunocompromised   02/07/22 2356   02/07/22 1634  Urine Culture  (Septic presentation on arrival (screening labs, nursing and treatment orders for obvious sepsis))  ONCE - URGENT,   URGENT       Question:  Indication  Answer:  Sepsis  02/07/22 1634             DVT prophylaxis: enoxaparin (LOVENOX) injection 40 mg Start: 02/08/22 0800   Code Status:   Code Status: Full Code  Family Communication: family at bedside  Disposition:    Dispo: The patient is from: Home              Anticipated d/c is to: TBD              Anticipated d/c date is: Not medically stable to discharge  Antimicrobials:    Anti-infectives (From admission, onward)    Start     Dose/Rate Route Frequency Ordered Stop   02/08/22 1000  bictegravir-emtricitabine-tenofovir AF (BIKTARVY) 50-200-25 MG per tablet 1 tablet         1 tablet Oral Daily 02/07/22 2353     02/08/22 1000  sulfamethoxazole-trimethoprim (BACTRIM DS) 800-160 MG per tablet 1 tablet  Status:  Discontinued        1 tablet Oral 2 times daily 02/07/22 2353 02/08/22 0000   02/08/22 0900  sulfamethoxazole-trimethoprim (BACTRIM DS) 800-160 MG per tablet 1 tablet        1 tablet Oral Once per day on Mon Wed Fri 02/08/22 0000     02/08/22 0700  vancomycin (VANCOCIN) IVPB 1000 mg/200 mL premix        1,000 mg 200 mL/hr over 60 Minutes Intravenous Every 12 hours 02/07/22 1832 02/15/22 0659   02/08/22 0300  ceFEPIme (MAXIPIME) 2 g in sodium chloride 0.9 % 100 mL IVPB  Status:  Discontinued        2 g 200 mL/hr over 30 Minutes Intravenous Every 8 hours 02/07/22 1832 02/08/22 1413   02/07/22 1915  fluconazole (DIFLUCAN) tablet 200 mg        200 mg Oral  Once 02/07/22 1906 02/07/22 1918   02/07/22 1730  vancomycin (VANCOREADY) IVPB 1500 mg/300 mL        1,500 mg 150 mL/hr over 120 Minutes Intravenous  Once 02/07/22 1638 02/07/22 2155   02/07/22 1645  ceFEPIme (MAXIPIME) 2 g in sodium chloride 0.9 % 100 mL IVPB        2 g 200 mL/hr over 30 Minutes Intravenous  Once 02/07/22 1634 02/07/22 1738   02/07/22 1645  metroNIDAZOLE (FLAGYL) IVPB 500 mg        500 mg 100 mL/hr over 60 Minutes Intravenous  Once 02/07/22 1634 02/07/22 1849   02/07/22 1645  vancomycin (VANCOCIN) IVPB 1000 mg/200 mL premix  Status:  Discontinued        1,000 mg 200 mL/hr over 60 Minutes Intravenous  Once 02/07/22 1634 02/07/22 1638   02/07/22 1645  vancomycin (VANCOCIN) IVPB 1000 mg/200 mL premix  Status:  Discontinued        1,000 mg 200 mL/hr over 60 Minutes Intravenous  Once 02/07/22 1643 02/07/22 1740   02/07/22 1645  piperacillin-tazobactam (ZOSYN) IVPB 3.375 g  Status:  Discontinued        3.375 g 100 mL/hr over 30 Minutes Intravenous  Once 02/07/22 1643 02/07/22 1740          Objective: Vitals:   02/08/22 0600 02/08/22 0615 02/08/22 0645 02/08/22 0700  BP: 98/73  114/64 114/73 117/69  Pulse: (!) 110 (!) 117 (!) 117 (!) 115  Resp: (!) '22 20 17 19  '$ Temp:      TempSrc:      SpO2: 100% 100% 95% 94%  Weight:      Height:  Intake/Output Summary (Last 24 hours) at 02/08/2022 0717 Last data filed at 02/08/2022 0325 Gross per 24 hour  Intake 7629.62 ml  Output --  Net 7629.62 ml   Filed Weights   02/07/22 1610  Weight: 74.4 kg    Examination:  General exam: alert, awake, communicative,calm, NAD Respiratory system: tachypnea appear has improved, Respiratory effort normal. Cardiovascular system:  sinus tachycardia Gastrointestinal system: Abdomen is nondistended, soft and nontender.  Normal bowel sounds heard. Central nervous system: Alert and oriented. No focal neurological deficits. Extremities:  left upper arm erythema Skin: left upper arm erythema Psychiatry: Judgement and insight appear normal. Mood & affect appropriate.     Data Reviewed: I have personally reviewed  labs and visualized  imaging studies since the last encounter and formulate the plan        Scheduled Meds:  bictegravir-emtricitabine-tenofovir AF  1 tablet Oral Daily   enoxaparin (LOVENOX) injection  40 mg Subcutaneous Q24H   sodium chloride flush  3 mL Intravenous Q12H   sulfamethoxazole-trimethoprim  1 tablet Oral Once per day on Mon Wed Fri   Continuous Infusions:  sodium chloride     ceFEPime (MAXIPIME) IV Stopped (02/08/22 0307)   vancomycin 1,000 mg (02/08/22 8882)     LOS: 1 day     Florencia Reasons, MD PhD FACP [facility]  Available via Epic secure chat 7am-7pm for nonurgent issues Please page for urgent issues To page the attending provider between 7A-7P or the covering provider during after hours 7P-7A, please log into the web site [email or web address] and access using universal Central password for that web site. If you do not have the password, please call the hospital operator.    02/08/2022, 7:17 AM

## 2022-02-08 NOTE — ED Notes (Signed)
Pt return from MRI at this time, NAD noted, A&O x4

## 2022-02-08 NOTE — ED Notes (Signed)
Pt transported to MRI 

## 2022-02-08 NOTE — Progress Notes (Addendum)
NAME:  ASHYR HEDGEPATH, MRN:  [id number], DOB:  1991/03/05, LOS: 1 ADMISSION DATE:  02/07/2022, CONSULTATION DATE:  02/07/22 REFERRING MD:  Roderic Palau, CHIEF COMPLAINT:  fever, chest discomfort, weakness   History of Present Illness:  31yM with history of HIV/AIDS with prior KS, syphilis, chronic HCV seen in [facility] in [place]/Dr. Johnny Bridge. He reported several days of worsening redness, pain, warmth in L axilla with a number of 'squishy' lumps to PCP at virtual visit 8/14 and was prescribed a course of bactrim. Pain however became severe enough that he presented to [facility] today where he was found to have sepsis with likely pulmonary, cutaneous, and possible bloodstream source. There was some concern for nec fasc of left arm however surgery reviewed his imaging and felt this was unlikely. He was given 3L crystalloid, vanc, cefepime, flagyl.  At last clinic visit with Dr. Johnny Bridge had been doing well, reportedly adherent to biktarvy and had had improvement in KS-like rash, candida esophagitis and had gained weight. 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 Increase in viral load at that visit, CD4 count 78.  Pertinent  Medical History  HIV/AIDS KS Late latent syphilis, treated Chronic HCV  Significant Hospital Events: Including procedures, antibiotic start and stop dates in addition to other pertinent events   02/06/22 transfer from [facility] to ED, started on vanc/cefepime  Interim History / Subjective:  Reports pain  Objective   Blood pressure 127/75, pulse (!) 116, temperature 98.1 F (36.7 C), temperature source Oral, resp. rate (!) 29, height 6' (1.829 m), weight 74.4 kg, SpO2 95 %.        Intake/Output Summary (Last 24 hours) at 02/08/2022 0912 Last data filed at 02/08/2022 5681 Gross per 24 hour  Intake 7830.11 ml  Output --  Net 7830.11 ml   Filed Weights   02/07/22 1610  Weight: 74.4 kg    Examination: General: Thin male in no acute distress HEENT:  MM pink/moist no JVD or lymphadenopathy is appreciated Neuro: Grossly intact without focal defect CV: Heart sounds are regular PULM: Diminished in the bases   GI: soft, bsx4 active  GU: Voids Extremities: warm/dry, left axilla with erythema Skin: no rashes or lesions    Resolved Hospital Problem list    Assessment & Plan:   # Sepsis with pulmonary, SSTI source Atypical infection, BSI with septic embolization/metastatic focus of infection at left arm with either typical bacteria or MTB/NTM. Had a negative quantiferon 05/2021 but in setting of cd4 count ~50. He also had scattered nodular opacities on CT 05/2021.  Weanable precautions and AFB sputum cultures Monitoring Gold TTE Vancomycin and cefepime with ID consult May need fiberoptic bronchoscopy for further diagnostic input Pulmonary will continue to follow     # Hyponatremia Mild Recent Labs  Lab 02/07/22 1701 02/08/22 0410  NA 128* 130*    Monitor  # Lactic acidosis Trend lactic acidosis  # AKI Lab Results  Component Value Date   CREATININE 1.06 02/08/2022   CREATININE 1.38 (H) 02/07/2022   CREATININE 1.17 01/13/2022   CREATININE 0.96 09/07/2021   CREATININE 0.72 07/06/2021    Avoid nephrotoxins  # HIV/AIDS # Positive RPR Continue biktarvy ID consult   Best Practice (right click and "Reselect all SmartList Selections" daily)   Per primary  Labs   CBC: Recent Labs  Lab 02/07/22 1701 02/08/22 0410  WBC 12.2* 7.6  NEUTROABS 11.2* 6.5  HGB 14.2 11.4*  HCT 43.7 33.9*  MCV 84.5  82.9  PLT 156 104*    Basic Metabolic Panel: Recent Labs  Lab 02/07/22 1701 02/08/22 0410  NA 128* 130*  K 4.0 3.7  CL 93* 102  CO2 21* 21*  GLUCOSE 124* 92  BUN 22* 14  CREATININE 1.38* 1.06  CALCIUM 8.1* 7.2*   GFR: Estimated Creatinine Clearance: 104.3 mL/min (by C-G formula based on SCr of 1.06 mg/dL). Recent Labs  Lab 02/07/22 1701 02/07/22 1901 02/08/22 0410  WBC 12.2*  --  7.6   LATICACIDVEN 4.1* 1.3  --     Liver Function Tests: Recent Labs  Lab 02/07/22 1701 02/08/22 0410  AST 55* 37  ALT 44 30  ALKPHOS 82 59  BILITOT 0.9 0.7  PROT 8.0 5.4*  ALBUMIN 3.3* 2.1*   No results for input(s): "LIPASE", "AMYLASE" in the last 168 hours. No results for input(s): "AMMONIA" in the last 168 hours.  ABG No results found for: "PHART", "PCO2ART", "PO2ART", "HCO3", "TCO2", "ACIDBASEDEF", "O2SAT"   Coagulation Profile: Recent Labs  Lab 02/07/22 1821  INR 1.3*    Cardiac Enzymes: No results for input(s): "CKTOTAL", "CKMB", "CKMBINDEX", "TROPONINI" in the last 168 hours.  HbA1C: No results found for: "HGBA1C"  CBG: No results for input(s): "GLUCAP" in the last 168 hours.    Richardson Landry Minor ACNP Acute Care Nurse Practitioner [place] Please consult Amion 02/08/2022, 9:12 AM  Attending Attestation  I agree with the Advanced Practitioner's note, impression, and recommendations as outlined. I have taken an independent interval history, reviewed the chart and examined the patient.  My medical decision making is as follows:   Subjective: 31 yo man with history of HIV prior AIDS defining illness with KS in 2022 (cutaneous manifestations,) chronic HCV, most recently CD4 count 78.  Has been doing well over the past 6 months ( mostly adherent to ARVs, although has missed doses while on vacation for a week or two.) Has been gaining weight after severe weight loss last year. No drug or etoh use in the last year. Here with 4-5 days or myalgias, fatigue, left axillary pain and swelling and erythema.   Objective: Blood pressure 124/71, pulse (!) 124, temperature 98.1 F (36.7 C), temperature source Oral, resp. rate 19, height 6' (1.829 m), weight 74.4 kg, SpO2 92 %.  Resting comfortably on nasal cannul Shallow inspiration Tachycardic, regular LUE erythema and swelling with axillary ruptured pustules. I did demarcate the skin to monitor for  progression.  No peripheral edema.  No janeway lesions, no purpura No thrush, no oral or peripheral KS noted.  Poor dentition  Labs/Imaging: CT Chest reviewed with multiple nodular cavitating opacities in a random distribution consistent with septic embolic phenomenon.  Blood cultures GPC+ 2/2   Assessment and Plan:  Acute hypoxemic respiratory insufficiency Numerous cavitating pulmonary nodules AIDS GPC bacteremia with LUE cellulitis and high concern for acute bacterial endocarditis  Highest suspicion that these are septic emboli from bacterial endocarditis. Echo scheduled for 1pm today and should not be delayed.  Also consider atypical infection, endobronchial KS.  Would consider bronchoscopy for airway examination for KS and BAL if no improvement with preliminary abx.  Discussed plan of care with sister, patient and mom.  Tb Quantiferon ordered and pending. Low suspicion for primary pulmonary tuberculosis.   PCCM will follow.   Spero Geralds [facility] 02/08/2022 10:36 AM  Pager: see AMION  If no response to pager, please call critical care on call (see AMION) until 7pm After 7:00  pm call Elink

## 2022-02-08 NOTE — ED Notes (Signed)
Pt had one episode of vomiting

## 2022-02-08 NOTE — ED Notes (Addendum)
Receiving RN Rick Lam has agreed to accept TOC once pt has arrived to inpatient unit, all questions and concerns address. Pt has been switch to "[employer]

## 2022-02-08 NOTE — Progress Notes (Signed)
Bilateral LE venous duplex study completed. Please see CV Proc for preliminary results.  Barbarann Kelly BS, RVT 02/08/2022 11:31 AM

## 2022-02-08 NOTE — Progress Notes (Signed)
PHARMACY - PHYSICIAN COMMUNICATION CRITICAL VALUE ALERT - BLOOD CULTURE IDENTIFICATION (BCID)  RENDER Rick Lam is an 31 y.o. male who presented to [facility] on 02/07/2022 with a chief complaint of L-arm pain/swelling, SOB/CP  Assessment: 29 YOM with concern for LLE cellulitis and PNA now with 4/4 bottles growing GPC with BCID detecting staph aureus, with mecA/MPJE detected  Name of physician (or Provider) Contacted: Vu (ID consult)  Current antibiotics: Vancomycin + Cefepime  Changes to prescribed antibiotics recommended:  D/c Cefepime, continue Vancomycin monotherapy for now  Results for orders placed or performed during the hospital encounter of 02/07/22  Blood Culture ID Panel (Reflexed) (Collected: 02/07/2022  5:01 PM)  Result Value Ref Range   Enterococcus faecalis NOT DETECTED NOT DETECTED   Enterococcus Faecium NOT DETECTED NOT DETECTED   Listeria monocytogenes NOT DETECTED NOT DETECTED   Staphylococcus species DETECTED (A) NOT DETECTED   Staphylococcus aureus (BCID) DETECTED (A) NOT DETECTED   Staphylococcus epidermidis NOT DETECTED NOT DETECTED   Staphylococcus lugdunensis NOT DETECTED NOT DETECTED   Streptococcus species NOT DETECTED NOT DETECTED   Streptococcus agalactiae NOT DETECTED NOT DETECTED   Streptococcus pneumoniae NOT DETECTED NOT DETECTED   Streptococcus pyogenes NOT DETECTED NOT DETECTED   A.calcoaceticus-baumannii NOT DETECTED NOT DETECTED   Bacteroides fragilis NOT DETECTED NOT DETECTED   Enterobacterales NOT DETECTED NOT DETECTED   Enterobacter cloacae complex NOT DETECTED NOT DETECTED   Escherichia coli NOT DETECTED NOT DETECTED   Klebsiella aerogenes NOT DETECTED NOT DETECTED   Klebsiella oxytoca NOT DETECTED NOT DETECTED   Klebsiella pneumoniae NOT DETECTED NOT DETECTED   Proteus species NOT DETECTED NOT DETECTED   Salmonella species NOT DETECTED NOT DETECTED   Serratia marcescens NOT DETECTED NOT DETECTED   Haemophilus influenzae NOT DETECTED NOT  DETECTED   Neisseria meningitidis NOT DETECTED NOT DETECTED   Pseudomonas aeruginosa NOT DETECTED NOT DETECTED   Stenotrophomonas maltophilia NOT DETECTED NOT DETECTED   Candida albicans NOT DETECTED NOT DETECTED   Candida auris NOT DETECTED NOT DETECTED   Candida glabrata NOT DETECTED NOT DETECTED   Candida krusei NOT DETECTED NOT DETECTED   Candida parapsilosis NOT DETECTED NOT DETECTED   Candida tropicalis NOT DETECTED NOT DETECTED   Cryptococcus neoformans/gattii NOT DETECTED NOT DETECTED   Meth resistant mecA/C and MREJ DETECTED (A) NOT DETECTED    Thank you for allowing pharmacy to be a part of this patient's care.  Alycia Rossetti, PharmD, BCPS Infectious Diseases Clinical Pharmacist 02/08/2022 2:17 PM   **Pharmacist phone directory can now be found on [email or web address] ([email or web address]).  Listed under [place].

## 2022-02-09 DIAGNOSIS — B20 Human immunodeficiency virus [HIV] disease: Secondary | ICD-10-CM

## 2022-02-09 DIAGNOSIS — B9562 Methicillin resistant Staphylococcus aureus infection as the cause of diseases classified elsewhere: Secondary | ICD-10-CM

## 2022-02-09 DIAGNOSIS — R7881 Bacteremia: Secondary | ICD-10-CM

## 2022-02-09 DIAGNOSIS — I76 Septic arterial embolism: Secondary | ICD-10-CM

## 2022-02-09 DIAGNOSIS — I2690 Septic pulmonary embolism without acute cor pulmonale: Secondary | ICD-10-CM

## 2022-02-09 DIAGNOSIS — R6520 Severe sepsis without septic shock: Secondary | ICD-10-CM

## 2022-02-09 DIAGNOSIS — J984 Other disorders of lung: Secondary | ICD-10-CM

## 2022-02-09 DIAGNOSIS — A419 Sepsis, unspecified organism: Secondary | ICD-10-CM

## 2022-02-09 DIAGNOSIS — L02414 Cutaneous abscess of left upper limb: Secondary | ICD-10-CM

## 2022-02-09 LAB — CBC WITH DIFFERENTIAL/PLATELET
Abs Immature Granulocytes: 0.05 10*3/uL (ref 0.00–0.07)
Basophils Absolute: 0 10*3/uL (ref 0.0–0.1)
Basophils Relative: 0 %
Eosinophils Absolute: 0 10*3/uL (ref 0.0–0.5)
Eosinophils Relative: 1 %
HCT: 32.3 % — ABNORMAL LOW (ref 39.0–52.0)
Hemoglobin: 11 g/dL — ABNORMAL LOW (ref 13.0–17.0)
Immature Granulocytes: 1 %
Lymphocytes Relative: 13 %
Lymphs Abs: 1.1 10*3/uL (ref 0.7–4.0)
MCH: 27.4 pg (ref 26.0–34.0)
MCHC: 34.1 g/dL (ref 30.0–36.0)
MCV: 80.5 fL (ref 80.0–100.0)
Monocytes Absolute: 0.6 10*3/uL (ref 0.1–1.0)
Monocytes Relative: 7 %
Neutro Abs: 6.8 10*3/uL (ref 1.7–7.7)
Neutrophils Relative %: 78 %
Platelets: 134 10*3/uL — ABNORMAL LOW (ref 150–400)
RBC: 4.01 MIL/uL — ABNORMAL LOW (ref 4.22–5.81)
RDW: 16.1 % — ABNORMAL HIGH (ref 11.5–15.5)
WBC: 8.6 10*3/uL (ref 4.0–10.5)
nRBC: 0 % (ref 0.0–0.2)

## 2022-02-09 LAB — COMPREHENSIVE METABOLIC PANEL
ALT: 27 U/L (ref 0–44)
AST: 28 U/L (ref 15–41)
Albumin: 2.1 g/dL — ABNORMAL LOW (ref 3.5–5.0)
Alkaline Phosphatase: 63 U/L (ref 38–126)
Anion gap: 7 (ref 5–15)
BUN: 9 mg/dL (ref 6–20)
CO2: 24 mmol/L (ref 22–32)
Calcium: 7.7 mg/dL — ABNORMAL LOW (ref 8.9–10.3)
Chloride: 97 mmol/L — ABNORMAL LOW (ref 98–111)
Creatinine, Ser: 0.8 mg/dL (ref 0.61–1.24)
GFR, Estimated: >60 mL/min (ref >60)
Glucose, Bld: 102 mg/dL — ABNORMAL HIGH (ref 70–99)
Potassium: 3.7 mmol/L (ref 3.5–5.1)
Sodium: 128 mmol/L — ABNORMAL LOW (ref 135–145)
Total Bilirubin: 0.8 mg/dL (ref 0.3–1.2)
Total Protein: 5.6 g/dL — ABNORMAL LOW (ref 6.5–8.1)

## 2022-02-09 LAB — SODIUM, URINE, RANDOM: Sodium, Ur: 27 mmol/L

## 2022-02-09 LAB — OSMOLALITY, URINE: Osmolality, Ur: 697 mOsm/kg (ref 300–900)

## 2022-02-09 MED ORDER — SODIUM CHLORIDE 0.9 % IV SOLN: INTRAVENOUS | Status: AC

## 2022-02-09 MED ORDER — VANCOMYCIN HCL IN DEXTROSE 1-5 GM/200ML-% IV SOLN
1000.0000 mg | Freq: Three times a day (TID) | INTRAVENOUS | Status: DC
  Administered 2022-02-09 – 2022-02-10 (×3): 1000 mg via INTRAVENOUS
  Filled 2022-02-09 (×5): qty 200

## 2022-02-09 MED ORDER — SODIUM CHLORIDE 0.9 % IV SOLN: INTRAVENOUS | Status: DC

## 2022-02-09 NOTE — Progress Notes (Signed)
PROGRESS NOTE    Rick Lam  IOE:[id number] DOB: 10/25/1990 DOA: 02/07/2022 PCP: Jabier Mutton, MD     Brief Narrative:   HIV/AIDS, recent CD4 82 ,chronic hepatitis C, latent syphilis that has been treated, prior KS,presents with painful left axillary nodules with surrounding erythema, admitted for sepsis  CT chest with contrast showed "innumerable pulmonary nodules scattered throughout both lungs, several of which are cavitated. Differential considerations include septic pulmonary emboli as well as atypical bacterial or fungal pneumonia given history of HIV. 2. Asymmetric nodular skin thickening and subcutaneous edema in the left axilla, concerning for cellulitis. No discrete abscess. 3. Trace bilateral pleural effusions."   Subjective:  No  fever overnight He continue to c/o chest pain, left arm pain Mother at bedside  Tmax 100, persistent tachycardia appear less severe compared to initially, blood pressure stable, he is on room air, O2 sat above 95% Lactic acid normalized, creatinine normalized, WBC normalized ,sodium improved  Assessment & Plan:  Principal Problem:   Severe sepsis (HCC) Active Problems:   AIDS (acquired immune deficiency syndrome) (HCC)   Hyponatremia   Acute respiratory failure with hypoxia (HCC)   Acute septic pulmonary embolism without acute cor pulmonale (HCC)   Cutaneous abscess of left upper extremity    Assessment and Plan:   Sepsis with bacteremia ,bilateral pulmonary nodules, left arm cellulitis, present on admission -Patient is immunosuppressed in the setting of HIV AIDS, recent CD4 84 -Blood culture positive for gram-positive cocci -  MRI left upper extremity with small  abscesses x2, ortho consulted  -cards consulted for TEE - currently on vancomycin and cefepime --Pulmonology/critical care and infectious disease consulted, input appreciated    HIV/ AIDS Management per ID  Hyponatremia Persistently low, will get urine  sodium/urine osmo/ serum osmo Plan to start hydration after urine sample obtained Repeat BMP in the morning  AKI Creatinine normalized        I have Reviewed nursing notes, Vitals, pain scores, I/o's, Lab results and  imaging results since pt's last encounter, details please see discussion above  I ordered the following labs:  Unresulted Labs (From admission, onward)     Start     Ordered   02/14/22 0500  Creatinine, serum  (enoxaparin (LOVENOX)    CrCl >/= 30 ml/min)  Weekly,   R     Comments: while on enoxaparin therapy    02/07/22 2353   02/10/22 1330  Vancomycin, trough  Once-Timed,   TIMED       Comments: *NOTE* A vancomycin trough is ordered for 1330 on 8/18.  Please do not give the 1400 dose until after the trough is drawn. May give dose after trough drawn unless otherwise instructed by pharmacist or physician. Thanks, pharmacy    02/09/22 0944   02/10/22 0800  Vancomycin, peak  Once-Timed,   TIMED       Comments: *NOTE* Administer the vancomycin dose at 0600 on 8/18. A vancomycin peak is ordered for 0800 on 8/18.  If the administration of vancomycin is delayed greater than 38mn, please contact pharmacy to reschedule the peak. Thank you, pharmacy    02/09/22 0944   02/09/22 0500  Culture, blood (Routine X 2) w Reflex to ID Panel  BLOOD CULTURE X 2,   R (with TIMED occurrences)      02/08/22 1431   02/09/22 0500  HCV RNA quant  Tomorrow morning,   R        02/08/22 1441   02/09/22 0500  QuantiFERON-TB Gold Plus  Once,   R        02/09/22 0500   02/08/22 1005  HIV-1 RNA quant-no reflex-bld  Once,   R        02/08/22 1004   02/08/22 0500  Comprehensive metabolic panel  Daily at 5am,   R      02/07/22 2353   02/08/22 0500  CBC with Differential/Platelet  Daily at 5am,   R      02/07/22 2353             DVT prophylaxis: enoxaparin (LOVENOX) injection 40 mg Start: 02/08/22 0800   Code Status:   Code Status: Full Code  Family Communication: mother at bedside   Disposition:    Dispo: The patient is from: Home              Anticipated d/c is to: TBD              Anticipated d/c date is: Not medically stable to discharge  Antimicrobials:    Anti-infectives (From admission, onward)    Start     Dose/Rate Route Frequency Ordered Stop   02/09/22 1400  vancomycin (VANCOCIN) IVPB 1000 mg/200 mL premix        1,000 mg 200 mL/hr over 60 Minutes Intravenous Every 8 hours 02/09/22 0944     02/08/22 1000  bictegravir-emtricitabine-tenofovir AF (BIKTARVY) 50-200-25 MG per tablet 1 tablet        1 tablet Oral Daily 02/07/22 2353     02/08/22 1000  sulfamethoxazole-trimethoprim (BACTRIM DS) 800-160 MG per tablet 1 tablet  Status:  Discontinued        1 tablet Oral 2 times daily 02/07/22 2353 02/08/22 0000   02/08/22 0900  sulfamethoxazole-trimethoprim (BACTRIM DS) 800-160 MG per tablet 1 tablet        1 tablet Oral Once per day on Mon Wed Fri 02/08/22 0000     02/08/22 0700  vancomycin (VANCOCIN) IVPB 1000 mg/200 mL premix  Status:  Discontinued        1,000 mg 200 mL/hr over 60 Minutes Intravenous Every 12 hours 02/07/22 1832 02/09/22 0944   02/08/22 0300  ceFEPIme (MAXIPIME) 2 g in sodium chloride 0.9 % 100 mL IVPB  Status:  Discontinued        2 g 200 mL/hr over 30 Minutes Intravenous Every 8 hours 02/07/22 1832 02/08/22 1413   02/07/22 1915  fluconazole (DIFLUCAN) tablet 200 mg        200 mg Oral  Once 02/07/22 1906 02/07/22 1918   02/07/22 1730  vancomycin (VANCOREADY) IVPB 1500 mg/300 mL        1,500 mg 150 mL/hr over 120 Minutes Intravenous  Once 02/07/22 1638 02/07/22 2155   02/07/22 1645  ceFEPIme (MAXIPIME) 2 g in sodium chloride 0.9 % 100 mL IVPB        2 g 200 mL/hr over 30 Minutes Intravenous  Once 02/07/22 1634 02/07/22 1738   02/07/22 1645  metroNIDAZOLE (FLAGYL) IVPB 500 mg        500 mg 100 mL/hr over 60 Minutes Intravenous  Once 02/07/22 1634 02/07/22 1849   02/07/22 1645  vancomycin (VANCOCIN) IVPB 1000 mg/200 mL premix  Status:   Discontinued        1,000 mg 200 mL/hr over 60 Minutes Intravenous  Once 02/07/22 1634 02/07/22 1638   02/07/22 1645  vancomycin (VANCOCIN) IVPB 1000 mg/200 mL premix  Status:  Discontinued        1,000 mg  200 mL/hr over 60 Minutes Intravenous  Once 02/07/22 1643 02/07/22 1740   02/07/22 1645  piperacillin-tazobactam (ZOSYN) IVPB 3.375 g  Status:  Discontinued        3.375 g 100 mL/hr over 30 Minutes Intravenous  Once 02/07/22 1643 02/07/22 1740          Objective: Vitals:   02/09/22 0900 02/09/22 1136 02/09/22 1539 02/09/22 1618  BP: 125/67 125/68 126/67 116/71  Pulse: (!) 109 (!) 108 (!) 116 (!) 114  Resp: '14 18 14 11  '$ Temp: 98 F (36.7 C) 98 F (36.7 C) 97.9 F (36.6 C)   TempSrc: Oral Oral Oral   SpO2: 93% 96% 90% 95%  Weight:      Height:        Intake/Output Summary (Last 24 hours) at 02/09/2022 1744 Last data filed at 02/09/2022 1645 Gross per 24 hour  Intake 620 ml  Output 220 ml  Net 400 ml   Filed Weights   02/07/22 1610 02/09/22 0000  Weight: 74.4 kg 79.5 kg    Examination:  General exam: alert, awake,  Respiratory system: tachypnea appear has improved, Respiratory effort normal. Cardiovascular system:  persistent sinus tachycardia Gastrointestinal system: Abdomen is nondistended, soft and nontender.  Normal bowel sounds heard. Central nervous system: Alert and oriented. No focal neurological deficits. Extremities:  left upper arm erythema appear slightly improved  Skin: left upper arm erythema Psychiatry: Anxious.     Data Reviewed: I have personally reviewed  labs and visualized  imaging studies since the last encounter and formulate the plan        Scheduled Meds:  bictegravir-emtricitabine-tenofovir AF  1 tablet Oral Daily   enoxaparin (LOVENOX) injection  40 mg Subcutaneous Q24H   sodium chloride flush  3 mL Intravenous Q12H   sulfamethoxazole-trimethoprim  1 tablet Oral Once per day on Mon Wed Fri   Continuous Infusions:   vancomycin Stopped (02/09/22 1505)     LOS: 2 days     Florencia Reasons, MD PhD FACP [facility]  Available via Epic secure chat 7am-7pm for nonurgent issues Please page for urgent issues To page the attending provider between 7A-7P or the covering provider during after hours 7P-7A, please log into the web site [email or web address] and access using universal [place] password for that web site. If you do not have the password, please call the hospital operator.    02/09/2022, 5:44 PM

## 2022-02-09 NOTE — Consult Note (Signed)
Reason for Consult:LUE cellulitis Referring Physician: Florencia Reasons Time called: 0263 Time at bedside: Melville is an 31 y.o. male.  HPI: Reyn was admitted with LUE cellulitis yesterday. He says he noticed some redness in his axilla that was associated with some mild pain Sunday and then woke up Monday with severe pain and swelling of the arm. He denies any prior hx/o similar or problems with hidradenitis. He is RHD and unemployed.  Past Medical History:  Diagnosis Date   AIDS (acquired immune deficiency syndrome) ([facility]) 06/01/2021   dx'ed 2010. Doesn't recall cd4 nadir. Sexual transmission. 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 No fever, chill, nightsweat. Never been on bactrim   Syphilis 06/01/2021   Remember getting 1 IM shot 04/2021; 02/2021 rpr 128. Doesn't recall rash before that. No concerning sx now via ROS as of 06/01/2021     Thrush 06/01/2021   Has had what he thinks is thrush for 3 months prior to 05/2021 visit. Lots of problem with pain when swallowing and also white plaque on tongue and throat  Also has hypertrophic nodule along gum lines  Started getting medication with spit and squish x3 separate times no effect. Received them at emergency room    History reviewed. No pertinent surgical history.  Family History  Problem Relation Age of Onset   Alcoholism Father    Diabetes Other     Stroke Other    Hypertension Other     Social History:  reports that he has never smoked. He has never used smokeless tobacco. He reports that he does not currently use alcohol. He reports that he does not use drugs.  Allergies:  Allergies  Allergen Reactions   Banana     Other reaction(s): hives    Medications: I have reviewed the patient's current medications.  Results for orders placed or performed during the hospital encounter of 02/07/22 (from the past 48 hour(s))  Lactic acid, plasma     Status: Abnormal   Collection Time: 02/07/22  5:01 PM  Result Value Ref Range   Lactic Acid, Venous 4.1 (HH) 0.5 - 1.9 mmol/L    Comment: CRITICAL RESULT CALLED TO, READ BACK BY AND VERIFIED WITH: CRAWFORD,H ON 02/07/22 AT 1750 BY LOY,C Performed at [facility], [address], [place], [place] [address]   Comprehensive metabolic panel     Status: Abnormal   Collection Time: 02/07/22  5:01 PM  Result Value Ref Range   Sodium 128 (L) 135 - 145 mmol/L   Potassium 4.0 3.5 - 5.1 mmol/L   Chloride 93 (L) 98 - 111 mmol/L   CO2 21 (L) 22 - 32 mmol/L   Glucose, Bld 124 (H) 70 - 99 mg/dL    Comment: Glucose reference range applies only to samples taken after fasting for at least 8 hours.   BUN  22 (H) 6 - 20 mg/dL   Creatinine, Ser 1.38 (H) 0.61 - 1.24 mg/dL   Calcium 8.1 (L) 8.9 - 10.3 mg/dL   Total Protein 8.0 6.5 - 8.1 g/dL   Albumin 3.3 (L) 3.5 - 5.0 g/dL   AST 55 (H) 15 - 41 U/L   ALT 44 0 - 44 U/L   Alkaline Phosphatase 82 38 - 126 U/L   Total Bilirubin 0.9 0.3 - 1.2 mg/dL   GFR, Estimated >60 >60 mL/min    Comment: (NOTE) Calculated using the CKD-EPI Creatinine Equation (2021)    Anion gap 14 5 - 15    Comment: Performed at [facility], [address], [place], [place] [address]  CBC with Differential     Status: Abnormal   Collection Time: 02/07/22  5:01 PM  Result Value Ref Range   WBC 12.2 (H) 4.0 - 10.5 K/uL   RBC 5.17 4.22 - 5.81 MIL/uL   Hemoglobin 14.2 13.0 - 17.0 g/dL    HCT 43.7 39.0 - 52.0 %   MCV 84.5 80.0 - 100.0 fL   MCH 27.5 26.0 - 34.0 pg   MCHC 32.5 30.0 - 36.0 g/dL   RDW 15.9 (H) 11.5 - 15.5 %   Platelets 156 150 - 400 K/uL   nRBC 0.0 0.0 - 0.2 %   Neutrophils Relative % 91 %   Neutro Abs 11.2 (H) 1.7 - 7.7 K/uL   Lymphocytes Relative 5 %   Lymphs Abs 0.6 (L) 0.7 - 4.0 K/uL   Monocytes Relative 3 %   Monocytes Absolute 0.3 0.1 - 1.0 K/uL   Eosinophils Relative 0 %   Eosinophils Absolute 0.0 0.0 - 0.5 K/uL   Basophils Relative 0 %   Basophils Absolute 0.0 0.0 - 0.1 K/uL   Immature Granulocytes 1 %   Abs Immature Granulocytes 0.09 (H) 0.00 - 0.07 K/uL    Comment: Performed at [facility], [address], [place], [place] [address]  Blood Culture (routine x 2)     Status: Abnormal (Preliminary result)   Collection Time: 02/07/22  5:01 PM   Specimen: BLOOD RIGHT HAND  Result Value Ref Range   Specimen Description      BLOOD RIGHT HAND Performed at [facility], [address] [place], [place] [address]    Special Requests      BOTTLES DRAWN AEROBIC AND ANAEROBIC Blood Culture results may not be optimal due to an inadequate volume of blood received in culture bottles Performed at [facility], [address], [place], [place] [address]    Culture  Setup Time      GRAM POSITIVE COCCI BOTTLES DRAWN AEROBIC AND ANAEROBIC Gram Stain Report Called to,Read Back By and Verified With: GUILBEAULT,D '@0745'$  BY MATTHEWS, B 8.16.2023 IN BOTH AEROBIC AND ANAEROBIC BOTTLES    Culture STAPHYLOCOCCUS AUREUS (A)    Report Status PENDING   Blood Culture (routine x 2)     Status: Abnormal (Preliminary result)   Collection Time: 02/07/22  5:01 PM   Specimen: BLOOD RIGHT ARM  Result Value Ref Range   Specimen Description      BLOOD RIGHT ARM Performed at [facility], [address], [place], [place] [address]    Special Requests      BOTTLES DRAWN AEROBIC AND ANAEROBIC Blood Culture results may not be optimal due to an inadequate volume of blood received  in culture bottles Performed at [facility], [address] [place], [place] [address]    Culture  Setup  Time      GRAM POSITIVE COCCI BOTTLES DRAWN AEROBIC AND ANAEROBIC Gram Stain Report Called to,Read Back By and Verified With: GUILBEAULT,D'@0745'$  BY MATTHEWS, B 8.16.2023 IN BOTH AEROBIC AND ANAEROBIC BOTTLES GRAM STAIN REVIEWED-AGREE WITH RESULT CRITICAL RESULT CALLED TO, READ BACK BY AND VERIFIED WITH: PHARMD Alycia Rossetti [id number] AT 1411 BY EC    Culture (A)     STAPHYLOCOCCUS AUREUS SUSCEPTIBILITIES TO FOLLOW Performed at [facility], [place] [address], [place], [place] [address]    Report Status PENDING   Blood Culture ID Panel (Reflexed)     Status: Abnormal   Collection Time: 02/07/22  5:01 PM  Result Value Ref Range   Enterococcus faecalis NOT DETECTED NOT DETECTED   Enterococcus Faecium NOT DETECTED NOT DETECTED   Listeria monocytogenes NOT DETECTED NOT DETECTED   Staphylococcus species DETECTED (A) NOT DETECTED    Comment: CRITICAL RESULT CALLED TO, READ BACK BY AND VERIFIED WITH: PHARMD Alycia Rossetti [id number] AT 1411 BY EC    Staphylococcus aureus (BCID) DETECTED (A) NOT DETECTED    Comment: Methicillin (oxacillin)-resistant Staphylococcus aureus (MRSA). MRSA is predictably resistant to beta-lactam antibiotics (except ceftaroline). Preferred therapy is vancomycin unless clinically contraindicated. Patient requires contact precautions if  hospitalized. CRITICAL RESULT CALLED TO, READ BACK BY AND VERIFIED WITH: [place] [id number] AT 64 BY EC    Staphylococcus epidermidis NOT DETECTED NOT DETECTED   Staphylococcus lugdunensis NOT DETECTED NOT DETECTED   Streptococcus species NOT DETECTED NOT DETECTED   Streptococcus agalactiae NOT DETECTED NOT DETECTED   Streptococcus pneumoniae NOT DETECTED NOT DETECTED   Streptococcus pyogenes NOT DETECTED NOT DETECTED   A.calcoaceticus-baumannii NOT DETECTED NOT DETECTED   Bacteroides fragilis NOT DETECTED  NOT DETECTED   Enterobacterales NOT DETECTED NOT DETECTED   Enterobacter cloacae complex NOT DETECTED NOT DETECTED   Escherichia coli NOT DETECTED NOT DETECTED   Klebsiella aerogenes NOT DETECTED NOT DETECTED   Klebsiella oxytoca NOT DETECTED NOT DETECTED   Klebsiella pneumoniae NOT DETECTED NOT DETECTED   Proteus species NOT DETECTED NOT DETECTED   Salmonella species NOT DETECTED NOT DETECTED   Serratia marcescens NOT DETECTED NOT DETECTED   Haemophilus influenzae NOT DETECTED NOT DETECTED   Neisseria meningitidis NOT DETECTED NOT DETECTED   Pseudomonas aeruginosa NOT DETECTED NOT DETECTED   Stenotrophomonas maltophilia NOT DETECTED NOT DETECTED   Candida albicans NOT DETECTED NOT DETECTED   Candida auris NOT DETECTED NOT DETECTED   Candida glabrata NOT DETECTED NOT DETECTED   Candida krusei NOT DETECTED NOT DETECTED   Candida parapsilosis NOT DETECTED NOT DETECTED   Candida tropicalis NOT DETECTED NOT DETECTED   Cryptococcus neoformans/gattii NOT DETECTED NOT DETECTED   Meth resistant mecA/C and MREJ DETECTED (A) NOT DETECTED    Comment: CRITICAL RESULT CALLED TO, READ BACK BY AND VERIFIED WITH: Noemi Chapel [id number] AT 1411 BY EC Performed at [facility], [address], [place], [place] [address]   Resp Panel by RT-PCR (Flu A&B, Covid) Anterior Nasal Swab     Status: None   Collection Time: 02/07/22  5:20 PM   Specimen: Anterior Nasal Swab  Result Value Ref Range   SARS Coronavirus 2 by RT PCR NEGATIVE NEGATIVE    Comment: (NOTE) SARS-CoV-2 target nucleic acids are NOT DETECTED.  The SARS-CoV-2 RNA is generally detectable in upper respiratory specimens during the acute phase of infection. The lowest concentration of SARS-CoV-2 viral copies this assay can detect is 138 copies/mL. A negative result does not preclude SARS-Cov-2 infection  and should not be used as the sole basis for treatment or other patient management decisions. A negative result may occur  with  improper specimen collection/handling, submission of specimen other than nasopharyngeal swab, presence of viral mutation(s) within the areas targeted by this assay, and inadequate number of viral copies(<138 copies/mL). A negative result must be combined with clinical observations, patient history, and epidemiological information. The expected result is Negative.  Fact Sheet for Patients:  EntrepreneurPulse.com.au  Fact Sheet for Healthcare Providers:  IncredibleEmployment.be  This test is no t yet approved or cleared by the [place] FDA and  has been authorized for detection and/or diagnosis of SARS-CoV-2 by FDA under an Emergency Use Authorization (EUA). This EUA will remain  in effect (meaning this test can be used) for the duration of the COVID-19 declaration under Section 564(b)(1) of the Act, 21 U.S.C.section 360bbb-3(b)(1), unless the authorization is terminated  or revoked sooner.       Influenza A by PCR NEGATIVE NEGATIVE   Influenza B by PCR NEGATIVE NEGATIVE    Comment: (NOTE) The Xpert Xpress SARS-CoV-2/FLU/RSV plus assay is intended as an aid in the diagnosis of influenza from Nasopharyngeal swab specimens and should not be used as a sole basis for treatment. Nasal washings and aspirates are unacceptable for Xpert Xpress SARS-CoV-2/FLU/RSV testing.  Fact Sheet for Patients: EntrepreneurPulse.com.au  Fact Sheet for Healthcare Providers: IncredibleEmployment.be  This test is not yet approved or cleared by the [place] FDA and has been authorized for detection and/or diagnosis of SARS-CoV-2 by FDA under an Emergency Use Authorization (EUA). This EUA will remain in effect (meaning this test can be used) for the duration of the COVID-19 declaration under Section 564(b)(1) of the Act, 21 U.S.C. section 360bbb-3(b)(1), unless the authorization is terminated or revoked.  Performed  at [facility], [address], [place], [place] [address]   Protime-INR     Status: Abnormal   Collection Time: 02/07/22  6:21 PM  Result Value Ref Range   Prothrombin Time 16.4 (H) 11.4 - 15.2 seconds   INR 1.3 (H) 0.8 - 1.2    Comment: (NOTE) INR goal varies based on device and disease states. Performed at [facility], [address] [place], [place] [address]   APTT     Status: Abnormal   Collection Time: 02/07/22  6:21 PM  Result Value Ref Range   aPTT 39 (H) 24 - 36 seconds    Comment:        IF BASELINE aPTT IS ELEVATED, SUGGEST PATIENT RISK ASSESSMENT BE USED TO DETERMINE APPROPRIATE ANTICOAGULANT THERAPY. Performed at [facility], [address] [place], [place] [address]   Lactic acid, plasma     Status: None   Collection Time: 02/07/22  7:01 PM  Result Value Ref Range   Lactic Acid, Venous 1.3 0.5 - 1.9 mmol/L    Comment: Performed at [facility], [address], [place], [place] [address]  Urinalysis, Routine w reflex microscopic Urine, Clean Catch     Status: Abnormal   Collection Time: 02/07/22 11:32 PM  Result Value Ref Range   Color, Urine YELLOW YELLOW   APPearance CLEAR CLEAR   Specific Gravity, Urine 1.039 (H) 1.005 - 1.030   pH 5.0 5.0 - 8.0   Glucose, UA NEGATIVE NEGATIVE mg/dL   Hgb urine dipstick NEGATIVE NEGATIVE   Bilirubin Urine NEGATIVE NEGATIVE   Ketones, ur NEGATIVE NEGATIVE mg/dL   Protein, ur 30 (A) NEGATIVE mg/dL   Nitrite NEGATIVE NEGATIVE   Leukocytes,Ua  TRACE (A) NEGATIVE   RBC / HPF 0-5 0 - 5 RBC/hpf   WBC, UA 11-20 0 - 5 WBC/hpf   Bacteria, UA NONE SEEN NONE SEEN   Squamous Epithelial / LPF 0-5 0 - 5   Mucus PRESENT     Comment: Performed at [facility], [place] [address] [place], [place] [address]  Urine Culture     Status: None   Collection Time: 02/07/22 11:32 PM   Specimen: In/Out Cath Urine  Result Value Ref Range   Specimen Description IN/OUT CATH URINE    Special Requests NONE    Culture      NO GROWTH Performed  at [facility], [place] [address] [place], [place] [address]    Report Status 02/08/2022 FINAL   Comprehensive metabolic panel     Status: Abnormal   Collection Time: 02/08/22  4:10 AM  Result Value Ref Range   Sodium 130 (L) 135 - 145 mmol/L   Potassium 3.7 3.5 - 5.1 mmol/L   Chloride 102 98 - 111 mmol/L   CO2 21 (L) 22 - 32 mmol/L   Glucose, Bld 92 70 - 99 mg/dL    Comment: Glucose reference range applies only to samples taken after fasting for at least 8 hours.   BUN 14 6 - 20 mg/dL   Creatinine, Ser 1.06 0.61 - 1.24 mg/dL   Calcium 7.2 (L) 8.9 - 10.3 mg/dL   Total Protein 5.4 (L) 6.5 - 8.1 g/dL   Albumin 2.1 (L) 3.5 - 5.0 g/dL   AST 37 15 - 41 U/L   ALT 30 0 - 44 U/L   Alkaline Phosphatase 59 38 - 126 U/L   Total Bilirubin 0.7 0.3 - 1.2 mg/dL   GFR, Estimated >60 >60 mL/min    Comment: (NOTE) Calculated using the CKD-EPI Creatinine Equation (2021)    Anion gap 7 5 - 15    Comment: Performed at [facility], [place] [address], [place], [place] [address]  CBC with Differential/Platelet     Status: Abnormal   Collection Time: 02/08/22  4:10 AM  Result Value Ref Range   WBC 7.6 4.0 - 10.5 K/uL   RBC 4.09 (L) 4.22 - 5.81 MIL/uL   Hemoglobin 11.4 (L) 13.0 - 17.0 g/dL   HCT 33.9 (L) 39.0 - 52.0 %   MCV 82.9 80.0 - 100.0 fL   MCH 27.9 26.0 - 34.0 pg   MCHC 33.6 30.0 - 36.0 g/dL   RDW 15.9 (H) 11.5 - 15.5 %   Platelets 104 (L) 150 - 400 K/uL    Comment: REPEATED TO VERIFY   nRBC 0.0 0.0 - 0.2 %   Neutrophils Relative % 86 %   Neutro Abs 6.5 1.7 - 7.7 K/uL   Lymphocytes Relative 7 %   Lymphs Abs 0.6 (L) 0.7 - 4.0 K/uL   Monocytes Relative 4 %   Monocytes Absolute 0.3 0.1 - 1.0 K/uL   Eosinophils Relative 2 %   Eosinophils Absolute 0.1 0.0 - 0.5 K/uL   Basophils Relative 0 %   Basophils Absolute 0.0 0.0 - 0.1 K/uL   Immature Granulocytes 1 %   Abs Immature Granulocytes 0.06 0.00 - 0.07 K/uL    Comment: Performed at [facility], [place] [address]  [place], [place] [address]  Rapid urine drug screen (hospital performed)     Status: Abnormal   Collection Time: 02/08/22 10:45 AM  Result Value Ref Range   Opiates POSITIVE (A)  NONE DETECTED   Cocaine NONE DETECTED NONE DETECTED   Benzodiazepines NONE DETECTED NONE DETECTED   Amphetamines POSITIVE (A) NONE DETECTED   Tetrahydrocannabinol NONE DETECTED NONE DETECTED   Barbiturates NONE DETECTED NONE DETECTED    Comment: (NOTE) DRUG SCREEN FOR MEDICAL PURPOSES ONLY.  IF CONFIRMATION IS NEEDED FOR ANY PURPOSE, NOTIFY LAB WITHIN 5 DAYS.  LOWEST DETECTABLE LIMITS FOR URINE DRUG SCREEN Drug Class                     Cutoff (ng/mL) Amphetamine and metabolites    1000 Barbiturate and metabolites    200 Benzodiazepine                 834 Tricyclics and metabolites     300 Opiates and metabolites        300 Cocaine and metabolites        300 THC                            50 Performed at [facility], [place] [address] [place], [place] [address]   Comprehensive metabolic panel     Status: Abnormal   Collection Time: 02/09/22  4:32 AM  Result Value Ref Range   Sodium 128 (L) 135 - 145 mmol/L   Potassium 3.7 3.5 - 5.1 mmol/L   Chloride 97 (L) 98 - 111 mmol/L   CO2 24 22 - 32 mmol/L   Glucose, Bld 102 (H) 70 - 99 mg/dL    Comment: Glucose reference range applies only to samples taken after fasting for at least 8 hours.   BUN 9 6 - 20 mg/dL   Creatinine, Ser 0.80 0.61 - 1.24 mg/dL   Calcium 7.7 (L) 8.9 - 10.3 mg/dL   Total Protein 5.6 (L) 6.5 - 8.1 g/dL   Albumin 2.1 (L) 3.5 - 5.0 g/dL   AST 28 15 - 41 U/L   ALT 27 0 - 44 U/L   Alkaline Phosphatase 63 38 - 126 U/L   Total Bilirubin 0.8 0.3 - 1.2 mg/dL   GFR, Estimated >60 >60 mL/min    Comment: (NOTE) Calculated using the CKD-EPI Creatinine Equation (2021)    Anion gap 7 5 - 15    Comment: Performed at [facility], [place] [address] [place], [place] [address]  CBC with Differential/Platelet     Status: Abnormal    Collection Time: 02/09/22  4:32 AM  Result Value Ref Range   WBC 8.6 4.0 - 10.5 K/uL   RBC 4.01 (L) 4.22 - 5.81 MIL/uL   Hemoglobin 11.0 (L) 13.0 - 17.0 g/dL   HCT 32.3 (L) 39.0 - 52.0 %   MCV 80.5 80.0 - 100.0 fL   MCH 27.4 26.0 - 34.0 pg   MCHC 34.1 30.0 - 36.0 g/dL   RDW 16.1 (H) 11.5 - 15.5 %   Platelets 134 (L) 150 - 400 K/uL   nRBC 0.0 0.0 - 0.2 %   Neutrophils Relative % 78 %   Neutro Abs 6.8 1.7 - 7.7 K/uL   Lymphocytes Relative 13 %   Lymphs Abs 1.1 0.7 - 4.0 K/uL   Monocytes Relative 7 %   Monocytes Absolute 0.6 0.1 - 1.0 K/uL   Eosinophils Relative 1 %   Eosinophils Absolute 0.0 0.0 - 0.5 K/uL   Basophils Relative 0 %   Basophils Absolute 0.0 0.0 - 0.1 K/uL   Immature Granulocytes 1 %  Abs Immature Granulocytes 0.05 0.00 - 0.07 K/uL    Comment: Performed at [facility], [place] [address], [place], [place] [address]    MR HUMERUS LEFT W WO CONTRAST  Result Date: 02/09/2022 CLINICAL DATA:  Left arm infection. EXAM: MRI OF THE LEFT HUMERUS WITHOUT AND WITH CONTRAST TECHNIQUE: Multiplanar, multisequence MR imaging of the left humerus was performed before and after the administration of intravenous contrast. CONTRAST:  27m GADAVIST GADOBUTROL 1 MMOL/ML IV SOLN COMPARISON:  None Available. FINDINGS: Bones/Joint/Cartilage No marrow signal abnormality. No fracture or dislocation. Joint spaces are preserved. No joint effusion. Muscles and Tendons Intact. Mild edema in the medial triceps and biceps muscles adjacent to the brachial vessels without enhancement, likely reactive. No muscle atrophy. Soft tissue Severe circumferential soft tissue swelling and enhancement of the upper arm. There is a small 1.5 x 1.0 x 1.6 cm rim enhancing fluid collection in the medial proximal upper arm just deep to the skin surface (series 11, image 20). There is a second slightly smaller 1.5 x 0.6 x 1.2 cm rim enhancing fluid collection in the lateral chest wall/axilla (series 11, image 21). Multifocal  patchy airspace disease in the left lung. IMPRESSION: 1. Severe cellulitis of the upper arm, with two small 1.5 cm superficial abscesses in the medial proximal upper arm and lateral chest wall/axilla. Electronically Signed   By: WTitus DubinM.D.   On: 02/09/2022 07:59   ECHOCARDIOGRAM COMPLETE  Result Date: 02/08/2022    ECHOCARDIOGRAM REPORT   Patient Name:   JDEVESH MONFORTEDate of Exam: 02/08/2022 Medical Rec #:  [id number]       Height:       72.0 in Accession #:    [id number]      Weight:       164.0 lb Date of Birth:  [birth year]       BSA:          1.958 m Patient Age:    31 years         BP:           120/74 mmHg Patient Gender: M                HR:           120 bpm. Exam Location:  Inpatient Procedure: 2D Echo, Cardiac Doppler and Color Doppler Indications:    bacteremia  History:        Patient has no prior history of Echocardiogram examinations.                 Arrythmias:Tachycardia; Signs/Symptoms:Chest Pain, Shortness of                 Breath, Dyspnea and Fever.  Sonographer:    NGreer PickerelReferring Phys: [id number] 1. Left ventricular ejection fraction, by estimation, is 65 to 70%. The left ventricle has normal function. The left ventricle has no regional wall motion abnormalities. Left ventricular diastolic parameters were normal.  2. Right ventricular systolic function is normal. The right ventricular size is normal.  3. The mitral valve is normal in structure. No evidence of mitral valve regurgitation. No evidence of mitral stenosis.  4. The aortic valve has an indeterminant number of cusps. Aortic valve regurgitation is not visualized. No aortic stenosis is present.  5. The inferior vena cava is normal in size with greater than 50% respiratory variability, suggesting right atrial pressure of 3 mmHg. Comparison(s): No prior Echocardiogram.  FINDINGS  Left Ventricle: Left ventricular ejection fraction, by estimation, is 65 to 70%. The left ventricle has normal  function. The left ventricle has no regional wall motion abnormalities. The left ventricular internal cavity size was normal in size. There is  no left ventricular hypertrophy. Left ventricular diastolic parameters were normal. Right Ventricle: The right ventricular size is normal. No increase in right ventricular wall thickness. Right ventricular systolic function is normal. Left Atrium: Left atrial size was normal in size. Right Atrium: Right atrial size was normal in size. Pericardium: Trivial pericardial effusion is present. The pericardial effusion is circumferential. Mitral Valve: The mitral valve is normal in structure. No evidence of mitral valve regurgitation. No evidence of mitral valve stenosis. Tricuspid Valve: The tricuspid valve is normal in structure. Tricuspid valve regurgitation is trivial. No evidence of tricuspid stenosis. Aortic Valve: The aortic valve has an indeterminant number of cusps. Aortic valve regurgitation is not visualized. No aortic stenosis is present. Aortic valve mean gradient measures 6.0 mmHg. Aortic valve peak gradient measures 11.3 mmHg. Aortic valve area, by VTI measures 2.31 cm. Pulmonic Valve: The pulmonic valve was normal in structure. Pulmonic valve regurgitation is not visualized. No evidence of pulmonic stenosis. Aorta: The aortic root is normal in size and structure. Venous: The inferior vena cava is normal in size with greater than 50% respiratory variability, suggesting right atrial pressure of 3 mmHg. IAS/Shunts: No atrial level shunt detected by color flow Doppler.  LEFT VENTRICLE PLAX 2D LVIDd:         4.30 cm   Diastology LVIDs:         2.40 cm   LV e' medial:    12.30 cm/s LV PW:         0.97 cm   LV E/e' medial:  9.3 LV IVS:        1.00 cm   LV e' lateral:   18.50 cm/s LVOT diam:     2.10 cm   LV E/e' lateral: 6.2 LV SV:         50 LV SV Index:   26 LVOT Area:     3.46 cm  RIGHT VENTRICLE RV Basal diam:  3.00 cm RV S prime:     15.90 cm/s TAPSE (M-mode): 1.8  cm LEFT ATRIUM             Index        RIGHT ATRIUM           Index LA diam:        3.50 cm 1.79 cm/m   RA Area:     10.70 cm LA Vol (A2C):   42.4 ml 21.65 ml/m  RA Volume:   22.20 ml  11.34 ml/m LA Vol (A4C):   24.3 ml 12.41 ml/m LA Biplane Vol: 32.2 ml 16.44 ml/m  AORTIC VALVE AV Area (Vmax):    2.58 cm AV Area (Vmean):   2.52 cm AV Area (VTI):     2.31 cm AV Vmax:           168.00 cm/s AV Vmean:          113.000 cm/s AV VTI:            0.217 m AV Peak Grad:      11.3 mmHg AV Mean Grad:      6.0 mmHg LVOT Vmax:         125.00 cm/s LVOT Vmean:        82.100 cm/s LVOT VTI:  0.145 m LVOT/AV VTI ratio: 0.67  AORTA Ao Root diam: 3.50 cm Ao Asc diam:  3.10 cm MITRAL VALVE MV Area (PHT): 4.49 cm     SHUNTS MV Decel Time: 169 msec     Systemic VTI:  0.14 m MV E velocity: 114.00 cm/s  Systemic Diam: 2.10 cm MV A velocity: 92.70 cm/s MV E/A ratio:  1.23 Kardie Tobb DO Electronically signed by Berniece Salines DO Signature Date/Time: 02/08/2022/5:26:56 PM    Final    VAS [place] LOWER EXTREMITY VENOUS (DVT)  Result Date: 02/08/2022  Lower Venous DVT Study Patient Name:  Rick MCCAUSLIN  Date of Exam:   02/08/2022 Medical Rec #: [id number]         Accession #:    [id number] Date of Birth: Oct 15, 1990         Patient Gender: M Patient Age:   39 years Exam Location:  [facility] Procedure:      VAS [place] LOWER EXTREMITY VENOUS (DVT) Referring Phys: Leslye Peer --------------------------------------------------------------------------------  Indications: Swelling.  Comparison Study: No previous exam noted. Performing Technologist: Bobetta Lime BS, RVT  Examination Guidelines: A complete evaluation includes B-mode imaging, spectral Doppler, color Doppler, and power Doppler as needed of all accessible portions of each vessel. Bilateral testing is considered an integral part of a complete examination. Limited examinations for reoccurring indications may be performed as noted. The reflux portion of the exam is  performed with the patient in reverse Trendelenburg.  +---------+---------------+---------+-----------+----------+--------------+ RIGHT    CompressibilityPhasicitySpontaneityPropertiesThrombus Aging +---------+---------------+---------+-----------+----------+--------------+ CFV      Full           Yes      Yes                                 +---------+---------------+---------+-----------+----------+--------------+ SFJ      Full                                                        +---------+---------------+---------+-----------+----------+--------------+ FV Prox  Full                                                        +---------+---------------+---------+-----------+----------+--------------+ FV Mid   Full                                                        +---------+---------------+---------+-----------+----------+--------------+ FV DistalFull                                                        +---------+---------------+---------+-----------+----------+--------------+ PFV      Full                                                        +---------+---------------+---------+-----------+----------+--------------+  POP      Full           Yes      Yes                                 +---------+---------------+---------+-----------+----------+--------------+ PTV      Full                                                        +---------+---------------+---------+-----------+----------+--------------+ PERO     Full                                                        +---------+---------------+---------+-----------+----------+--------------+   +---------+---------------+---------+-----------+----------+--------------+ LEFT     CompressibilityPhasicitySpontaneityPropertiesThrombus Aging +---------+---------------+---------+-----------+----------+--------------+ CFV      Full           Yes      Yes                                  +---------+---------------+---------+-----------+----------+--------------+ SFJ      Full                                                        +---------+---------------+---------+-----------+----------+--------------+ FV Prox  Full                                                        +---------+---------------+---------+-----------+----------+--------------+ FV Mid   Full                                                        +---------+---------------+---------+-----------+----------+--------------+ FV DistalFull                                                        +---------+---------------+---------+-----------+----------+--------------+ PFV      Full                                                        +---------+---------------+---------+-----------+----------+--------------+ POP      Full           Yes      Yes                                 +---------+---------------+---------+-----------+----------+--------------+  PTV      Full                                                        +---------+---------------+---------+-----------+----------+--------------+ PERO     Full                                                        +---------+---------------+---------+-----------+----------+--------------+     Summary: BILATERAL: - No evidence of deep vein thrombosis seen in the lower extremities, bilaterally. -No evidence of popliteal cyst, bilaterally.   *See table(s) above for measurements and observations. Electronically signed by Jamelle Haring on 02/08/2022 at 4:26:09 PM.    Final    CT Chest W Contrast  Result Date: 02/07/2022 CLINICAL DATA:  Left axillary infection. Multiple lung nodule seen on chest x-ray. History of HIV. EXAM: CT CHEST WITH CONTRAST TECHNIQUE: Multidetector CT imaging of the chest was performed during intravenous contrast administration. RADIATION DOSE REDUCTION: This exam was performed according to the departmental  dose-optimization program which includes automated exposure control, adjustment of the mA and/or kV according to patient size and/or use of iterative reconstruction technique. CONTRAST:  79m OMNIPAQUE IOHEXOL 300 MG/ML  SOLN COMPARISON:  Chest x-ray from same day. CT chest dated June 17, 2021. FINDINGS: Cardiovascular: No significant vascular findings. Normal heart size. No pericardial effusion. Mediastinum/Nodes: Prominent subcentimeter mediastinal, bilateral hilar, and bilateral axillary lymph nodes have slightly decreased in size compared to prior study, and remain likely reactive. The thyroid gland, trachea, and esophagus demonstrate no significant findings. Lungs/Pleura: Scattered pulmonary nodules throughout both lungs, several of which are cavitated. Scattered mild smooth interlobular septal thickening. Trace left-greater-than-right pleural effusions. No pneumothorax. Upper Abdomen: No acute abnormality. Musculoskeletal: Asymmetric nodular skin thickening and subcutaneous edema in the left axilla. No acute or significant osseous findings. IMPRESSION: 1. Innumerable pulmonary nodules scattered throughout both lungs, several of which are cavitated. Differential considerations include septic pulmonary emboli as well as atypical bacterial or fungal pneumonia given history of HIV. 2. Asymmetric nodular skin thickening and subcutaneous edema in the left axilla, concerning for cellulitis. No discrete abscess. 3. Trace bilateral pleural effusions. Electronically Signed   By: WTitus DubinM.D.   On: 02/07/2022 18:29   DG Chest Port 1 View  Result Date: 02/07/2022 CLINICAL DATA:  Possible sepsis.  History of HIV. EXAM: PORTABLE CHEST 1 VIEW COMPARISON:  CT of the chest on 06/17/2021 FINDINGS: The heart size and mediastinal contours are within normal limits. Abnormal nodular airspace opacities in both lungs, particularly in the perihilar and lower lung zones bilaterally. Infectious etiology favored including  potential septic emboli. No associated overt pulmonary edema, pneumothorax or pleural fluid identified. The visualized skeletal structures are unremarkable. IMPRESSION: Abnormal nodular airspace opacities in both lungs. Favor infectious etiology including potential septic emboli. Consider further evaluation with CT of the chest with contrast. Electronically Signed   By: GAletta EdouardM.D.   On: 02/07/2022 17:20    Review of Systems  Constitutional:  Positive for fever.  HENT:  Negative for ear discharge, ear pain, hearing loss and tinnitus.   Eyes:  Negative for photophobia and pain.  Respiratory:  Negative for cough  and shortness of breath.   Cardiovascular:  Negative for chest pain.  Gastrointestinal:  Negative for abdominal pain, nausea and vomiting.  Genitourinary:  Negative for dysuria, flank pain, frequency and urgency.  Musculoskeletal:  Positive for myalgias (LUE). Negative for back pain and neck pain.  Neurological:  Negative for dizziness and headaches.  Hematological:  Does not bruise/bleed easily.  Psychiatric/Behavioral:  The patient is not nervous/anxious.    Blood pressure 125/67, pulse (!) 109, temperature 98 F (36.7 C), temperature source Oral, resp. rate 14, height 6' (1.829 m), weight 79.5 kg, SpO2 93 %. Physical Exam Constitutional:      General: He is not in acute distress.    Appearance: He is well-developed. He is not diaphoretic.  HENT:     Head: Normocephalic and atraumatic.  Eyes:     General: No scleral icterus.       Right eye: No discharge.        Left eye: No discharge.     Conjunctiva/sclera: Conjunctivae normal.  Cardiovascular:     Rate and Rhythm: Regular rhythm. Tachycardia present.  Pulmonary:     Effort: Pulmonary effort is normal. No respiratory distress.  Musculoskeletal:     Cervical back: Normal range of motion.     Comments: Left shoulder, elbow, wrist, digits- no skin wounds, upper arm edematous and erythematous, medial arm indurated,  mod-severe TTP, no instability, no blocks to motion  Sens  Ax/R/M/U intact  Mot   Ax/ R/ PIN/ M/ AIN/ U intact  Rad 2+  Skin:    General: Skin is warm and dry.  Neurological:     Mental Status: He is alert.  Psychiatric:        Mood and Affect: Mood normal.        Behavior: Behavior normal.     Assessment/Plan: LUE cellulitis -- Would continue to treat medically with IV abx. I do not think I&D of the axillary abscesses would enhance treatment. However, given location, may recommend general surgery consult as they do the bulk of the axillary I&D's here.    Lisette Abu, PA-C Orthopedic Surgery [phone] 02/09/2022, 10:10 AM

## 2022-02-09 NOTE — Progress Notes (Signed)
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 I do not suspect TB    He has been doing well with hiv. He came to me several months ago cachectic/dying but has gained much weight and felt well as good as he ever did on biktarvy, prior to this mrsa septic episode.    He has one value of hiv viral load in the 300k after being virologically controlled. Report compliance and I do believe him. He is due for a repeat viral load. He mentioned he missed 1 week as he grabbed a wrong bottle coming to his aunt's house a week ago to help her with chores. His cd4% has been increasing since being on biktarvy   He has positive hep c lab 05/2021 but repeat viral load (hcv genotype testing) 08/2021 was negative. Will repeat viral load again this admission   He is s/p late latent syphilis tx and rpr improving appropriately   He has had axillary and mediastinal LAD since I first saw him. I suspect this is due to his hiv. The axillary LAD seems to be improving this admission and no worsening of mediastinal LAD and there was no iris/unmasking of inflammatory symptoms with the adenopathy since he has been on biktarvy  -------------- 8/17 assessment Mri lue showed small abscesses superficially The cellulitis changes have clearly gotten much better He appears to be more somnolent this morning but no other neurologic deficit, in setting pain medication  So far no  other metastatic foci per history but will keep eye on this   Tte no obvious vavle abnormality. Will need tee  Appreciate ortho input. Will continue abx management for now     Plan: Continue biktarvy and prophylaxis bactrim  Continue vancomycin Cardiology team will plan tee tomorrow F/u repeat bcx from today 8/17 Discussed with primary team     I spent more than 35 minute reviewing data/chart, and coordinating care and >50% direct face to face time providing counseling/discussing diagnostics/treatment plan with patient   Principal Problem:   Severe sepsis ([place]) Active Problems:   AIDS (acquired immune deficiency syndrome) ([place])   Hyponatremia   Acute respiratory failure with hypoxia ([facility])   Allergies  Allergen Reactions   Banana     Other reaction(s): hives    Scheduled Meds:  bictegravir-emtricitabine-tenofovir AF  1 tablet Oral Daily   enoxaparin (LOVENOX) injection  40 mg Subcutaneous Q24H   sodium chloride flush  3 mL Intravenous Q12H   sulfamethoxazole-trimethoprim  1 tablet Oral Once per day on Mon Wed Fri   Continuous Infusions:  vancomycin     PRN Meds:.acetaminophen **OR** acetaminophen, HYDROmorphone (DILAUDID) injection, hydrOXYzine, naLOXone (NARCAN)  injection, ondansetron **OR** ondansetron (ZOFRAN) IV,  oxyCODONE, senna-docusate, traZODone   SUBJECTIVE: Tte reviewed with patient Discussed ortho plan with patient No complaint today Chest pain/arm pain still remains No other focal pain/headache/visual changes    Review of Systems: ROS All other ROS was negative, except mentioned above     OBJECTIVE: Vitals:   02/09/22 0000 02/09/22 0445 02/09/22 0900 02/09/22 1136  BP:  128/80 125/67 125/68  Pulse:  (!) 109 (!) 109 (!) 108  Resp:  '18 14 18  '$ Temp:  98.6 F (37 C) 98 F (36.7 C) 98 F (36.7 C)  TempSrc:  Oral Oral Oral  SpO2:  96% 93% 96%  Weight: 79.5 kg     Height: 6' (1.829 m)      Body mass index is 23.76 kg/m.  Physical  Exam  General/constitutional: slightly somnolent (just received oxycodone); interactive/cooperative still HEENT: Normocephalic, PER, Conj Clear, EOMI, Oropharynx clear Neck supple CV: rrr no mrg Lungs: clear to auscultation, normal respiratory effort Abd: Soft, Nontender Ext: no edema Skin: resolved erythema chest wall; improved erythema left humerus Neuro: nonfocal MSK: no peripheral joint swelling/tenderness/warmth  Lab Results Lab Results  Component Value Date   WBC 8.6 02/09/2022   HGB 11.0 (L) 02/09/2022   HCT 32.3 (L) 02/09/2022   MCV 80.5 02/09/2022   PLT 134 (L) 02/09/2022    Lab Results  Component Value Date   CREATININE 0.80 02/09/2022   BUN 9 02/09/2022   NA 128 (L) 02/09/2022   K 3.7 02/09/2022   CL 97 (L) 02/09/2022   CO2 24 02/09/2022    Lab Results  Component Value Date   ALT 27 02/09/2022   AST 28 02/09/2022   ALKPHOS 63 02/09/2022   BILITOT 0.8 02/09/2022      Microbiology: Recent Results (from the past 240 hour(s))  Blood Culture (routine x 2)     Status: Abnormal (Preliminary result)   Collection Time: 02/07/22  5:01 PM   Specimen: BLOOD RIGHT HAND  Result Value Ref Range Status   Specimen Description   Final    BLOOD RIGHT HAND Performed at [facility], [address], [place], [place] [address]    Special Requests   Final    BOTTLES DRAWN AEROBIC AND ANAEROBIC Blood Culture results may not be optimal due to an inadequate volume of blood received in culture bottles Performed at [facility], [address] [place], [place] [address]    Culture  Setup Time   Final    GRAM POSITIVE COCCI BOTTLES DRAWN AEROBIC AND ANAEROBIC Gram Stain Report Called to,Read Back By and Verified With: GUILBEAULT,D '@0745'$  BY MATTHEWS, B 8.16.2023 IN BOTH AEROBIC AND ANAEROBIC BOTTLES    Culture STAPHYLOCOCCUS AUREUS (A)  Final   Report Status PENDING  Incomplete  Blood Culture (routine x 2)     Status: Abnormal (Preliminary result)   Collection Time: 02/07/22   5:01 PM   Specimen: BLOOD RIGHT ARM  Result Value Ref Range Status   Specimen Description   Final    BLOOD RIGHT ARM Performed at [facility], [address], [place], [place] [address]    Special Requests   Final    BOTTLES DRAWN AEROBIC AND ANAEROBIC Blood Culture results may not be optimal due to an inadequate volume of blood received in culture bottles Performed at [facility], [address], [place], [place] [address]    Culture  Setup Time   Final    GRAM POSITIVE COCCI BOTTLES DRAWN AEROBIC AND ANAEROBIC Gram Stain Report Called to,Read Back By  and Verified With: GUILBEAULT,D'@0745'$  BY MATTHEWS, B 8.16.2023 IN BOTH AEROBIC AND ANAEROBIC BOTTLES GRAM STAIN REVIEWED-AGREE WITH RESULT CRITICAL RESULT CALLED TO, READ BACK BY AND VERIFIED WITH: PHARMD Alycia Rossetti [id number] AT 1411 BY EC    Culture (A)  Final    STAPHYLOCOCCUS AUREUS SUSCEPTIBILITIES TO FOLLOW Performed at [facility], [place] [address], [place], [place] [address]    Report Status PENDING  Incomplete  Blood Culture ID Panel (Reflexed)     Status: Abnormal   Collection Time: 02/07/22  5:01 PM  Result Value Ref Range Status   Enterococcus faecalis NOT DETECTED NOT DETECTED Final   Enterococcus Faecium NOT DETECTED NOT DETECTED Final   Listeria monocytogenes NOT DETECTED NOT DETECTED Final   Staphylococcus species DETECTED (A) NOT DETECTED Final    Comment: CRITICAL RESULT CALLED TO, READ BACK BY AND VERIFIED WITH: PHARMD Alycia Rossetti [id number] AT 1411 BY EC    Staphylococcus aureus (BCID) DETECTED (A) NOT DETECTED Final    Comment: Methicillin (oxacillin)-resistant Staphylococcus aureus (MRSA). MRSA is predictably resistant to beta-lactam antibiotics (except ceftaroline). Preferred therapy is vancomycin unless clinically contraindicated. Patient requires contact precautions if  hospitalized. CRITICAL RESULT CALLED TO, READ BACK BY AND VERIFIED WITH: [place] [id number] AT 61 BY EC     Staphylococcus epidermidis NOT DETECTED NOT DETECTED Final   Staphylococcus lugdunensis NOT DETECTED NOT DETECTED Final   Streptococcus species NOT DETECTED NOT DETECTED Final   Streptococcus agalactiae NOT DETECTED NOT DETECTED Final   Streptococcus pneumoniae NOT DETECTED NOT DETECTED Final   Streptococcus pyogenes NOT DETECTED NOT DETECTED Final   A.calcoaceticus-baumannii NOT DETECTED NOT DETECTED Final   Bacteroides fragilis NOT DETECTED NOT DETECTED Final   Enterobacterales NOT DETECTED NOT DETECTED Final   Enterobacter cloacae complex NOT DETECTED NOT DETECTED Final   Escherichia coli NOT DETECTED NOT DETECTED Final   Klebsiella aerogenes NOT DETECTED NOT DETECTED Final   Klebsiella oxytoca NOT DETECTED NOT DETECTED Final   Klebsiella pneumoniae NOT DETECTED NOT DETECTED Final   Proteus species NOT DETECTED NOT DETECTED Final   Salmonella species NOT DETECTED NOT DETECTED Final   Serratia marcescens NOT DETECTED NOT DETECTED Final   Haemophilus influenzae NOT DETECTED NOT DETECTED Final   Neisseria meningitidis NOT DETECTED NOT DETECTED Final   Pseudomonas aeruginosa NOT DETECTED NOT DETECTED Final   Stenotrophomonas maltophilia NOT DETECTED NOT DETECTED Final   Candida albicans NOT DETECTED NOT DETECTED Final   Candida auris NOT DETECTED NOT DETECTED Final   Candida glabrata NOT DETECTED NOT DETECTED Final   Candida krusei NOT DETECTED NOT DETECTED Final   Candida parapsilosis NOT DETECTED NOT DETECTED Final   Candida tropicalis NOT DETECTED NOT DETECTED Final   Cryptococcus neoformans/gattii NOT DETECTED NOT DETECTED Final   Meth resistant mecA/C and MREJ DETECTED (A) NOT DETECTED Final    Comment: CRITICAL RESULT CALLED TO, READ BACK BY AND VERIFIED WITH: Noemi Chapel [id number] AT 1411 BY EC Performed at [facility], [address], [place], [place] [address]   Resp Panel by RT-PCR (Flu A&B, Covid) Anterior Nasal Swab     Status: None   Collection Time:  02/07/22  5:20 PM   Specimen: Anterior Nasal Swab  Result Value Ref Range Status   SARS Coronavirus 2 by RT PCR NEGATIVE NEGATIVE Final    Comment: (NOTE) SARS-CoV-2 target nucleic acids are NOT DETECTED.  The SARS-CoV-2 RNA is generally detectable in upper respiratory specimens during the acute phase of infection. The lowest concentration  of SARS-CoV-2 viral copies this assay can detect is 138 copies/mL. A negative result does not preclude SARS-Cov-2 infection and should not be used as the sole basis for treatment or other patient management decisions. A negative result may occur with  improper specimen collection/handling, submission of specimen other than nasopharyngeal swab, presence of viral mutation(s) within the areas targeted by this assay, and inadequate number of viral copies(<138 copies/mL). A negative result must be combined with clinical observations, patient history, and epidemiological information. The expected result is Negative.  Fact Sheet for Patients:  EntrepreneurPulse.com.au  Fact Sheet for Healthcare Providers:  IncredibleEmployment.be  This test is no t yet approved or cleared by the [place] FDA and  has been authorized for detection and/or diagnosis of SARS-CoV-2 by FDA under an Emergency Use Authorization (EUA). This EUA will remain  in effect (meaning this test can be used) for the duration of the COVID-19 declaration under Section 564(b)(1) of the Act, 21 U.S.C.section 360bbb-3(b)(1), unless the authorization is terminated  or revoked sooner.       Influenza A by PCR NEGATIVE NEGATIVE Final   Influenza B by PCR NEGATIVE NEGATIVE Final    Comment: (NOTE) The Xpert Xpress SARS-CoV-2/FLU/RSV plus assay is intended as an aid in the diagnosis of influenza from Nasopharyngeal swab specimens and should not be used as a sole basis for treatment. Nasal washings and aspirates are unacceptable for Xpert Xpress  SARS-CoV-2/FLU/RSV testing.  Fact Sheet for Patients: EntrepreneurPulse.com.au  Fact Sheet for Healthcare Providers: IncredibleEmployment.be  This test is not yet approved or cleared by the [place] FDA and has been authorized for detection and/or diagnosis of SARS-CoV-2 by FDA under an Emergency Use Authorization (EUA). This EUA will remain in effect (meaning this test can be used) for the duration of the COVID-19 declaration under Section 564(b)(1) of the Act, 21 U.S.C. section 360bbb-3(b)(1), unless the authorization is terminated or revoked.  Performed at [facility], [address], [place], [place] [address]   Urine Culture     Status: None   Collection Time: 02/07/22 11:32 PM   Specimen: In/Out Cath Urine  Result Value Ref Range Status   Specimen Description IN/OUT CATH URINE  Final   Special Requests NONE  Final   Culture   Final    NO GROWTH Performed at [facility], [place] [address], [place], [place] [address]    Report Status 02/08/2022 FINAL  Final     Serology:   Imaging: If present, new imagings (plain films, ct scans, and mri) have been personally visualized and interpreted; radiology reports have been reviewed. Decision making incorporated into the Impression / Recommendations.  8/16 tte  1. Left ventricular ejection fraction, by estimation, is 65 to 70%. The  left ventricle has normal function. The left ventricle has no regional  wall motion abnormalities. Left ventricular diastolic parameters were  normal.   2. Right ventricular systolic function is normal. The right ventricular  size is normal.   3. The mitral valve is normal in structure. No evidence of mitral valve  regurgitation. No evidence of mitral stenosis.   4. The aortic valve has an indeterminant number of cusps. Aortic valve  regurgitation is not visualized. No aortic stenosis is present.   5. The inferior vena cava is normal in size with  greater than 50%  respiratory variability, suggesting right atrial pressure of 3 mmHg.    8/16 mri left humerus Soft tissue Severe circumferential soft tissue swelling and enhancement of the upper  arm. There is a small 1.5 x 1.0 x 1.6 cm rim enhancing fluid collection in the medial proximal upper arm just deep to the skin surface (series 11, image 20). There is a second slightly smaller 1.5 x 0.6 x 1.2 cm rim enhancing fluid collection in the lateral chest wall/axilla (series 11, image 21).   Multifocal patchy airspace disease in the left lung.   IMPRESSION: 1. Severe cellulitis of the upper arm, with two small 1.5 cm superficial abscesses in the medial proximal upper arm and lateral chest wall/axilla.   8/15 ct chest with contrast 1. Innumerable pulmonary nodules scattered throughout both lungs, several of which are cavitated. Differential considerations include septic pulmonary emboli as well as atypical bacterial or fungal pneumonia given history of HIV. 2. Asymmetric nodular skin thickening and subcutaneous edema in the left axilla, concerning for cellulitis. No discrete abscess. 3. Trace bilateral pleural effusions.    Jabier Mutton, [facility] for Infectious [place] [phone] pager    02/09/2022, 1:09 PM

## 2022-02-09 NOTE — Progress Notes (Signed)
   NAME:  Rick Lam, MRN:  [id number], DOB:  12-30-1990, LOS: 2 ADMISSION DATE:  02/07/2022, CONSULTATION DATE:  02/07/22 REFERRING MD:  Roderic Palau, CHIEF COMPLAINT:  fever, chest discomfort, weakness   History of Present Illness:  31yM with history of HIV/AIDS with prior KS, syphilis, chronic HCV seen in [facility] in [place]/Dr. Johnny Bridge. He reported several days of worsening redness, pain, warmth in L axilla with a number of 'squishy' lumps to PCP at virtual visit 8/14 and was prescribed a course of bactrim. Pain however became severe enough that he presented to [facility] ED today where he was found to have sepsis with likely pulmonary, cutaneous, and possible bloodstream source. There was some concern for nec fasc of left arm however surgery reviewed his imaging and felt this was unlikely. He was given 3L crystalloid, vanc, cefepime, flagyl.  At last clinic visit with Dr. Johnny Bridge had been doing well, reportedly adherent to biktarvy and had had improvement in KS-like rash, candida esophagitis and had gained weight. 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 Increase in viral load at that visit, CD4 count 78.  Pertinent  Medical History  HIV/AIDS KS Late latent syphilis, treated Chronic HCV  Significant Hospital Events: Including procedures, antibiotic start and stop dates in addition to other pertinent events   02/06/22 transfer from [facility] ED to ED, started on vanc/cefepime  Interim History / Subjective:  Feels anxious and having pain not well controlled.   Objective   Blood pressure 125/67, pulse (!) 109, temperature 98 F (36.7 C), temperature source Oral, resp. rate 14, height 6' (1.829 m), weight 79.5 kg, SpO2 93 %.        Intake/Output Summary (Last 24 hours) at 02/09/2022 1053 Last data filed at 02/09/2022 0900 Gross per 24 hour  Intake 300 ml  Output 220 ml  Net 80 ml   Filed Weights   02/07/22 1610 02/09/22 0000  Weight: 74.4 kg 79.5 kg     Examination: Tachycardic, regular Stable to slightly receded erythema from demarcations made yesterday in LUE Lungs clear with shallow respirations.  Anxious No le edema  Blood cultures positive for MRSA  Resolved Hospital Problem list    Assessment & Plan:    Acute hypoxemic respiratory insufficiency Numerous cavitating pulmonary nodules AIDS MRSA bacteremia with LUE cellulitis and high concern for acute bacterial endocarditis  TTE was reviewed and noted to be negative. He still needs TEE given high concern for ABE with MRSA bacteremia.  I think there is some slight improvement in his erythema in left upper extremity from  yesterday to today. We'll continue to follow. He may need a non-urgent airway examination for endobronchial KS later this admission depending on how he responds.   Lenice Llamas, MD Pulmonary and [place] 02/09/2022 10:55 AM Pager: see AMION  If no response to pager, please call critical care on call (see AMION) until 7pm After 7:00 pm call [place]

## 2022-02-09 NOTE — Progress Notes (Signed)
   02/08/22 2357  Assess: MEWS Score  Temp 98.8 F (37.1 C)  BP 128/79  MAP (mmHg) 92  Pulse Rate (!) 117  ECG Heart Rate (!) 120  Resp 20  Level of Consciousness Alert  SpO2 93 %  O2 Device Nasal Cannula  O2 Flow Rate (L/min) 3 L/min  Assess: MEWS Score  MEWS Temp 0  MEWS Systolic 0  MEWS Pulse 2  MEWS RR 0  MEWS LOC 0  MEWS Score 2  MEWS Score Color Yellow  Assess: if the MEWS score is Yellow or Red  Were vital signs taken at a resting state? Yes  Focused Assessment No change from prior assessment  Does the patient meet 2 or more of the SIRS criteria? Yes  Does the patient have a confirmed or suspected source of infection? Yes  Provider and Rapid Response Notified? No (admitted for sepsis)  MEWS guidelines implemented *See Row Information* No, previously yellow, continue vital signs every 4 hours  Treat  MEWS Interventions Other (Comment)  Pain Scale 0-10  Pain Score 8  Pain Type Acute pain  Pain Location Arm  Pain Orientation Left  Early Detection of Sepsis Score *See Row Information* Low  Escalate  MEWS: Escalate Yellow: discuss with charge nurse/RN and consider discussing with provider and RRT  Notify: Charge Nurse/RN  Name of Charge Nurse/RN Notified [place] RN  Date Charge Nurse/RN Notified 02/09/22  Time Charge Nurse/RN Notified 0000  Document  Patient Outcome Not stable and remains on department  Progress note created (see row info) Yes  Assess: SIRS CRITERIA  SIRS Temperature  0  SIRS Pulse 1  SIRS Respirations  0  SIRS WBC 1  SIRS Score Sum  2   Patient admitted for sepsis. This is not a change in condition, therefore MEWS protocol not implemented. Will continue q4 vitals in accordance to progressive care criteria.

## 2022-02-09 NOTE — Progress Notes (Addendum)
    [facility] has been requested to perform a transesophageal echocardiogram on Rick Lam for bacteremia. Per chart review, patient has a past medical history of HIV/AIDS, chronic hepatitis C, latent syphilis that has been treated. Patient had recently developed left axillary nodules with pain, swelling, and erythema for which he was stared on Bactrim. However, he presented to the ED on 8/15 complaining of worsening pain, redness, and swelling around the left axilla, fevers, pleuritic chest pain. He was found to have MRSA bacteremia with LEU cellulitis. Critical Care and ID are very concerned for acute bacterail endocarditis. Echocardiogram from 02/08/2022 showed EF 65-70%, no regional wall motion abnormalities, normal RV systolic function, no evidence of valvular vegetations. Requested TEE for further evaluation.   After careful review of history and examination, the risks and benefits of transesophageal echocardiogram have been explained including risks of esophageal damage, perforation (1:10,000 risk), bleeding, pharyngeal hematoma as well as other potential complications associated with conscious sedation including aspiration, arrhythmia, respiratory failure and death. Alternatives to treatment were discussed, questions were answered. Patient is willing to proceed.   Note: Patient's mother requests a call after the procedure for updates  Margie Billet, PA-C 02/09/2022 5:12 PM

## 2022-02-09 NOTE — Progress Notes (Signed)
Pharmacy Antibiotic Note  Rick Lam is a 31 y.o. male for which pharmacy has been consulted for vancomycin dosing for cellulitis.   Patient with a history of hepatitis C, HIV/AIDS, syphilis, dyspnea. Patient with outpt encounter for lt axilla abscess, for which, bactrim was prescribed 8/14. Now presenting with same.  BCID growing MRSA 8/16 and cefepime discontinued that same day. Patient's Scr has improved from 1.38>>0.80 since 8/15. Given improved renal function and possible increased clearance of vancomycin, plan to increase frequency of vanc from q12h>>q8h and check peak/trough 8/18.   Scr 0.80; WBC 8.6; afebrile   Plan: Vancomycin '1000mg'$  q8hr (eAUC 422.5 using Scr 0.8 and Vd 0.72)  Check vanco peak and trough 8/18 @ 0800 and 1330.  Trend WBC, Fever, Renal function, & Clinical course F/u cultures, clinical course, WBC   Height: 6' (182.9 cm) Weight: 79.5 kg (175 lb 3.2 oz) IBW/kg (Calculated) : 77.6  Temp (24hrs), Avg:98.8 F (37.1 C), Min:98 F (36.7 C), Max:99.7 F (37.6 C)  Recent Labs  Lab 02/07/22 1701 02/07/22 1901 02/08/22 0410 02/09/22 0432  WBC 12.2*  --  7.6 8.6  CREATININE 1.38*  --  1.06 0.80  LATICACIDVEN 4.1* 1.3  --   --     Estimated Creatinine Clearance: 144.2 mL/min (by C-G formula based on SCr of 0.8 mg/dL).    Allergies  Allergen Reactions   Banana     Other reaction(s): hives    Antimicrobials this admission: cefepime 8/15 >> 8/16 vancomycin 8/15 >> Bactrim 8/16 >> metronidazole 8/15 '500mg'$  x 1  Microbiology results: 8/15 BCID: MRSA 8/15 Ucx: NG 8/17 Bcx: in process   Thank you for allowing pharmacy to be a part of this patient's care.  Billey Gosling, PharmD PGY1 Pharmacy Resident 8/17/202310:02 AM

## 2022-02-09 NOTE — H&P (View-Only) (Signed)
    [facility] has been requested to perform a transesophageal echocardiogram on Mussa M Knoche for bacteremia. Per chart review, patient has a past medical history of HIV/AIDS, chronic hepatitis C, latent syphilis that has been treated. Patient had recently developed left axillary nodules with pain, swelling, and erythema for which he was stared on Bactrim. However, he presented to the ED on 8/15 complaining of worsening pain, redness, and swelling around the left axilla, fevers, pleuritic chest pain. He was found to have MRSA bacteremia with LEU cellulitis. Critical Care and ID are very concerned for acute bacterail endocarditis. Echocardiogram from 02/08/2022 showed EF 65-70%, no regional wall motion abnormalities, normal RV systolic function, no evidence of valvular vegetations. Requested TEE for further evaluation.   After careful review of history and examination, the risks and benefits of transesophageal echocardiogram have been explained including risks of esophageal damage, perforation (1:10,000 risk), bleeding, pharyngeal hematoma as well as other potential complications associated with conscious sedation including aspiration, arrhythmia, respiratory failure and death. Alternatives to treatment were discussed, questions were answered. Patient is willing to proceed.   Note: Patient's mother requests a call after the procedure for updates  Margie Billet, PA-C 02/09/2022 5:12 PM

## 2022-02-10 ENCOUNTER — Encounter (HOSPITAL_COMMUNITY)
Admission: EM | Disposition: A | Discharge status: Home / Self Care | Payer: Self-pay | Source: Home / Self Care | Attending: Internal Medicine

## 2022-02-10 ENCOUNTER — Inpatient Hospital Stay (HOSPITAL_COMMUNITY): Payer: Self-pay

## 2022-02-10 ENCOUNTER — Inpatient Hospital Stay (HOSPITAL_COMMUNITY): Payer: Self-pay | Admitting: Anesthesiology

## 2022-02-10 DIAGNOSIS — B20 Human immunodeficiency virus [HIV] disease: Secondary | ICD-10-CM

## 2022-02-10 DIAGNOSIS — I088 Other rheumatic multiple valve diseases: Secondary | ICD-10-CM

## 2022-02-10 DIAGNOSIS — D649 Anemia, unspecified: Secondary | ICD-10-CM

## 2022-02-10 DIAGNOSIS — B1920 Unspecified viral hepatitis C without hepatic coma: Secondary | ICD-10-CM

## 2022-02-10 DIAGNOSIS — R7881 Bacteremia: Secondary | ICD-10-CM

## 2022-02-10 HISTORY — PX: TEE WITHOUT CARDIOVERSION: SHX5443

## 2022-02-10 LAB — CBC WITH DIFFERENTIAL/PLATELET
Abs Immature Granulocytes: 0.11 10*3/uL — ABNORMAL HIGH (ref 0.00–0.07)
Basophils Absolute: 0 10*3/uL (ref 0.0–0.1)
Basophils Relative: 0 %
Eosinophils Absolute: 0 10*3/uL (ref 0.0–0.5)
Eosinophils Relative: 0 %
HCT: 32.8 % — ABNORMAL LOW (ref 39.0–52.0)
Hemoglobin: 10.8 g/dL — ABNORMAL LOW (ref 13.0–17.0)
Immature Granulocytes: 1 %
Lymphocytes Relative: 11 %
Lymphs Abs: 1.2 10*3/uL (ref 0.7–4.0)
MCH: 27.3 pg (ref 26.0–34.0)
MCHC: 32.9 g/dL (ref 30.0–36.0)
MCV: 83 fL (ref 80.0–100.0)
Monocytes Absolute: 0.8 10*3/uL (ref 0.1–1.0)
Monocytes Relative: 8 %
Neutro Abs: 8.4 10*3/uL — ABNORMAL HIGH (ref 1.7–7.7)
Neutrophils Relative %: 80 %
Platelets: 148 10*3/uL — ABNORMAL LOW (ref 150–400)
RBC: 3.95 MIL/uL — ABNORMAL LOW (ref 4.22–5.81)
RDW: 16.4 % — ABNORMAL HIGH (ref 11.5–15.5)
WBC: 10.6 10*3/uL — ABNORMAL HIGH (ref 4.0–10.5)
nRBC: 0 % (ref 0.0–0.2)

## 2022-02-10 LAB — COMPREHENSIVE METABOLIC PANEL
ALT: 20 U/L (ref 0–44)
AST: 18 U/L (ref 15–41)
Albumin: 2 g/dL — ABNORMAL LOW (ref 3.5–5.0)
Alkaline Phosphatase: 61 U/L (ref 38–126)
Anion gap: 8 (ref 5–15)
BUN: 6 mg/dL (ref 6–20)
CO2: 26 mmol/L (ref 22–32)
Calcium: 7.7 mg/dL — ABNORMAL LOW (ref 8.9–10.3)
Chloride: 95 mmol/L — ABNORMAL LOW (ref 98–111)
Creatinine, Ser: 0.65 mg/dL (ref 0.61–1.24)
GFR, Estimated: >60 mL/min (ref >60)
Glucose, Bld: 158 mg/dL — ABNORMAL HIGH (ref 70–99)
Potassium: 3.6 mmol/L (ref 3.5–5.1)
Sodium: 129 mmol/L — ABNORMAL LOW (ref 135–145)
Total Bilirubin: 0.6 mg/dL (ref 0.3–1.2)
Total Protein: 5.4 g/dL — ABNORMAL LOW (ref 6.5–8.1)

## 2022-02-10 LAB — OSMOLALITY: Osmolality: 265 mOsm/kg — ABNORMAL LOW (ref 275–295)

## 2022-02-10 LAB — CULTURE, BLOOD (ROUTINE X 2)

## 2022-02-10 LAB — URIC ACID: Uric Acid, Serum: 3.6 mg/dL — ABNORMAL LOW (ref 3.7–8.6)

## 2022-02-10 LAB — HCV RNA QUANT: HCV Quantitative: NOT DETECTED IU/mL (ref >50)

## 2022-02-10 LAB — TSH: TSH: 0.401 u[IU]/mL (ref 0.350–4.500)

## 2022-02-10 LAB — VANCOMYCIN, RANDOM: Vancomycin Rm: 21 ug/mL

## 2022-02-10 LAB — VANCOMYCIN, PEAK: Vancomycin Pk: 57 ug/mL (ref 30–40)

## 2022-02-10 LAB — HIV-1 RNA QUANT-NO REFLEX-BLD
HIV 1 RNA Quant: 16800 copies/mL
LOG10 HIV-1 RNA: 4.225 log10copy/mL

## 2022-02-10 LAB — CORTISOL: Cortisol, Plasma: 14.3 ug/dL

## 2022-02-10 LAB — VANCOMYCIN, TROUGH: Vancomycin Tr: 9 ug/mL — ABNORMAL LOW (ref 15–20)

## 2022-02-10 SURGERY — ECHOCARDIOGRAM, TRANSESOPHAGEAL
Anesthesia: Monitor Anesthesia Care

## 2022-02-10 MED ORDER — PROPOFOL 10 MG/ML IV BOLUS
INTRAVENOUS | Status: DC | PRN
  Administered 2022-02-10: 20 mg via INTRAVENOUS
  Administered 2022-02-10: 30 mg via INTRAVENOUS
  Administered 2022-02-10: 20 mg via INTRAVENOUS

## 2022-02-10 MED ORDER — VANCOMYCIN HCL 1250 MG/250ML IV SOLN
1250.0000 mg | Freq: Three times a day (TID) | INTRAVENOUS | Status: DC
  Administered 2022-02-10 – 2022-02-19 (×27): 1250 mg via INTRAVENOUS
  Filled 2022-02-10 (×28): qty 250

## 2022-02-10 MED ORDER — LIDOCAINE 2% (20 MG/ML) 5 ML SYRINGE
INTRAMUSCULAR | Status: DC | PRN
  Administered 2022-02-10: 20 mg via INTRAVENOUS

## 2022-02-10 MED ORDER — PROPOFOL 500 MG/50ML IV EMUL
INTRAVENOUS | Status: DC | PRN
  Administered 2022-02-10: 75 ug/kg/min via INTRAVENOUS

## 2022-02-10 MED ORDER — HYDROMORPHONE HCL 1 MG/ML IJ SOLN
1.0000 mg | INTRAMUSCULAR | Status: DC | PRN
  Administered 2022-02-10 – 2022-02-11 (×4): 1 mg via INTRAVENOUS
  Filled 2022-02-10 (×5): qty 1

## 2022-02-10 NOTE — Anesthesia Preprocedure Evaluation (Signed)
Anesthesia Evaluation  Patient identified by MRN, date of birth, ID band Patient awake    Reviewed: Allergy & Precautions, NPO status , Patient's Chart, lab work & pertinent test results  History of Anesthesia Complications Negative for: history of anesthetic complications  Airway Mallampati: II  TM Distance: >3 FB Neck ROM: Full    Dental no notable dental hx. (+) Dental Advisory Given   Pulmonary neg pulmonary ROS,    Pulmonary exam normal        Cardiovascular negative cardio ROS   Rhythm:Regular Rate:Tachycardia  Echo 02/08/2022 IMPRESSIONS   1. Left ventricular ejection fraction, by estimation, is 65 to 70%. The left ventricle has normal function. The left ventricle has no regional wall motion abnormalities. Left ventricular diastolic parameters were normal. 2. Right ventricular systolic function is normal. The right ventricular size is normal. 3. The mitral valve is normal in structure. No evidence of mitral valve regurgitation. No evidence of mitral stenosis. 4. The aortic valve has an indeterminant number of cusps. Aortic valve regurgitation is not visualized. No aortic stenosis is present. 5. The inferior vena cava is normal in size with greater than 50% respiratory variability, suggesting right atrial pressure of 3 mmHg.  Comparison(s): No prior Echocardiogram.   Neuro/Psych negative neurological ROS     GI/Hepatic negative GI ROS, (+) Hepatitis -, C  Endo/Other  negative endocrine ROS  Renal/GU negative Renal ROS     Musculoskeletal negative musculoskeletal ROS (+)   Abdominal   Peds  Hematology  (+) Blood dyscrasia, anemia , HIV,   Anesthesia Other Findings   Reproductive/Obstetrics                            Anesthesia Physical Anesthesia Plan  ASA: 3  Anesthesia Plan: MAC   Post-op Pain Management: Minimal or no pain anticipated   Induction:   PONV Risk  Score and Plan: 1 and Propofol infusion  Airway Management Planned: Natural Airway  Additional Equipment:   Intra-op Plan:   Post-operative Plan:   Informed Consent: I have reviewed the patients History and Physical, chart, labs and discussed the procedure including the risks, benefits and alternatives for the proposed anesthesia with the patient or authorized representative who has indicated his/her understanding and acceptance.     Dental advisory given  Plan Discussed with: Anesthesiologist and CRNA  Anesthesia Plan Comments:        Anesthesia Quick Evaluation

## 2022-02-10 NOTE — Progress Notes (Signed)
NAME:  Rick Lam, MRN:  [id number], DOB:  June 19, 1991, LOS: 3 ADMISSION DATE:  02/07/2022, CONSULTATION DATE:  02/07/2022 REFERRING MD:  Roderic Palau, CHIEF COMPLAINT:  fever, chest discomfort, weakness   History of Present Illness:  31 year old man with history of HIV/AIDS with prior KS, syphilis, chronic HCV seen in [facility] in [place]/Dr. Trung. He reported several days of worsening redness, pain, warmth in L axilla with a number of "squishy" lumps to PCP at virtual visit 8/14 and was prescribed a course of bactrim. Pain however became severe enough that he presented to [facility] ED today where he was found to have sepsis with likely pulmonary, cutaneous, and possible bloodstream source. There was some concern for nec fasc of left arm however surgery reviewed his imaging and felt this was unlikely. He was given 3L crystalloid, vanc, cefepime, flagyl.  At last clinic visit with Dr. Johnny Bridge had been doing well, reportedly adherent to biktarvy and had had improvement in KS-like rash, candida esophagitis and had gained weight. 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 Increase in viral load at that visit, CD4 count 78.  Pertinent Medical History:  HIV/AIDS [place] Late latent syphilis, treated Chronic HCV  Significant Hospital Events: Including procedures, antibiotic start and stop dates in addition to other pertinent events   02/06/22 transfer from [facility] ED to ED, started on vanc/cefepime 8/15 - CT Chest with innumerable pulmonary nodules bilaterally, several cavitated; asymmetrical skin thickening/SQ edema in L axilla c/f cellulitis. PCCM consulted. 8/16 - TTE with EF 65-70%, LE dopplers negative for DVT. MRI L humerus with severe upper arm cellulitis, two small 1.5cm abscesses. ID consulted 8/17 - Ortho consult for cellulitis, conservative management with IV abx. BCx positive for MRSA, vanc-sensitive. 8/18 - TEE with Cards, r/o vegetation. Persistent pain. Cellulitis/erythema  improving.  Interim History / Subjective:  Overnight, tachy to 130s-140s in the setting of pain, anxiety re: TEE On rounds, somnolent but wakes easily to verbal stimuli Denies pain at present, reports the only time he is not in pain is while sleeping O2 sat stable on 3LNC, no increased RR or WOB Remains on Vanc for +MRSA, cellulitis slowly resolving TEE today 1330 with Cards Mom updated at bedside  Objective:  Blood pressure 128/85, pulse 96, temperature 98 F (36.7 C), temperature source Oral, resp. rate (!) 24, height 6' (1.829 m), weight 79.5 kg, SpO2 96 %.        Intake/Output Summary (Last 24 hours) at 02/10/2022 1108 Last data filed at 02/10/2022 0900 Gross per 24 hour  Intake 1350.21 ml  Output 1000 ml  Net 350.21 ml    Filed Weights   02/07/22 1610 02/09/22 0000  Weight: 74.4 kg 79.5 kg   Physical Examination: General: Acutely ill-appearing young man in NAD. Drowsy/somnolent. HEENT: [place]/AT, anicteric sclera, PERRL, moist mucous membranes. Neuro:  Drowsy, wakes easily to voice.  Responds to verbal stimuli. Following commands consistently. Moves all 4 extremities spontaneously.  CV: RRR, no m/g/r. PULM: Breathing even and unlabored on 3LNC. Lung fields CTAB. GI: Soft, nontender, nondistended. Normoactive bowel sounds. Extremities: Trace symmetric BLE edema noted. Diffuse LUE edema noted r/t cellulitis; erythema to medial aspect of upper arm with receding diameter. +Induration noted to medial axilla. Skin: Warm/dry, erythema of LUE/axilla as above.  Assessment & Plan:   [name] is seen in consultation at the request of [place] for recommendations on management of innumerable lung nodules found on CT Chest.   31 year old man with history of HIV/AIDS  with prior KS, syphilis, chronic HCV (followed by [facility] in [place]/Dr. Johnny Bridge); presented 8/15 with LUE cellulitis, found to have MRSA bacteremia. S/p Ortho eval with recommendation for conservative management with IV  antibiotics. ID, PCCM following.   Acute hypoxemic respiratory insufficiency Numerous cavitating pulmonary nodules HIV/AIDS MRSA bacteremia with LUE cellulitis and high concern for acute bacterial endocarditis  - TTE reviewed, negative - Undergoing TEE today, 8/18 with Cardiology; r/o valvular vegetation/ABE in the setting of MRSA - Slowly improving LUE erythema/cellulitis, remains on Vanc - Consider non-urgent airway exam for endobronchial KS once recovered from MRSA bacteremia - PCCM will continue to follow  Rick Mount, PA-C [facility] 02/10/22 11:09 AM  Please see [email or web address] for pager details.  From 7A-7P if no response, please call [phone] After hours, please call ELink [phone]

## 2022-02-10 NOTE — Anesthesia Procedure Notes (Signed)
Procedure Name: MAC Date/Time: 02/10/2022 1:40 PM  Performed by: Dorann Lodge, CRNAPre-anesthesia Checklist: Patient identified, Emergency Drugs available, Suction available and Patient being monitored Patient Re-evaluated:Patient Re-evaluated prior to induction Oxygen Delivery Method: Nasal cannula Airway Equipment and Method: Bite block Dental Injury: Teeth and Oropharynx as per pre-operative assessment

## 2022-02-10 NOTE — Progress Notes (Signed)
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 I do not suspect TB    He has been doing well with hiv. He came to me several months ago cachectic/dying but has gained much weight and felt well as good as he ever did on biktarvy, prior to this mrsa septic episode.    He has one value of hiv viral load in the 300k after being virologically controlled. Report compliance and I do believe him. He is due for a repeat viral load. He mentioned he missed 1 week as he grabbed a wrong bottle coming to his aunt's house a week ago to help her with chores. His cd4% has been increasing since being on biktarvy   He has positive hep c lab 05/2021 but repeat viral load (hcv genotype testing) 08/2021 was negative. Will repeat viral load again this admission   He is s/p late latent syphilis tx and rpr improving appropriately   He has had axillary and mediastinal LAD since I first saw him. I suspect this is due to his hiv. The axillary LAD seems to be improving this admission and no worsening of mediastinal LAD and there was no iris/unmasking of inflammatory symptoms with the adenopathy since he has been on biktarvy  -------------- 8/18 assessment Mri lue showed small abscesses superficially -- ortho consulted no clear benefit for drainage at this time Some improvement with antibiotics but severe chest pain/LUE pain still Repeat bcx negative; afebrile   Tte  no obvious valve abnormality. Tee setup for today  So far no other metastatic foci per history but will keep eye on this     Plan: Continue current ART/prophy Continue vancomycin Therapeutic vanc monitoring  F/u tee F/u repeat bcx from today 8/17 Discussed with primary team    I spent more than 35 minute reviewing data/chart, and coordinating care and >50% direct face to face time providing counseling/discussing diagnostics/treatment plan with patient   Principal Problem:   Severe sepsis ([place]) Active Problems:   AIDS (acquired immune deficiency syndrome) ([place])   Hyponatremia   Acute respiratory failure with hypoxia ([place])   Acute septic pulmonary embolism without acute cor pulmonale ([facility])   Cutaneous abscess of left upper extremity   Allergies  Allergen Reactions   Banana     Other reaction(s): hives    Scheduled Meds:  bictegravir-emtricitabine-tenofovir AF  1 tablet Oral Daily   enoxaparin (LOVENOX) injection  40 mg Subcutaneous Q24H   sodium chloride flush  3 mL Intravenous Q12H   sulfamethoxazole-trimethoprim  1 tablet Oral Once per day on Mon Wed Fri   Continuous Infusions:  sodium chloride     sodium chloride 75 mL/hr at 02/09/22 2013   vancomycin  1,000 mg (02/10/22 0656)   PRN Meds:.acetaminophen **OR** acetaminophen, HYDROmorphone (DILAUDID) injection, hydrOXYzine, naLOXone (NARCAN)  injection, ondansetron **OR** ondansetron (ZOFRAN) IV, oxyCODONE, senna-docusate, traZODone   SUBJECTIVE: Patient just received some opioids for pain control Spoke with mother, patient has significant chest pleurisy and lue pain. She does agree the cellulitis changes are improving on chest/LUE She didn't report patient complains of other focal pain today    Review of Systems: ROS All other ROS was negative, except mentioned above     OBJECTIVE: Vitals:   02/09/22 1618 02/09/22 2010 02/09/22 2040 02/10/22 0830  BP: 116/71 111/64  128/85  Pulse: (!) 114   96  Resp:  11 (!) 24    Temp:    98 F (36.7 C)  TempSrc:    Oral  SpO2: 95%  92% 96%  Weight:      Height:       Body mass index is 23.76 kg/m.  Physical Exam   General/constitutional: sleeping; arousable and some cooperation with exam but quickly falling back to sleep HEENT: Normocephalic, PER, Conj Clear CV: rrr no mrg Lungs: clear to auscultation, normal respiratory effort Abd: Soft, Nontender Ext: no edema Skin: erythema/warmth/induration LUE axillary area; improved erythema on chest Neuro: deferred as he was sleeping MSK: no peripheral joint or sternoclavicular joints swelling/tenderness/warmth  Lab Results Lab Results  Component Value Date   WBC 10.6 (H) 02/10/2022   HGB 10.8 (L) 02/10/2022   HCT 32.8 (L) 02/10/2022   MCV 83.0 02/10/2022   PLT 148 (L) 02/10/2022    Lab Results  Component Value Date   CREATININE 0.65 02/10/2022   BUN 6 02/10/2022   NA 129 (L) 02/10/2022   K 3.6 02/10/2022   CL 95 (L) 02/10/2022   CO2 26 02/10/2022    Lab Results  Component Value Date   ALT 20 02/10/2022   AST 18 02/10/2022   ALKPHOS 61 02/10/2022   BILITOT 0.6 02/10/2022      Microbiology: Recent Results (from the past 240 hour(s))  Blood Culture (routine x 2)     Status: Abnormal   Collection Time: 02/07/22  5:01 PM   Specimen: BLOOD RIGHT HAND  Result Value Ref Range Status   Specimen Description   Final    BLOOD RIGHT HAND Performed at [facility], [address] [place], [place] [address]    Special Requests   Final    BOTTLES DRAWN AEROBIC AND ANAEROBIC Blood Culture results may not be optimal due to an inadequate volume of blood received in culture bottles Performed at [facility], [address] [place], [place] [address]    Culture  Setup Time   Final    GRAM POSITIVE COCCI BOTTLES DRAWN AEROBIC AND ANAEROBIC Gram Stain Report Called to,Read Back By and Verified With: GUILBEAULT,D '@0745'$  BY MATTHEWS, B 8.16.2023 IN BOTH AEROBIC AND ANAEROBIC BOTTLES    Culture  (A)  Final    STAPHYLOCOCCUS AUREUS SUSCEPTIBILITIES PERFORMED ON PREVIOUS CULTURE WITHIN THE LAST 5 DAYS. Performed at [facility], [place] [address], [place], [place] [address]    Report Status 02/10/2022 FINAL  Final  Blood Culture (routine x 2)     Status: Abnormal   Collection Time: 02/07/22  5:01 PM   Specimen: BLOOD RIGHT ARM  Result Value Ref Range Status   Specimen Description   Final    BLOOD RIGHT ARM Performed at [facility], [address], [place], [place] [address]    Special Requests   Final  BOTTLES DRAWN AEROBIC AND ANAEROBIC Blood Culture results may not be optimal due to an inadequate volume of blood received in culture bottles Performed at [facility], [address], [place], [place] [address]    Culture  Setup Time   Final    GRAM POSITIVE COCCI BOTTLES DRAWN AEROBIC AND ANAEROBIC Gram Stain Report Called to,Read Back By and Verified With: GUILBEAULT,D'@0745'$  BY MATTHEWS, B 8.16.2023 IN BOTH AEROBIC AND ANAEROBIC BOTTLES GRAM STAIN REVIEWED-AGREE WITH RESULT CRITICAL RESULT CALLED TO, READ BACK BY AND VERIFIED WITH: Noemi Chapel [phone] AT 1411 BY EC Performed at [facility], [place] [address] [place], [place] [address]    Culture METHICILLIN RESISTANT STAPHYLOCOCCUS AUREUS (A)  Final   Report Status 02/10/2022 FINAL  Final   Organism ID, Bacteria METHICILLIN RESISTANT STAPHYLOCOCCUS AUREUS  Final      Susceptibility   Methicillin resistant staphylococcus aureus - MIC*    CIPROFLOXACIN >=8 RESISTANT Resistant     ERYTHROMYCIN >=8 RESISTANT Resistant     GENTAMICIN <=0.5 SENSITIVE Sensitive     OXACILLIN >=4 RESISTANT Resistant     TETRACYCLINE >=16 RESISTANT Resistant     VANCOMYCIN <=0.5 SENSITIVE Sensitive     TRIMETH/SULFA >=320 RESISTANT Resistant     CLINDAMYCIN <=0.25 SENSITIVE Sensitive     RIFAMPIN <=0.5 SENSITIVE Sensitive     Inducible Clindamycin NEGATIVE Sensitive     * METHICILLIN RESISTANT STAPHYLOCOCCUS AUREUS  Blood  Culture ID Panel (Reflexed)     Status: Abnormal   Collection Time: 02/07/22  5:01 PM  Result Value Ref Range Status   Enterococcus faecalis NOT DETECTED NOT DETECTED Final   Enterococcus Faecium NOT DETECTED NOT DETECTED Final   Listeria monocytogenes NOT DETECTED NOT DETECTED Final   Staphylococcus species DETECTED (A) NOT DETECTED Final    Comment: CRITICAL RESULT CALLED TO, READ BACK BY AND VERIFIED WITH: PHARMD Alycia Rossetti [phone] AT 1411 BY EC    Staphylococcus aureus (BCID) DETECTED (A) NOT DETECTED Final    Comment: Methicillin (oxacillin)-resistant Staphylococcus aureus (MRSA). MRSA is predictably resistant to beta-lactam antibiotics (except ceftaroline). Preferred therapy is vancomycin unless clinically contraindicated. Patient requires contact precautions if  hospitalized. CRITICAL RESULT CALLED TO, READ BACK BY AND VERIFIED WITH: [place] [id number] AT 3235 BY EC    Staphylococcus epidermidis NOT DETECTED NOT DETECTED Final   Staphylococcus lugdunensis NOT DETECTED NOT DETECTED Final   Streptococcus species NOT DETECTED NOT DETECTED Final   Streptococcus agalactiae NOT DETECTED NOT DETECTED Final   Streptococcus pneumoniae NOT DETECTED NOT DETECTED Final   Streptococcus pyogenes NOT DETECTED NOT DETECTED Final   A.calcoaceticus-baumannii NOT DETECTED NOT DETECTED Final   Bacteroides fragilis NOT DETECTED NOT DETECTED Final   Enterobacterales NOT DETECTED NOT DETECTED Final   Enterobacter cloacae complex NOT DETECTED NOT DETECTED Final   Escherichia coli NOT DETECTED NOT DETECTED Final   Klebsiella aerogenes NOT DETECTED NOT DETECTED Final   Klebsiella oxytoca NOT DETECTED NOT DETECTED Final   Klebsiella pneumoniae NOT DETECTED NOT DETECTED Final   Proteus species NOT DETECTED NOT DETECTED Final   Salmonella species NOT DETECTED NOT DETECTED Final   Serratia marcescens NOT DETECTED NOT DETECTED Final   Haemophilus influenzae NOT DETECTED NOT DETECTED  Final   Neisseria meningitidis NOT DETECTED NOT DETECTED Final   Pseudomonas aeruginosa NOT DETECTED NOT DETECTED Final   Stenotrophomonas maltophilia NOT DETECTED NOT DETECTED Final   Candida albicans NOT DETECTED NOT DETECTED Final   Candida auris NOT DETECTED NOT DETECTED Final  Candida glabrata NOT DETECTED NOT DETECTED Final   Candida krusei NOT DETECTED NOT DETECTED Final   Candida parapsilosis NOT DETECTED NOT DETECTED Final   Candida tropicalis NOT DETECTED NOT DETECTED Final   Cryptococcus neoformans/gattii NOT DETECTED NOT DETECTED Final   Meth resistant mecA/C and MREJ DETECTED (A) NOT DETECTED Final    Comment: CRITICAL RESULT CALLED TO, READ BACK BY AND VERIFIED WITH: Noemi Chapel [phone] AT 1411 BY EC Performed at [facility], [place] [address] [place], [place] [address]   Resp Panel by RT-PCR (Flu A&B, Covid) Anterior Nasal Swab     Status: None   Collection Time: 02/07/22  5:20 PM   Specimen: Anterior Nasal Swab  Result Value Ref Range Status   SARS Coronavirus 2 by RT PCR NEGATIVE NEGATIVE Final    Comment: (NOTE) SARS-CoV-2 target nucleic acids are NOT DETECTED.  The SARS-CoV-2 RNA is generally detectable in upper respiratory specimens during the acute phase of infection. The lowest concentration of SARS-CoV-2 viral copies this assay can detect is 138 copies/mL. A negative result does not preclude SARS-Cov-2 infection and should not be used as the sole basis for treatment or other patient management decisions. A negative result may occur with  improper specimen collection/handling, submission of specimen other than nasopharyngeal swab, presence of viral mutation(s) within the areas targeted by this assay, and inadequate number of viral copies(<138 copies/mL). A negative result must be combined with clinical observations, patient history, and epidemiological information. The expected result is Negative.  Fact Sheet for Patients:   EntrepreneurPulse.com.au  Fact Sheet for Healthcare Providers:  IncredibleEmployment.be  This test is no t yet approved or cleared by the [place] FDA and  has been authorized for detection and/or diagnosis of SARS-CoV-2 by FDA under an Emergency Use Authorization (EUA). This EUA will remain  in effect (meaning this test can be used) for the duration of the COVID-19 declaration under Section 564(b)(1) of the Act, 21 U.S.C.section 360bbb-3(b)(1), unless the authorization is terminated  or revoked sooner.       Influenza A by PCR NEGATIVE NEGATIVE Final   Influenza B by PCR NEGATIVE NEGATIVE Final    Comment: (NOTE) The Xpert Xpress SARS-CoV-2/FLU/RSV plus assay is intended as an aid in the diagnosis of influenza from Nasopharyngeal swab specimens and should not be used as a sole basis for treatment. Nasal washings and aspirates are unacceptable for Xpert Xpress SARS-CoV-2/FLU/RSV testing.  Fact Sheet for Patients: EntrepreneurPulse.com.au  Fact Sheet for Healthcare Providers: IncredibleEmployment.be  This test is not yet approved or cleared by the [place] FDA and has been authorized for detection and/or diagnosis of SARS-CoV-2 by FDA under an Emergency Use Authorization (EUA). This EUA will remain in effect (meaning this test can be used) for the duration of the COVID-19 declaration under Section 564(b)(1) of the Act, 21 U.S.C. section 360bbb-3(b)(1), unless the authorization is terminated or revoked.  Performed at [facility], [address], [place], [place] [address]   Urine Culture     Status: None   Collection Time: 02/07/22 11:32 PM   Specimen: In/Out Cath Urine  Result Value Ref Range Status   Specimen Description IN/OUT CATH URINE  Final   Special Requests NONE  Final   Culture   Final    NO GROWTH Performed at [facility], [place] [address], [place], [place] [address]     Report Status 02/08/2022 FINAL  Final  Culture, blood (Routine X 2) w Reflex to ID Panel  Status: None (Preliminary result)   Collection Time: 02/09/22  4:32 AM   Specimen: BLOOD RIGHT HAND  Result Value Ref Range Status   Specimen Description BLOOD RIGHT HAND  Final   Special Requests   Final    AEROBIC BOTTLE ONLY Blood Culture results may not be optimal due to an excessive volume of blood received in culture bottles   Culture   Final    NO GROWTH 1 DAY Performed at [facility], [place] [address] [place], [place] [address]    Report Status PENDING  Incomplete  Culture, blood (Routine X 2) w Reflex to ID Panel     Status: None (Preliminary result)   Collection Time: 02/09/22  4:32 AM   Specimen: BLOOD LEFT HAND  Result Value Ref Range Status   Specimen Description BLOOD LEFT HAND  Final   Special Requests   Final    AEROBIC BOTTLE ONLY Blood Culture results may not be optimal due to an excessive volume of blood received in culture bottles   Culture   Final    NO GROWTH 1 DAY Performed at [facility], [place] [address], [place], [place] [address]    Report Status PENDING  Incomplete     Serology:   Imaging: If present, new imagings (plain films, ct scans, and mri) have been personally visualized and interpreted; radiology reports have been reviewed. Decision making incorporated into the Impression / Recommendations.  8/16 tte  1. Left ventricular ejection fraction, by estimation, is 65 to 70%. The  left ventricle has normal function. The left ventricle has no regional  wall motion abnormalities. Left ventricular diastolic parameters were  normal.   2. Right ventricular systolic function is normal. The right ventricular  size is normal.   3. The mitral valve is normal in structure. No evidence of mitral valve  regurgitation. No evidence of mitral stenosis.   4. The aortic valve has an indeterminant number of cusps. Aortic valve  regurgitation is not visualized.  No aortic stenosis is present.   5. The inferior vena cava is normal in size with greater than 50%  respiratory variability, suggesting right atrial pressure of 3 mmHg.    8/16 mri left humerus Soft tissue Severe circumferential soft tissue swelling and enhancement of the upper arm. There is a small 1.5 x 1.0 x 1.6 cm rim enhancing fluid collection in the medial proximal upper arm just deep to the skin surface (series 11, image 20). There is a second slightly smaller 1.5 x 0.6 x 1.2 cm rim enhancing fluid collection in the lateral chest wall/axilla (series 11, image 21).   Multifocal patchy airspace disease in the left lung.   IMPRESSION: 1. Severe cellulitis of the upper arm, with two small 1.5 cm superficial abscesses in the medial proximal upper arm and lateral chest wall/axilla.   8/15 ct chest with contrast 1. Innumerable pulmonary nodules scattered throughout both lungs, several of which are cavitated. Differential considerations include septic pulmonary emboli as well as atypical bacterial or fungal pneumonia given history of HIV. 2. Asymmetric nodular skin thickening and subcutaneous edema in the left axilla, concerning for cellulitis. No discrete abscess. 3. Trace bilateral pleural effusions.    Jabier Mutton, [place] [place] [phone] pager    02/10/2022, 12:41 PM

## 2022-02-10 NOTE — Progress Notes (Signed)
Pharmacy Antibiotic Note  Rick Lam is a 31 y.o. male admitted on 02/07/2022 with MRSA bacteremia.  Pharmacy has been consulted for vancomycin dosing.  The patient's vanco trough (drawn as a random level) is 9 and the peak is 21 with dose of '1000mg'$  q8h, yielding a subtherapeutic eAUC of ~390.    Plan: Increase dose of vancomycin to '1250mg'$  IV q8h (eAUC 487.9) Monitor Scr, WBC, Cx, and vanco levels   Height: 6' (182.9 cm) Weight: 79.5 kg (175 lb 3.2 oz) IBW/kg (Calculated) : 77.6  Temp (24hrs), Avg:98.2 F (36.8 C), Min:97.5 F (36.4 C), Max:98.8 F (37.1 C)  Recent Labs  Lab 02/07/22 1701 02/07/22 1901 02/08/22 0410 02/09/22 0432 02/10/22 0746 02/10/22 0926 02/10/22 1447  WBC 12.2*  --  7.6 8.6 10.6*  --   --   CREATININE 1.38*  --  1.06 0.80 0.65  --   --   LATICACIDVEN 4.1* 1.3  --   --   --   --   --   VANCOTROUGH  --   --   --   --   --   --  9*  VANCOPEAK  --   --   --   --  33*  --   --   VANCORANDOM  --   --   --   --   --  21  --      Estimated Creatinine Clearance: 144.2 mL/min (by C-G formula based on SCr of 0.65 mg/dL).    Allergies  Allergen Reactions   Banana     Other reaction(s): hives    Antimicrobials this admission: cefepime 8/15 >> 8/16 vancomycin 8/15 >> Bactrim 8/16 (for PJP ppx) >> metronidazole 8/15 '500mg'$  x 1  Dose adjustments this admission: Vanco 1000 q8h >> '1250mg'$  q8h on 8/18  Microbiology results: 8/15 BCID: MRSA 8/15 Ucx: NG 8/17 Bcx: NGTD  Thank you for allowing pharmacy to be a part of this patient's care.  Billey Gosling, PharmD PGY1 Pharmacy Resident 8/18/20234:50 PM

## 2022-02-10 NOTE — Transfer of Care (Signed)
Immediate Anesthesia Transfer of Care Note  Patient: Rick Lam  Procedure(s) Performed: TRANSESOPHAGEAL ECHOCARDIOGRAM (TEE)  Patient Location: Endoscopy Unit  Anesthesia Type:MAC  Level of Consciousness: drowsy  Airway & Oxygen Therapy: Patient Spontanous Breathing and Patient connected to nasal cannula oxygen  Post-op Assessment: Report given to RN and Post -op Vital signs reviewed and stable  Post vital signs: Reviewed and stable  Last Vitals:  Vitals Value Taken Time  BP    Temp    Pulse    Resp    SpO2      Last Pain:  Vitals:   02/10/22 1259  TempSrc: Temporal  PainSc: 8       Patients Stated Pain Goal: 1 (62/03/55 9741)  Complications: No notable events documented.

## 2022-02-10 NOTE — Progress Notes (Addendum)
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, prior KS,presents with painful left axillary nodules with surrounding erythema, admitted for sepsis    Subjective:  He is seen after returned from TEE RN reports '2mg'$  iv dilaudid made him very sleepy, dilaudid changed to 1 mg iv prn He is very drowsy currently, Mother at bedside   Assessment & Plan:  Principal Problem:   Severe sepsis ([place]) Active Problems:   AIDS (acquired immune deficiency syndrome) ([place])   Hyponatremia   Acute respiratory failure with hypoxia ([place])   Acute septic pulmonary embolism without acute cor pulmonale (HCC)   Cutaneous abscess of left upper extremity    Assessment and Plan:   Sepsis with bacteremia ,bilateral pulmonary nodules ( likely septic pulmonary emboli), left arm cellulitis, present on admission -Patient is immunosuppressed in the setting of HIV AIDS, recent CD4 29 -Blood culture from 8/15 positive for MRSA , repeat blood culture from 8/17, no growth so far -  MRI left upper extremity with small  abscesses x2, evaluated  by orhto , no plan for drainage currently -s/p TEE on 8/18 , negative for endocarditis -pulmonology recommended " repeat CT chest at end of treatment for MRSA and if lesions persist to consider outpatient bronchoscopy"and signed off on 8/18, QuantiFERON test in process - currently on vancomycin  --management per  infectious disease   Acute hypoxic respiratory failure -Likely from sepsis /take pulmonary emboli -On report O2 sat dropped to 87% on room air, currently O2 sat in the 90s on 2 L -Expect improvement with treating infection  HIV/ AIDS -Currently on Biktarvy and Bactrim prophylaxis Management per ID  Hyponatremia Could be combination of dehydration and some component of SIADH, urine is not  maximally diluted in the setting of hyponatremia Sodium slightly improved on hydration ,Continue hydration Repeat BMP in the morning  AKI Creatinine normalized        I have Reviewed nursing notes, Vitals, pain scores, I/o's, Lab results and  imaging results since pt's last encounter, details please see discussion above  I ordered the following labs:  Unresulted Labs (From admission, onward)     Start     Ordered   02/14/22 0500  Creatinine, serum  (enoxaparin (LOVENOX)    CrCl >/= 30 ml/min)  Weekly,   R     Comments: while on enoxaparin therapy    02/07/22 2353   02/11/22 0500  CBC with Differential/Platelet  Tomorrow morning,   R       Question:  Specimen collection method  Answer:  Lab=Lab collect   02/10/22 1805   02/11/22 2751  Basic metabolic panel  Tomorrow morning,   R       Question:  Specimen collection method  Answer:  Lab=Lab collect   02/10/22 1805   02/11/22 0500  Magnesium  Tomorrow morning,   R       Question:  Specimen collection method  Answer:  Lab=Lab collect   02/10/22 1805   02/09/22 0500  QuantiFERON-TB Gold Plus  Once,   R        02/09/22 0500             DVT prophylaxis: enoxaparin (LOVENOX) injection 40 mg Start: 02/08/22 0800   Code Status:   Code Status: Full Code  Family Communication: mother at bedside  daily Disposition:    Dispo: The patient is from: Home              Anticipated d/c is to: TBD              Anticipated d/c date is: Not medically stable to discharge  Antimicrobials:    Anti-infectives (From admission, onward)    Start     Dose/Rate Route Frequency Ordered Stop   02/10/22 1730  vancomycin (VANCOREADY) IVPB 1250 mg/250 mL        1,250 mg 166.7 mL/hr over 90 Minutes Intravenous Every 8 hours 02/10/22 1638     02/09/22 1400  vancomycin (VANCOCIN) IVPB 1000 mg/200 mL premix  Status:  Discontinued        1,000 mg 200 mL/hr over 60 Minutes Intravenous Every 8 hours 02/09/22 0944 02/10/22 1637   02/08/22 1000   bictegravir-emtricitabine-tenofovir AF (BIKTARVY) 50-200-25 MG per tablet 1 tablet        1 tablet Oral Daily 02/07/22 2353     02/08/22 1000  sulfamethoxazole-trimethoprim (BACTRIM DS) 800-160 MG per tablet 1 tablet  Status:  Discontinued        1 tablet Oral 2 times daily 02/07/22 2353 02/08/22 0000   02/08/22 0900  sulfamethoxazole-trimethoprim (BACTRIM DS) 800-160 MG per tablet 1 tablet        1 tablet Oral Once per day on Mon Wed Fri 02/08/22 0000     02/08/22 0700  vancomycin (VANCOCIN) IVPB 1000 mg/200 mL premix  Status:  Discontinued        1,000 mg 200 mL/hr over 60 Minutes Intravenous Every 12 hours 02/07/22 1832 02/09/22 0944   02/08/22 0300  ceFEPIme (MAXIPIME) 2 g in sodium chloride 0.9 % 100 mL IVPB  Status:  Discontinued        2 g 200 mL/hr over 30 Minutes Intravenous Every 8 hours 02/07/22 1832 02/08/22 1413   02/07/22 1915  fluconazole (DIFLUCAN) tablet 200 mg        200 mg Oral  Once 02/07/22 1906 02/07/22 1918   02/07/22 1730  vancomycin (VANCOREADY) IVPB 1500 mg/300 mL        1,500 mg 150 mL/hr over 120 Minutes Intravenous  Once 02/07/22 1638 02/07/22 2155   02/07/22 1645  ceFEPIme (MAXIPIME) 2 g in sodium chloride 0.9 % 100 mL IVPB        2 g 200 mL/hr over 30 Minutes Intravenous  Once 02/07/22 1634 02/07/22 1738   02/07/22 1645  metroNIDAZOLE (FLAGYL) IVPB 500 mg        500 mg 100 mL/hr over 60 Minutes Intravenous  Once 02/07/22 1634 02/07/22 1849   02/07/22 1645  vancomycin (VANCOCIN) IVPB 1000 mg/200 mL premix  Status:  Discontinued        1,000 mg 200 mL/hr over 60 Minutes Intravenous  Once 02/07/22 1634 02/07/22 1638   02/07/22 1645  vancomycin (VANCOCIN) IVPB 1000 mg/200 mL premix  Status:  Discontinued        1,000 mg 200 mL/hr over 60 Minutes Intravenous  Once 02/07/22 1643 02/07/22 1740   02/07/22 1645  piperacillin-tazobactam (ZOSYN) IVPB 3.375 g  Status:  Discontinued        3.375 g 100 mL/hr over 30 Minutes Intravenous  Once 02/07/22 1643 02/07/22 1740           Objective: Vitals:   02/10/22 1420 02/10/22 1432 02/10/22 1454 02/10/22 1700  BP: 114/65 107/67 112/62 112/62  Pulse: (!) 101 (!) 103 (!) 101 (!) 104  Resp: '19  16 19  '$ Temp:   (!) 97.5 F (36.4 C)   TempSrc:   Oral Oral  SpO2: 93%  96% 90%  Weight:      Height:        Intake/Output Summary (Last 24 hours) at 02/10/2022 1806 Last data filed at 02/10/2022 1700 Gross per 24 hour  Intake 1330.21 ml  Output 1750 ml  Net -419.79 ml   Filed Weights   02/07/22 1610 02/09/22 0000  Weight: 74.4 kg 79.5 kg    Examination:  General exam: Sleepy Respiratory system: tachypnea appear has improved, Respiratory effort normal. Cardiovascular system:  less sinus tachycardia Gastrointestinal system: Abdomen is nondistended, soft and nontender.  Normal bowel sounds heard. Central nervous system: sleepy, No focal neurological deficits. Extremities:  left upper arm erythema appear slightly improved  Skin: left upper arm erythema Psychiatry: Sleepy    Data Reviewed: I have personally reviewed  labs and visualized  imaging studies since the last encounter and formulate the plan        Scheduled Meds:  bictegravir-emtricitabine-tenofovir AF  1 tablet Oral Daily   enoxaparin (LOVENOX) injection  40 mg Subcutaneous Q24H   sodium chloride flush  3 mL Intravenous Q12H   sulfamethoxazole-trimethoprim  1 tablet Oral Once per day on Mon Wed Fri   Continuous Infusions:  sodium chloride 75 mL/hr at 02/09/22 2013   vancomycin 1,250 mg (02/10/22 1722)     LOS: 3 days     Florencia Reasons, MD PhD FACP [facility]  Available via Epic secure chat 7am-7pm for nonurgent issues Please page for urgent issues To page the attending provider between 7A-7P or the covering provider during after hours 7P-7A, please log into the web site [email or web address] and access using universal [place] password for that web site. If you do not have the password, please call the hospital  operator.    02/10/2022, 6:06 PM

## 2022-02-10 NOTE — Progress Notes (Addendum)
Critical vanc : 30 MD and pharm notified

## 2022-02-10 NOTE — Interval H&P Note (Signed)
History and Physical Interval Note:  02/10/2022 1:47 PM  Rick Lam  has presented today for surgery, with the diagnosis of bacteremia.  The various methods of treatment have been discussed with the patient and family. After consideration of risks, benefits and other options for treatment, the patient has consented to  Procedure(s): TRANSESOPHAGEAL ECHOCARDIOGRAM (TEE) (N/A) as a surgical intervention.  The patient's history has been reviewed, patient examined, no change in status, stable for surgery.  I have reviewed the patient's chart and labs.  Questions were answered to the patient's satisfaction.     Skeet Latch, MD

## 2022-02-10 NOTE — Progress Notes (Signed)
  Echocardiogram Echocardiogram Transesophageal has been performed.  Rick Lam 02/10/2022, 2:19 PM

## 2022-02-10 NOTE — Progress Notes (Signed)
Patient sustaining heart rate in 130s-140s on monitor. Assessed patient; patient was awake and sitting on edge of bed. No changes in rhythm identified. Denies chest pain or shortness of breath but endorses anxiety for scheduled TEE. On call hospitalist notified; verbal order for exception to NPO order to provide PRN PO atarax and page if heart rate continues to sustain above 120.

## 2022-02-10 NOTE — CV Procedure (Signed)
Brief TEE Note  LVEF 60-65% No LA/LAA thrombus or masses Trivial MR, TR and PR No evidence of endocarditis.   For additional details see full report.   Rick Lam. Rick Linsey, MD, [facility] 02/10/2022 2:01 PM

## 2022-02-11 DIAGNOSIS — L03114 Cellulitis of left upper limb: Secondary | ICD-10-CM

## 2022-02-11 DIAGNOSIS — L02414 Cutaneous abscess of left upper limb: Secondary | ICD-10-CM

## 2022-02-11 LAB — BASIC METABOLIC PANEL
Anion gap: 11 (ref 5–15)
BUN: 5 mg/dL — ABNORMAL LOW (ref 6–20)
CO2: 28 mmol/L (ref 22–32)
Calcium: 7.8 mg/dL — ABNORMAL LOW (ref 8.9–10.3)
Chloride: 92 mmol/L — ABNORMAL LOW (ref 98–111)
Creatinine, Ser: 0.57 mg/dL — ABNORMAL LOW (ref 0.61–1.24)
GFR, Estimated: >60 mL/min (ref >60)
Glucose, Bld: 108 mg/dL — ABNORMAL HIGH (ref 70–99)
Potassium: 3.6 mmol/L (ref 3.5–5.1)
Sodium: 131 mmol/L — ABNORMAL LOW (ref 135–145)

## 2022-02-11 LAB — GLUCOSE, CAPILLARY: Glucose-Capillary: 158 mg/dL — ABNORMAL HIGH (ref 70–99)

## 2022-02-11 LAB — CBC WITH DIFFERENTIAL/PLATELET
Abs Immature Granulocytes: 0.25 10*3/uL — ABNORMAL HIGH (ref 0.00–0.07)
Basophils Absolute: 0 10*3/uL (ref 0.0–0.1)
Basophils Relative: 0 %
Eosinophils Absolute: 0.1 10*3/uL (ref 0.0–0.5)
Eosinophils Relative: 1 %
HCT: 34.3 % — ABNORMAL LOW (ref 39.0–52.0)
Hemoglobin: 11.5 g/dL — ABNORMAL LOW (ref 13.0–17.0)
Immature Granulocytes: 2 %
Lymphocytes Relative: 14 %
Lymphs Abs: 1.7 10*3/uL (ref 0.7–4.0)
MCH: 27.4 pg (ref 26.0–34.0)
MCHC: 33.5 g/dL (ref 30.0–36.0)
MCV: 81.9 fL (ref 80.0–100.0)
Monocytes Absolute: 1.3 10*3/uL — ABNORMAL HIGH (ref 0.1–1.0)
Monocytes Relative: 11 %
Neutro Abs: 8.7 10*3/uL — ABNORMAL HIGH (ref 1.7–7.7)
Neutrophils Relative %: 72 %
Platelets: 185 10*3/uL (ref 150–400)
RBC: 4.19 MIL/uL — ABNORMAL LOW (ref 4.22–5.81)
RDW: 16.7 % — ABNORMAL HIGH (ref 11.5–15.5)
WBC: 12.1 10*3/uL — ABNORMAL HIGH (ref 4.0–10.5)
nRBC: 0 % (ref 0.0–0.2)

## 2022-02-11 LAB — MAGNESIUM: Magnesium: 1.9 mg/dL (ref 1.7–2.4)

## 2022-02-11 MED ORDER — GUAIFENESIN ER 600 MG PO TB12
600.0000 mg | ORAL_TABLET | Freq: Two times a day (BID) | ORAL | Status: DC
  Administered 2022-02-11 – 2022-02-21 (×21): 600 mg via ORAL
  Filled 2022-02-11 (×21): qty 1

## 2022-02-11 MED ORDER — IPRATROPIUM-ALBUTEROL 0.5-2.5 (3) MG/3ML IN SOLN
3.0000 mL | Freq: Four times a day (QID) | RESPIRATORY_TRACT | Status: DC
  Administered 2022-02-11 – 2022-02-12 (×6): 3 mL via RESPIRATORY_TRACT
  Filled 2022-02-11 (×6): qty 3

## 2022-02-11 MED ORDER — SODIUM CHLORIDE 0.9 % IV SOLN
INTRAVENOUS | Status: DC
Start: 2022-02-11 — End: 2022-02-14

## 2022-02-11 MED ORDER — FLUCONAZOLE 100MG IVPB
100.0000 mg | INTRAVENOUS | Status: DC
  Administered 2022-02-11 – 2022-02-12 (×2): 100 mg via INTRAVENOUS
  Filled 2022-02-11 (×4): qty 50

## 2022-02-11 MED ORDER — HYDROMORPHONE HCL 1 MG/ML IJ SOLN
0.5000 mg | INTRAMUSCULAR | Status: AC | PRN
  Administered 2022-02-12 – 2022-02-13 (×2): 1 mg via INTRAVENOUS
  Administered 2022-02-13: 0.5 mg via INTRAVENOUS
  Administered 2022-02-14 (×3): 1 mg via INTRAVENOUS
  Filled 2022-02-11 (×2): qty 0.5
  Filled 2022-02-11 (×5): qty 1

## 2022-02-11 NOTE — Progress Notes (Addendum)
[facility]  Date of Admission:  02/07/2022           Reason for visit: Follow up on MRSA infection  Current antibiotics: Vancomycin Biktarvy TMP SMX for OI prophylaxis  ASSESSMENT:    31 y.o. male admitted with:  MRSA bacteremia  Septic pulmonary emboli Left upper extremity/axillary cellulitis and abscess Advanced HIV disease Hx of treated syphilis Hx of hepatitis C with spontaneous clearance  RECOMMENDATIONS:    Continue vancomycin Continue Biktarvy and TMP SMX for OI prophylaxis Vanc monitoring Follow up repeat blood cx Would ask General surgery to see regarding axillary abscesses to see if he would benefit from I&D given swelling, drainage, induration after 5 days of antibiotics or perhaps repeat imaging Following  Principal Problem:   Severe sepsis ([place]) Active Problems:   AIDS (acquired immune deficiency syndrome) ([place])   Hyponatremia   Acute respiratory failure with hypoxia ([place])   Acute septic pulmonary embolism without acute cor pulmonale (HCC)   Cutaneous abscess of left upper extremity    MEDICATIONS:    Scheduled Meds:  bictegravir-emtricitabine-tenofovir AF  1 tablet Oral Daily   enoxaparin (LOVENOX) injection  40 mg Subcutaneous Q24H   guaiFENesin  600 mg Oral BID   ipratropium-albuterol  3 mL Nebulization Q6H   sodium chloride flush  3 mL Intravenous Q12H   sulfamethoxazole-trimethoprim  1 tablet Oral Once per day on Mon Wed Fri   Continuous Infusions:  vancomycin 1,250 mg (02/11/22 0909)   PRN Meds:.acetaminophen **OR** acetaminophen, HYDROmorphone (DILAUDID) injection, hydrOXYzine, naLOXone (NARCAN)  injection, ondansetron **OR** ondansetron (ZOFRAN) IV, oxyCODONE, senna-docusate, traZODone  SUBJECTIVE:     Patient reports ongoing LUE pain and swelling.  States that it is maybe a little better than yesterday.  Also reports right sided chest pain with deep inspiration.  Review of Systems  All other  systems reviewed and are negative.     OBJECTIVE:   Blood pressure 127/67, pulse (!) 114, temperature 98.6 F (37 C), temperature source Oral, resp. rate 19, height 6' (1.829 m), weight 75 kg, SpO2 90 %. Body mass index is 22.43 kg/m.  Physical Exam Constitutional:      Appearance: Normal appearance. He is ill-appearing.  HENT:     Head: Normocephalic and atraumatic.  Eyes:     Extraocular Movements: Extraocular movements intact.     Conjunctiva/sclera: Conjunctivae normal.  Pulmonary:     Effort: Pulmonary effort is normal. No respiratory distress.     Comments: On nasal cannula.  Abdominal:     General: There is no distension.     Palpations: Abdomen is soft.     Tenderness: There is no abdominal tenderness.  Musculoskeletal:        General: Swelling and tenderness present.     Cervical back: Normal range of motion and neck supple.     Comments: Left UE indurated and swollen with drainage from axilla  Skin:    General: Skin is warm and dry.     Findings: Erythema present.  Neurological:     General: No focal deficit present.     Mental Status: He is alert and oriented to person, place, and time.  Psychiatric:        Mood and Affect: Mood normal.        Behavior: Behavior normal.      Lab Results: Lab Results  Component Value Date   WBC 12.1 (H) 02/11/2022   HGB 11.5 (L) 02/11/2022   HCT 34.3 (  L) 02/11/2022   MCV 81.9 02/11/2022   PLT 185 02/11/2022    Lab Results  Component Value Date   NA 131 (L) 02/11/2022   K 3.6 02/11/2022   CO2 28 02/11/2022   GLUCOSE 108 (H) 02/11/2022   BUN 5 (L) 02/11/2022   CREATININE 0.57 (L) 02/11/2022   CALCIUM 7.8 (L) 02/11/2022   GFRNONAA >60 02/11/2022   GFRAA >60 01/16/2016    Lab Results  Component Value Date   ALT 20 02/10/2022   AST 18 02/10/2022   ALKPHOS 61 02/10/2022   BILITOT 0.6 02/10/2022    No results found for: "CRP"  No results found for: "ESRSEDRATE"   I have reviewed the micro and lab results  in Epic.  Imaging: No results found.   Imaging independently reviewed in Epic.    Raynelle [facility] for [facility]ious [facility] [phone] pager 02/11/2022, 9:56 AM

## 2022-02-11 NOTE — Consult Note (Signed)
Reason for Consult:left axillary abscess Referring Physician: [name] is an 31 y.o. male.  HPI:  Pt is a 31 yo M with HIV/AIDS who presented to the ED 02/07/2022 with left arm pain/redness and swelling as well as pleuritic chest pain and fever.    He started out with a "rash" on his left arm. This was then followed by "nodules" in the axilla with significant pain.  He had a televisit and was started on bactrim.  In the next 24 hours, he developed significant chest pain and cough and came to the Ed.    Imaging upon admission was positive for pulmonary nodules as well as cellulitis.  He was placed on IV antibiotics.  MR humerus 8/17 showed cellulitis as well with two very small superficial abscesses.    This AM he started draining from the axillary nodules.  He reports that the pain, redness, and swelling of his arm has significantly improved.  He still has chest pain and states that the right side is worse than the left.    Past Medical History:  Diagnosis Date   AIDS (acquired immune deficiency syndrome) ([facility]) 06/01/2021   dx'ed 2010. Doesn't recall cd4 nadir. Sexual transmission. 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 No fever, chill, nightsweat. Never been on bactrim   Syphilis 06/01/2021   Remember getting 1 IM shot 04/2021; 02/2021 rpr 128. Doesn't recall rash before that. No concerning sx now via  ROS as of 06/01/2021     Thrush 06/01/2021   Has had what he thinks is thrush for 3 months prior to 05/2021 visit. Lots of problem with pain when swallowing and also white plaque on tongue and throat  Also has hypertrophic nodule along gum lines  Started getting medication with spit and squish x3 separate times no effect. Received them at emergency room    History reviewed. No pertinent surgical history.  Family History  Problem Relation Age of Onset   Alcoholism Father    Diabetes Other    Stroke Other    Hypertension Other     Social History:  reports that he has never smoked. He has never used smokeless tobacco. He reports that he does not currently use alcohol. He reports that he does not use drugs.  Allergies:  Allergies  Allergen Reactions   Banana     Other reaction(s): hives    Medications:  acetaminophen (TYLENOL) suppository 650 mg L1 acetaminophen (TYLENOL) tablet 650 mg bictegravir-emtricitabine-tenofovir AF (BIKTARVY) 50-200-25 MG per tablet 1 tablet enoxaparin (LOVENOX) injection 40 mg guaiFENesin (MUCINEX) 12 hr tablet 600 mg HYDROmorphone (DILAUDID) injection 0.5-1 mg hydrOXYzine (ATARAX) tablet 25 mg ipratropium-albuterol (DUONEB) 0.5-2.5 (3) MG/3ML nebulizer solution 3 mL naloxone (NARCAN) injection 0.4 mg L2 ondansetron (ZOFRAN) injection 4 mg L2 ondansetron (ZOFRAN) tablet 4 mg oxyCODONE (Oxy IR/ROXICODONE) immediate release tablet 5 mg senna-docusate (  Senokot-S) tablet 1 tablet sodium chloride flush (NS) 0.9 % injection 3 mL sulfamethoxazole-trimethoprim (BACTRIM DS) 800-160 MG per tablet 1 tablet traZODone (DESYREL) tablet 100 mg vancomycin (VANCOREADY) IVPB 1250 mg/250 mL  Results for orders placed or performed during the hospital encounter of 02/07/22 (from the past 48 hour(s))  Sodium, urine, random     Status: None   Collection Time: 02/09/22  5:46 PM  Result Value Ref Range   Sodium, Ur 27 mmol/L    Comment: Performed at [facility], [place] [address] [place], [place] [address]  Osmolality, urine     Status: None   Collection Time: 02/09/22  5:46 PM  Result Value Ref Range   Osmolality, Ur 697 300 - 900 mOsm/kg    Comment: Performed at [facility] [address], [place], [place] [address]  Comprehensive metabolic panel     Status: Abnormal   Collection Time: 02/10/22  7:46 AM  Result Value Ref Range   Sodium 129 (L) 135 - 145 mmol/L   Potassium 3.6 3.5 - 5.1 mmol/L   Chloride 95 (L) 98 - 111 mmol/L   CO2 26 22 - 32 mmol/L   Glucose, Bld 158 (H) 70 - 99 mg/dL    Comment: Glucose reference range applies only to samples taken after fasting for at least 8 hours.   BUN 6 6 - 20 mg/dL   Creatinine, Ser 0.65 0.61 - 1.24 mg/dL   Calcium 7.7 (L) 8.9 - 10.3 mg/dL   Total Protein 5.4 (L) 6.5 - 8.1 g/dL   Albumin 2.0 (L) 3.5 - 5.0 g/dL   AST 18 15 - 41 U/L   ALT 20 0 - 44 U/L   Alkaline Phosphatase 61 38 - 126 U/L   Total Bilirubin 0.6 0.3 - 1.2 mg/dL   GFR, Estimated >60 >60 mL/min    Comment: (NOTE) Calculated using the CKD-EPI Creatinine Equation (2021)    Anion gap 8 5 - 15    Comment: Performed at [facility], [place] [address] [place], [place] [address]  CBC with Differential/Platelet     Status: Abnormal   Collection Time: 02/10/22  7:46 AM  Result Value Ref Range   WBC 10.6 (H) 4.0 - 10.5 K/uL   RBC 3.95 (L) 4.22 - 5.81 MIL/uL   Hemoglobin 10.8 (L) 13.0 - 17.0 g/dL   HCT 32.8 (L) 39.0 - 52.0 %   MCV 83.0 80.0 - 100.0 fL   MCH 27.3 26.0 - 34.0 pg   MCHC 32.9 30.0 - 36.0 g/dL   RDW 16.4 (H) 11.5 - 15.5 %   Platelets 148 (L) 150 - 400 K/uL   nRBC 0.0 0.0 - 0.2 %   Neutrophils Relative % 80 %   Neutro Abs 8.4 (H) 1.7 - 7.7 K/uL   Lymphocytes Relative 11 %   Lymphs Abs 1.2 0.7 - 4.0 K/uL   Monocytes Relative 8 %   Monocytes Absolute 0.8 0.1 - 1.0 K/uL   Eosinophils Relative 0 %   Eosinophils Absolute 0.0 0.0 - 0.5 K/uL   Basophils Relative 0 %   Basophils Absolute 0.0 0.0 - 0.1 K/uL    Immature Granulocytes 1 %   Abs Immature Granulocytes 0.11 (H) 0.00 - 0.07 K/uL    Comment: Performed at [facility] [address] [place],  [address]  Osmolality     Status: Abnormal   Collection Time: 02/10/22  7:46 AM  Result Value Ref Range  Osmolality 265 (L) 275 - 295 mOsm/kg    Comment: Performed at [facility], [place] [address], [place], [place] [address]  TSH     Status: None   Collection Time: 02/10/22  7:46 AM  Result Value Ref Range   TSH 0.401 0.350 - 4.500 uIU/mL    Comment: Performed by a 3rd Generation assay with a functional sensitivity of <=0.01 uIU/mL. Performed at [facility], [place] [address] [place], [place] [address]   Cortisol     Status: None   Collection Time: 02/10/22  7:46 AM  Result Value Ref Range   Cortisol, Plasma 14.3 ug/dL    Comment: (NOTE) AM    6.7 - 22.6 ug/dL PM   <10.0       ug/dL Performed at [facility] [address], [place], [place] [address]   Uric acid     Status: Abnormal   Collection Time: 02/10/22  7:46 AM  Result Value Ref Range   Uric Acid, Serum 3.6 (L) 3.7 - 8.6 mg/dL    Comment: Performed at [facility] [address] [place], [place] [address]  Vancomycin, peak     Status: Abnormal   Collection Time: 02/10/22  7:46 AM  Result Value Ref Range   Vancomycin Pk 57 (HH) 30 - 40 ug/mL    Comment: CRITICAL RESULT CALLED TO, READ BACK BY AND VERIFIED WITH Wilson Singer, RN [phone] 02/10/22 L. KLAR Performed at [facility], [place] [address], [place], [place] [address]   Vancomycin, random     Status: None   Collection Time: 02/10/22  9:26 AM  Result Value Ref Range   Vancomycin Rm 21 ug/mL    Comment:        Random Vancomycin therapeutic range is dependent on dosage and time of specimen collection. A peak range is 20.0-40.0 ug/mL A trough range is 5.0-15.0 ug/mL        Performed at [facility] [address], [place], [place] [address]   Vancomycin, trough     Status:  Abnormal   Collection Time: 02/10/22  2:47 PM  Result Value Ref Range   Vancomycin Tr 9 (L) 15 - 20 ug/mL    Comment: Performed at [facility], [place] [address] [place], [place] [address]  CBC with Differential/Platelet     Status: Abnormal   Collection Time: 02/11/22  3:41 AM  Result Value Ref Range   WBC 12.1 (H) 4.0 - 10.5 K/uL   RBC 4.19 (L) 4.22 - 5.81 MIL/uL   Hemoglobin 11.5 (L) 13.0 - 17.0 g/dL   HCT 34.3 (L) 39.0 - 52.0 %   MCV 81.9 80.0 - 100.0 fL   MCH 27.4 26.0 - 34.0 pg   MCHC 33.5 30.0 - 36.0 g/dL   RDW 16.7 (H) 11.5 - 15.5 %   Platelets 185 150 - 400 K/uL   nRBC 0.0 0.0 - 0.2 %   Neutrophils Relative % 72 %   Neutro Abs 8.7 (H) 1.7 - 7.7 K/uL   Lymphocytes Relative 14 %   Lymphs Abs 1.7 0.7 - 4.0 K/uL   Monocytes Relative 11 %   Monocytes Absolute 1.3 (H) 0.1 - 1.0 K/uL   Eosinophils Relative 1 %   Eosinophils Absolute 0.1 0.0 - 0.5 K/uL   Basophils Relative 0 %   Basophils Absolute 0.0 0.0 - 0.1 K/uL   Immature Granulocytes 2 %   Abs Immature Granulocytes 0.25 (H)  0.00 - 0.07 K/uL    Comment: Performed at [facility], [place] [address] [place], [place] [address]  Basic metabolic panel     Status: Abnormal   Collection Time: 02/11/22  3:41 AM  Result Value Ref Range   Sodium 131 (L) 135 - 145 mmol/L   Potassium 3.6 3.5 - 5.1 mmol/L   Chloride 92 (L) 98 - 111 mmol/L   CO2 28 22 - 32 mmol/L   Glucose, Bld 108 (H) 70 - 99 mg/dL    Comment: Glucose reference range applies only to samples taken after fasting for at least 8 hours.   BUN 5 (L) 6 - 20 mg/dL   Creatinine, Ser 0.57 (L) 0.61 - 1.24 mg/dL   Calcium 7.8 (L) 8.9 - 10.3 mg/dL   GFR, Estimated >60 >60 mL/min    Comment: (NOTE) Calculated using the CKD-EPI Creatinine Equation (2021)    Anion gap 11 5 - 15    Comment: Performed at [facility] [address], [place], [place] [address]  Magnesium     Status: None   Collection Time: 02/11/22  3:41 AM  Result Value Ref Range    Magnesium 1.9 1.7 - 2.4 mg/dL    Comment: Performed at [facility] [address], [place], [place] [address]    ECHO TEE  Result Date: 02/11/2022    TRANSESOPHOGEAL ECHO REPORT   Patient Name:   Rick Lam Date of Exam: 02/10/2022 Medical Rec #:  [id number]        Height:       72.0 in Accession #:    [id number]       Weight:       175.2 lb Date of Birth:  1990-07-23        BSA:          2.014 m Patient Age:    72 years         BP:           119/70 mmHg Patient Gender: M                HR:           100 bpm. Exam Location:  Inpatient Procedure: Transesophageal Echo Indications:     Bacteremia  History:         Patient has prior history of Echocardiogram examinations, most                  recent 02/08/2022. AIDS.  Sonographer:     Johny Chess RDCS Referring Phys:  [id number] Margie Billet Diagnosing Phys: Skeet Latch MD PROCEDURE: After discussion of the risks and benefits of a TEE, an informed consent was obtained from the patient. The transesophogeal probe was passed without difficulty through the esophogus of the patient. Imaged were obtained with the patient in a left lateral decubitus position. Sedation performed by different physician. The patient was monitored while under deep sedation. Anesthestetic sedation was provided intravenously by Anesthesiology: '203mg'$  of Propofol, '20mg'$  of Lidocaine. The patient's vital signs; including heart rate, blood pressure, and oxygen saturation; remained stable throughout the procedure. The patient developed no complications during the procedure. IMPRESSIONS  1. Left ventricular ejection fraction, by estimation, is 60 to 65%. The left ventricle has normal function. The left ventricle has no regional wall motion abnormalities.  2. Right ventricular systolic function is normal. The right ventricular size is normal.  3. No left atrial/left atrial appendage thrombus was detected.  4. The mitral valve is normal in structure. Trivial mitral valve  regurgitation. No evidence of mitral stenosis.  5. The aortic valve is tricuspid. Aortic valve regurgitation is not visualized. No aortic stenosis is present.  6. The inferior vena cava is normal in size with greater than 50% respiratory variability, suggesting right atrial pressure of 3 mmHg. Conclusion(s)/Recommendation(s): Normal biventricular function without evidence of hemodynamically significant valvular heart disease. FINDINGS  Left Ventricle: Left ventricular ejection fraction, by estimation, is 60 to 65%. The left ventricle has normal function. The left ventricle has no regional wall motion abnormalities. The left ventricular internal cavity size was normal in size. There is  no left ventricular hypertrophy. Right Ventricle: The right ventricular size is normal. No increase in right ventricular wall thickness. Right ventricular systolic function is normal. Left Atrium: Left atrial size was normal in size. No left atrial/left atrial appendage thrombus was detected. Right Atrium: Right atrial size was normal in size. Pericardium: There is no evidence of pericardial effusion. Mitral Valve: The mitral valve is normal in structure. Trivial mitral valve regurgitation. No evidence of mitral valve stenosis. Tricuspid Valve: The tricuspid valve is normal in structure. Tricuspid valve regurgitation is trivial. No evidence of tricuspid stenosis. Aortic Valve: The aortic valve is tricuspid. Aortic valve regurgitation is not visualized. No aortic stenosis is present. Pulmonic Valve: The pulmonic valve was normal in structure. Pulmonic valve regurgitation is trivial. No evidence of pulmonic stenosis. Aorta: The aortic root is normal in size and structure. Venous: The inferior vena cava is normal in size with greater than 50% respiratory variability, suggesting right atrial pressure of 3 mmHg. IAS/Shunts: No atrial level shunt detected by color flow Doppler. Skeet Latch MD Electronically signed by Skeet Latch  MD Signature Date/Time: 02/11/2022/12:04:23 PM    Final     Review of Systems  Constitutional:  Positive for fever.  Respiratory:  Positive for cough and shortness of breath.   Cardiovascular:  Positive for chest pain.  Gastrointestinal: Negative.   Endocrine: Negative.   Genitourinary: Negative.   Musculoskeletal:  Positive for joint swelling and myalgias.  Skin:  Positive for rash and wound.  Allergic/Immunologic: Negative.   Neurological: Negative.   Hematological:  Positive for adenopathy.  Psychiatric/Behavioral: Negative.    All other systems reviewed and are negative.  Blood pressure 126/77, pulse (!) 109, temperature 98.7 F (37.1 C), temperature source Oral, resp. rate 15, height 6' (1.829 m), weight 75 kg, SpO2 96 %. Physical Exam Vitals reviewed.  Constitutional:      General: He is in acute distress.     Appearance: Normal appearance. He is ill-appearing. He is not diaphoretic.  HENT:     Head: Normocephalic and atraumatic.     Right Ear: External ear normal.     Left Ear: External ear normal.     Nose: No congestion or rhinorrhea.     Mouth/Throat:     Mouth: Mucous membranes are moist.  Eyes:     Extraocular Movements: Extraocular movements intact.     Conjunctiva/sclera: Conjunctivae normal.     Pupils: Pupils are equal, round, and reactive to light.  Cardiovascular:     Rate and Rhythm: Regular rhythm. Tachycardia present.     Pulses: Normal pulses.  Pulmonary:     Effort: Respiratory distress present.  Musculoskeletal:        General: Swelling, tenderness and deformity present.     Cervical back: Normal range of motion and neck supple.  Skin:    General: Skin  is warm.     Capillary Refill: Capillary refill takes 2 to 3 seconds.     Coloration: Skin is not jaundiced or pale.     Findings: Erythema present.          Comments: Two draining areas that appear to be connected in axillary crease.  Total area around 3-4 cm linearly.  Very superficial.  No  fluctuance palpated.  Diffuse arm swelling down to hand.  Faint redness upper inner arm.  More red close to axilla.    Neurological:     General: No focal deficit present.     Mental Status: He is alert and oriented to person, place, and time.     Cranial Nerves: No cranial nerve deficit.     Sensory: No sensory deficit.     Motor: No weakness.     Coordination: Coordination normal.  Psychiatric:        Mood and Affect: Mood normal.        Behavior: Behavior normal.        Thought Content: Thought content normal.        Judgment: Judgment normal.     Assessment/Plan: Left arm/axillary cellulitis Small axillary abscesses.  Abscesses are superficial and have already drained quite a bit today.   Recommend warm compresses and continue IV antibiotics.  Pt states pain and redness has significantly improved since his arrival.    Would not recommend any debridement or drainage at this time.  Infection appears to be resolving.  Pt is too tender to do any bedside procedure, and fear general anesthesia would be bad for pulmonary process.       Stark Klein 02/11/2022, 3:41 PM

## 2022-02-11 NOTE — Progress Notes (Signed)
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  Presented with painful left axillary nodules with surrounding erythema he was admitted with sepsis, found to have MRSA bacteremia secondary to axillary abscess and cellulitis.  He underwent TEE 8/18 which was negative for endocarditis.  ID is following.   Assessment & Plan:   Principal Problem:   Severe sepsis ([place]) Active Problems:   AIDS (acquired immune deficiency syndrome) (HCC)   Hyponatremia   Acute respiratory failure with hypoxia (HCC)   Acute septic pulmonary embolism without acute cor pulmonale (HCC)   Cutaneous abscess of left upper extremity   1-Sepsis secondary to MRSA bacteremia,  bilateral likely pulmonary septic emboli, left arm cellulitis and abscess -Patient immunosuppressed, in the setting of HIV/AIDS. -Blood cultures from 8/15 positive for MRSA. -Repeated blood culture 8/17 no growth to date. -MRI left upper extremity with a small abscess x2 evaluated by Ortho, did not recommend drainage at that time. -TEE 8/18 negative for endocarditis -Pulmonology recommended repeat CT chest at the end of treatment for MRSA if lesions persist consider outpatient bronchoscopy -Continue with vancomycin -Surgery consulted for further evaluation of axillary abscess, might need drainage.  Acute hypoxic respiratory failure: Bilateral pulmonary nodules, concern with septic emboli Continue with oxygen supplementation, currently 2 L Incentive spirometry Start guaifenesin and nebulizers  HIV/AIDS: Continue with Biktarvy and Bactrim ID following, appreciate assistance  Hyponatremia: Could be combination of dehydration and some component of SIADH Sodium improved with hydration.  AKI: Resolved  Estimated body  mass index is 22.43 kg/m as calculated from the following:   Height as of this encounter: 6' (1.829 m).   Weight as of this encounter: 75 kg.   DVT prophylaxis: Lovenox Code Status: Full code Family Communication: Care discussed with patient Disposition Plan:  Status is: Inpatient Remains inpatient appropriate because: Continue with management of MRSA bacteremia, axillary abscess, bilateral pulmonary septic emboli.     Consultants:  ID Surgery   Procedures:  TEE 8/18 negative for endocarditis.   Antimicrobials:  Vancomycin   Subjective: He is sleepy, answer questions, still complaining of chest pain, dyspnea stable.  He report axillary pain.   Objective: Vitals:   02/10/22 1700 02/10/22 2028 02/11/22 0104 02/11/22 0311  BP: 112/62 127/77 125/74 135/82  Pulse: (!) 104 90 100 (!) 109  Resp: '19 16 16 16  '$ Temp:  97.8 F (36.6 C) 97.8 F (36.6 C) 97.8 F (36.6 C)  TempSrc: Oral Oral Oral Oral  SpO2: 90% 95% 98% 91%  Weight:    75 kg  Height:        Intake/Output Summary (Last 24 hours) at 02/11/2022 0704 Last data filed at 02/11/2022 0313 Gross per 24 hour  Intake 540 ml  Output 1600 ml  Net -1060 ml   Filed Weights   02/07/22 1610 02/09/22 0000 02/11/22 0311  Weight: 74.4 kg 79.5 kg 75 kg    Examination:  General exam: Appears calm and comfortable  Respiratory system: Clear to auscultation. Respiratory effort normal. Cardiovascular system: S1 & S2 heard, RRR. No JVD, murmurs, rubs, gallops or clicks. No pedal edema. Gastrointestinal system: Abdomen is nondistended, soft and nontender. No organomegaly or masses felt. Normal bowel sounds heard. Central nervous system: Alert and oriented. No focal neurological deficits. Extremities: Symmetric 5 x 5 power. Skin: No  rashes, lesions or ulcers Psychiatry: Judgement and insight appear normal. Mood & affect appropriate.     Data Reviewed: I have personally reviewed following labs and imaging  studies  CBC: Recent Labs  Lab 02/07/22 1701 02/08/22 0410 02/09/22 0432 02/10/22 0746 02/11/22 0341  WBC 12.2* 7.6 8.6 10.6* 12.1*  NEUTROABS 11.2* 6.5 6.8 8.4* 8.7*  HGB 14.2 11.4* 11.0* 10.8* 11.5*  HCT 43.7 33.9* 32.3* 32.8* 34.3*  MCV 84.5 82.9 80.5 83.0 81.9  PLT 156 104* 134* 148* 497   Basic Metabolic Panel: Recent Labs  Lab 02/07/22 1701 02/08/22 0410 02/09/22 0432 02/10/22 0746 02/11/22 0341  NA 128* 130* 128* 129* 131*  K 4.0 3.7 3.7 3.6 3.6  CL 93* 102 97* 95* 92*  CO2 21* 21* '24 26 28  '$ GLUCOSE 124* 92 102* 158* 108*  BUN 22* '14 9 6 '$ 5*  CREATININE 1.38* 1.06 0.80 0.65 0.57*  CALCIUM 8.1* 7.2* 7.7* 7.7* 7.8*  MG  --   --   --   --  1.9   GFR: Estimated Creatinine Clearance: 139.3 mL/min (A) (by C-G formula based on SCr of 0.57 mg/dL (L)). Liver Function Tests: Recent Labs  Lab 02/07/22 1701 02/08/22 0410 02/09/22 0432 02/10/22 0746  AST 55* 37 28 18  ALT 44 '30 27 20  '$ ALKPHOS 82 59 63 61  BILITOT 0.9 0.7 0.8 0.6  PROT 8.0 5.4* 5.6* 5.4*  ALBUMIN 3.3* 2.1* 2.1* 2.0*   No results for input(s): "LIPASE", "AMYLASE" in the last 168 hours. No results for input(s): "AMMONIA" in the last 168 hours. Coagulation Profile: Recent Labs  Lab 02/07/22 1821  INR 1.3*   Cardiac Enzymes: No results for input(s): "CKTOTAL", "CKMB", "CKMBINDEX", "TROPONINI" in the last 168 hours. BNP (last 3 results) No results for input(s): "PROBNP" in the last 8760 hours. HbA1C: No results for input(s): "HGBA1C" in the last 72 hours. CBG: No results for input(s): "GLUCAP" in the last 168 hours. Lipid Profile: No results for input(s): "CHOL", "HDL", "LDLCALC", "TRIG", "CHOLHDL", "LDLDIRECT" in the last 72 hours. Thyroid Function Tests: Recent Labs    02/10/22 0746  TSH 0.401   Anemia Panel: No results for input(s): "VITAMINB12", "FOLATE", "FERRITIN", "TIBC", "IRON", "RETICCTPCT" in the last 72 hours. Sepsis Labs: Recent Labs  Lab 02/07/22 1701 02/07/22 1901   LATICACIDVEN 4.1* 1.3    Recent Results (from the past 240 hour(s))  Blood Culture (routine x 2)     Status: Abnormal   Collection Time: 02/07/22  5:01 PM   Specimen: BLOOD RIGHT HAND  Result Value Ref Range Status   Specimen Description   Final    BLOOD RIGHT HAND Performed at [facility], [address], [place], [place] [address]    Special Requests   Final    BOTTLES DRAWN AEROBIC AND ANAEROBIC Blood Culture results may not be optimal due to an inadequate volume of blood received in culture bottles Performed at [facility], [address], [place], [place] [address]    Culture  Setup Time   Final    GRAM POSITIVE COCCI BOTTLES DRAWN AEROBIC AND ANAEROBIC Gram Stain Report Called to,Read Back By and Verified With: GUILBEAULT,D '@0745'$  BY MATTHEWS, B 8.16.2023 IN BOTH AEROBIC AND ANAEROBIC BOTTLES    Culture (A)  Final    STAPHYLOCOCCUS AUREUS SUSCEPTIBILITIES PERFORMED ON PREVIOUS CULTURE WITHIN THE LAST 5 DAYS. Performed at [facility], [place] [address], [place], [place] [address]    Report Status 02/10/2022 FINAL  Final  Blood Culture (  routine x 2)     Status: Abnormal   Collection Time: 02/07/22  5:01 PM   Specimen: BLOOD RIGHT ARM  Result Value Ref Range Status   Specimen Description   Final    BLOOD RIGHT ARM Performed at [facility], [address], [place], [place] [address]    Special Requests   Final    BOTTLES DRAWN AEROBIC AND ANAEROBIC Blood Culture results may not be optimal due to an inadequate volume of blood received in culture bottles Performed at [place] [place], [place] [address]    Culture  Setup Time   Final    GRAM POSITIVE COCCI BOTTLES DRAWN AEROBIC AND ANAEROBIC Gram Stain Report Called to,Read Back By and Verified With: GUILBEAULT,D'@0745'$  BY MATTHEWS, B 8.16.2023 IN BOTH AEROBIC AND ANAEROBIC BOTTLES GRAM STAIN REVIEWED-AGREE WITH RESULT CRITICAL RESULT CALLED TO, READ BACK BY AND VERIFIED WITH: Noemi Chapel  [id number] AT 1411 BY EC Performed at [facility], [place] [address], [place], [place] [address]    Culture METHICILLIN RESISTANT STAPHYLOCOCCUS AUREUS (A)  Final   Report Status 02/10/2022 FINAL  Final   Organism ID, Bacteria METHICILLIN RESISTANT STAPHYLOCOCCUS AUREUS  Final      Susceptibility   Methicillin resistant staphylococcus aureus - MIC*    CIPROFLOXACIN >=8 RESISTANT Resistant     ERYTHROMYCIN >=8 RESISTANT Resistant     GENTAMICIN <=0.5 SENSITIVE Sensitive     OXACILLIN >=4 RESISTANT Resistant     TETRACYCLINE >=16 RESISTANT Resistant     VANCOMYCIN <=0.5 SENSITIVE Sensitive     TRIMETH/SULFA >=320 RESISTANT Resistant     CLINDAMYCIN <=0.25 SENSITIVE Sensitive     RIFAMPIN <=0.5 SENSITIVE Sensitive     Inducible Clindamycin NEGATIVE Sensitive     * METHICILLIN RESISTANT STAPHYLOCOCCUS AUREUS  Blood Culture ID Panel (Reflexed)     Status: Abnormal   Collection Time: 02/07/22  5:01 PM  Result Value Ref Range Status   Enterococcus faecalis NOT DETECTED NOT DETECTED Final   Enterococcus Faecium NOT DETECTED NOT DETECTED Final   Listeria monocytogenes NOT DETECTED NOT DETECTED Final   Staphylococcus species DETECTED (A) NOT DETECTED Final    Comment: CRITICAL RESULT CALLED TO, READ BACK BY AND VERIFIED WITH: PHARMD Alycia Rossetti [id number] AT 1411 BY EC    Staphylococcus aureus (BCID) DETECTED (A) NOT DETECTED Final    Comment: Methicillin (oxacillin)-resistant Staphylococcus aureus (MRSA). MRSA is predictably resistant to beta-lactam antibiotics (except ceftaroline). Preferred therapy is vancomycin unless clinically contraindicated. Patient requires contact precautions if  hospitalized. CRITICAL RESULT CALLED TO, READ BACK BY AND VERIFIED WITH: [place] [id number] AT 7169 BY EC    Staphylococcus epidermidis NOT DETECTED NOT DETECTED Final   Staphylococcus lugdunensis NOT DETECTED NOT DETECTED Final   Streptococcus species NOT DETECTED NOT DETECTED Final    Streptococcus agalactiae NOT DETECTED NOT DETECTED Final   Streptococcus pneumoniae NOT DETECTED NOT DETECTED Final   Streptococcus pyogenes NOT DETECTED NOT DETECTED Final   A.calcoaceticus-baumannii NOT DETECTED NOT DETECTED Final   Bacteroides fragilis NOT DETECTED NOT DETECTED Final   Enterobacterales NOT DETECTED NOT DETECTED Final   Enterobacter cloacae complex NOT DETECTED NOT DETECTED Final   Escherichia coli NOT DETECTED NOT DETECTED Final   Klebsiella aerogenes NOT DETECTED NOT DETECTED Final   Klebsiella oxytoca NOT DETECTED NOT DETECTED Final   Klebsiella pneumoniae NOT DETECTED NOT DETECTED Final   Proteus species NOT DETECTED NOT DETECTED Final   Salmonella species NOT DETECTED NOT DETECTED Final  Serratia marcescens NOT DETECTED NOT DETECTED Final   Haemophilus influenzae NOT DETECTED NOT DETECTED Final   Neisseria meningitidis NOT DETECTED NOT DETECTED Final   Pseudomonas aeruginosa NOT DETECTED NOT DETECTED Final   Stenotrophomonas maltophilia NOT DETECTED NOT DETECTED Final   Candida albicans NOT DETECTED NOT DETECTED Final   Candida auris NOT DETECTED NOT DETECTED Final   Candida glabrata NOT DETECTED NOT DETECTED Final   Candida krusei NOT DETECTED NOT DETECTED Final   Candida parapsilosis NOT DETECTED NOT DETECTED Final   Candida tropicalis NOT DETECTED NOT DETECTED Final   Cryptococcus neoformans/gattii NOT DETECTED NOT DETECTED Final   Meth resistant mecA/C and MREJ DETECTED (A) NOT DETECTED Final    Comment: CRITICAL RESULT CALLED TO, READ BACK BY AND VERIFIED WITH: Noemi Chapel [id number] AT 1411 BY EC Performed at [facility], [place] [address], [place], [place] [address]   Resp Panel by RT-PCR (Flu A&B, Covid) Anterior Nasal Swab     Status: None   Collection Time: 02/07/22  5:20 PM   Specimen: Anterior Nasal Swab  Result Value Ref Range Status   SARS Coronavirus 2 by RT PCR NEGATIVE NEGATIVE Final    Comment: (NOTE) SARS-CoV-2 target  nucleic acids are NOT DETECTED.  The SARS-CoV-2 RNA is generally detectable in upper respiratory specimens during the acute phase of infection. The lowest concentration of SARS-CoV-2 viral copies this assay can detect is 138 copies/mL. A negative result does not preclude SARS-Cov-2 infection and should not be used as the sole basis for treatment or other patient management decisions. A negative result may occur with  improper specimen collection/handling, submission of specimen other than nasopharyngeal swab, presence of viral mutation(s) within the areas targeted by this assay, and inadequate number of viral copies(<138 copies/mL). A negative result must be combined with clinical observations, patient history, and epidemiological information. The expected result is Negative.  Fact Sheet for Patients:  EntrepreneurPulse.com.au  Fact Sheet for Healthcare Providers:  IncredibleEmployment.be  This test is no t yet approved or cleared by the [place] FDA and  has been authorized for detection and/or diagnosis of SARS-CoV-2 by FDA under an Emergency Use Authorization (EUA). This EUA will remain  in effect (meaning this test can be used) for the duration of the COVID-19 declaration under Section 564(b)(1) of the Act, 21 U.S.C.section 360bbb-3(b)(1), unless the authorization is terminated  or revoked sooner.       Influenza A by PCR NEGATIVE NEGATIVE Final   Influenza B by PCR NEGATIVE NEGATIVE Final    Comment: (NOTE) The Xpert Xpress SARS-CoV-2/FLU/RSV plus assay is intended as an aid in the diagnosis of influenza from Nasopharyngeal swab specimens and should not be used as a sole basis for treatment. Nasal washings and aspirates are unacceptable for Xpert Xpress SARS-CoV-2/FLU/RSV testing.  Fact Sheet for Patients: EntrepreneurPulse.com.au  Fact Sheet for Healthcare  Providers: IncredibleEmployment.be  This test is not yet approved or cleared by the [place] FDA and has been authorized for detection and/or diagnosis of SARS-CoV-2 by FDA under an Emergency Use Authorization (EUA). This EUA will remain in effect (meaning this test can be used) for the duration of the COVID-19 declaration under Section 564(b)(1) of the Act, 21 U.S.C. section 360bbb-3(b)(1), unless the authorization is terminated or revoked.  Performed at [facility], [address], [place], [place] [address]   Urine Culture     Status: None   Collection Time: 02/07/22 11:32 PM   Specimen: In/Out Cath Urine  Result Value  Ref Range Status   Specimen Description IN/OUT CATH URINE  Final   Special Requests NONE  Final   Culture   Final    NO GROWTH Performed at [facility], [address], [place], [place] [address]    Report Status 02/08/2022 FINAL  Final  Culture, blood (Routine X 2) w Reflex to ID Panel     Status: None (Preliminary result)   Collection Time: 02/09/22  4:32 AM   Specimen: BLOOD RIGHT HAND  Result Value Ref Range Status   Specimen Description BLOOD RIGHT HAND  Final   Special Requests   Final    AEROBIC BOTTLE ONLY Blood Culture results may not be optimal due to an excessive volume of blood received in culture bottles   Culture   Final    NO GROWTH 2 DAYS Performed at [facility], [place] [address], [place], [place] [address]    Report Status PENDING  Incomplete  Culture, blood (Routine X 2) w Reflex to ID Panel     Status: None (Preliminary result)   Collection Time: 02/09/22  4:32 AM   Specimen: BLOOD LEFT HAND  Result Value Ref Range Status   Specimen Description BLOOD LEFT HAND  Final   Special Requests   Final    AEROBIC BOTTLE ONLY Blood Culture results may not be optimal due to an excessive volume of blood received in culture bottles   Culture   Final    NO GROWTH 2 DAYS Performed at [facility], [place] [address], [place], [place] [address]    Report Status PENDING  Incomplete         Radiology Studies: No results found.      Scheduled Meds:  bictegravir-emtricitabine-tenofovir AF  1 tablet Oral Daily   enoxaparin (LOVENOX) injection  40 mg Subcutaneous Q24H   sodium chloride flush  3 mL Intravenous Q12H   sulfamethoxazole-trimethoprim  1 tablet Oral Once per day on Mon Wed Fri   Continuous Infusions:  vancomycin 1,250 mg (02/11/22 0152)     LOS: 4 days    Time spent: 35 minutes    Shiane Wenberg A Costella Schwarz, MD [facility]   If 7PM-7AM, please contact night-coverage [email or web address]  02/11/2022, 7:04 AM

## 2022-02-11 NOTE — Progress Notes (Signed)
Patient with increased RR when awake in room. Low grade fever and difficulty swallowing. Attending MD notified   02/11/22 1629  Assess: MEWS Score  Temp 99.1 F (37.3 C)  BP 128/60  Resp (!) 28  Level of Consciousness Alert  SpO2 92 %  O2 Device Nasal Cannula  O2 Flow Rate (L/min) 3 L/min  Assess: MEWS Score  MEWS Temp 0  MEWS Systolic 0  MEWS Pulse 2  MEWS RR 2  MEWS LOC 0  MEWS Score 4  MEWS Score Color Red  Assess: if the MEWS score is Yellow or Red  Were vital signs taken at a resting state? Yes  Focused Assessment No change from prior assessment  Does the patient meet 2 or more of the SIRS criteria? Yes  Does the patient have a confirmed or suspected source of infection? Yes  Provider and Rapid Response Notified? Yes  MEWS guidelines implemented *See Row Information* Yes  Treat  Pain Scale 0-10  Pain Score 8  Take Vital Signs  Increase Vital Sign Frequency  Red: Q 1hr X 4 then Q 4hr X 4, if remains red, continue Q 4hrs  Escalate  MEWS: Escalate Red: discuss with charge nurse/RN and provider, consider discussing with RRT  Notify: Charge Nurse/RN  Name of Charge Nurse/RN Notified Lamelva  Date Charge Nurse/RN Notified 02/11/22  Time Charge Nurse/RN Notified 1631  Notify: Provider  Provider Name/Title Dr. Erasmo Score  Date Provider Notified 02/11/22  Time Provider Notified [phone]  Document  Patient Outcome Other (Comment)  Progress note created (see row info) Yes  Assess: SIRS CRITERIA  SIRS Temperature  0  SIRS Pulse 1  SIRS Respirations  1  SIRS WBC 1  SIRS Score Sum  3

## 2022-02-11 NOTE — Progress Notes (Signed)
Patient requesting pain medication. When in the room to assess level of pain patient is asleep when I first arrived. Opens eyes to voice but quickly fell back asleep. Patient respirations even and slow and oxygen saturations maintaining at 94% on 4L. IV fluids started but pain medication not administered at this time. Will assess if patient calls out again.

## 2022-02-12 ENCOUNTER — Inpatient Hospital Stay (HOSPITAL_COMMUNITY): Payer: Self-pay

## 2022-02-12 LAB — CBC
HCT: 29.8 % — ABNORMAL LOW (ref 39.0–52.0)
Hemoglobin: 10.2 g/dL — ABNORMAL LOW (ref 13.0–17.0)
MCH: 27.5 pg (ref 26.0–34.0)
MCHC: 34.2 g/dL (ref 30.0–36.0)
MCV: 80.3 fL (ref 80.0–100.0)
Platelets: 238 10*3/uL (ref 150–400)
RBC: 3.71 MIL/uL — ABNORMAL LOW (ref 4.22–5.81)
RDW: 16.6 % — ABNORMAL HIGH (ref 11.5–15.5)
WBC: 9.6 10*3/uL (ref 4.0–10.5)
nRBC: 0.2 % (ref 0.0–0.2)

## 2022-02-12 LAB — BASIC METABOLIC PANEL
Anion gap: 9 (ref 5–15)
BUN: 6 mg/dL (ref 6–20)
CO2: 28 mmol/L (ref 22–32)
Calcium: 7.4 mg/dL — ABNORMAL LOW (ref 8.9–10.3)
Chloride: 94 mmol/L — ABNORMAL LOW (ref 98–111)
Creatinine, Ser: 0.65 mg/dL (ref 0.61–1.24)
GFR, Estimated: >60 mL/min (ref >60)
Glucose, Bld: 107 mg/dL — ABNORMAL HIGH (ref 70–99)
Potassium: 3.5 mmol/L (ref 3.5–5.1)
Sodium: 131 mmol/L — ABNORMAL LOW (ref 135–145)

## 2022-02-12 LAB — LACTATE DEHYDROGENASE: LDH: 169 U/L (ref 98–192)

## 2022-02-12 LAB — AMYLASE, PLEURAL OR PERITONEAL FLUID: Amylase, Fluid: 30 U/L

## 2022-02-12 LAB — BRAIN NATRIURETIC PEPTIDE: B Natriuretic Peptide: 30.8 pg/mL (ref 0.0–100.0)

## 2022-02-12 MED ORDER — POTASSIUM CHLORIDE CRYS ER 20 MEQ PO TBCR
40.0000 meq | EXTENDED_RELEASE_TABLET | Freq: Once | ORAL | Status: AC
  Administered 2022-02-12: 40 meq via ORAL
  Filled 2022-02-12: qty 2

## 2022-02-12 MED ORDER — ALBUTEROL SULFATE (2.5 MG/3ML) 0.083% IN NEBU: 2.5000 mg | INHALATION_SOLUTION | Freq: Four times a day (QID) | RESPIRATORY_TRACT | Status: DC | PRN

## 2022-02-12 MED ORDER — IPRATROPIUM-ALBUTEROL 0.5-2.5 (3) MG/3ML IN SOLN
3.0000 mL | Freq: Three times a day (TID) | RESPIRATORY_TRACT | Status: DC
  Administered 2022-02-13: 3 mL via RESPIRATORY_TRACT
  Filled 2022-02-12: qty 3

## 2022-02-12 MED ORDER — OXYCODONE HCL 5 MG PO TABS
5.0000 mg | ORAL_TABLET | ORAL | Status: DC | PRN
  Administered 2022-02-12 – 2022-02-13 (×2): 5 mg via ORAL
  Administered 2022-02-13 – 2022-02-20 (×22): 10 mg via ORAL
  Filled 2022-02-12 (×4): qty 2
  Filled 2022-02-12: qty 1
  Filled 2022-02-12 (×3): qty 2
  Filled 2022-02-12: qty 1
  Filled 2022-02-12 (×16): qty 2

## 2022-02-12 NOTE — Progress Notes (Signed)
   NAME:  Rick Lam, MRN:  [id number], DOB:  Apr 15, 1991, LOS: 5 ADMISSION DATE:  02/07/2022, CONSULTATION DATE:  02/07/2022 REFERRING MD:  Roderic Palau, CHIEF COMPLAINT:  fever, chest discomfort, weakness   History of Present Illness:  Rick Lam is a 31 year old male with HIV/AIDS on biktarvy, chronic hepatitis C, latent syphillis s/p treatment who presented with painful left axillary nodules with erythema, found to have sepsis with MRSA bacteremia along with axillary abscess, cellulitis and septic pulmonary emboli.   TEE 8/18 negative for vegetations. ID is following and patient has been on vancomycin.   PCCM consulted due to increasing respiratory distress and new right pleural effusion.  Pertinent Medical History:  HIV/AIDS [place] Late latent syphilis, treated Chronic HCV  Significant Hospital Events: Including procedures, antibiotic start and stop dates in addition to other pertinent events   02/06/22 transfer from [facility] ED to ED, started on vanc/cefepime 8/15 - CT Chest with innumerable pulmonary nodules bilaterally, several cavitated; asymmetrical skin thickening/SQ edema in L axilla c/f cellulitis. PCCM consulted. 8/16 - TTE with EF 65-70%, LE dopplers negative for DVT. MRI L humerus with severe upper arm cellulitis, two small 1.5cm abscesses. ID consulted 8/17 - Ortho consult for cellulitis, conservative management with IV abx. BCx positive for MRSA, vanc-sensitive. 8/18 - TEE with Cards, r/o vegetation. Persistent pain. Cellulitis/erythema improving.  Interim History / Subjective:   Mom at bedside Patient complaining of chest pain and shortness of breath  Objective:  Blood pressure 126/64, pulse (!) 111, temperature 98.8 F (37.1 C), temperature source Oral, resp. rate (!) 26, height 6' (1.829 m), weight 76 kg, SpO2 93 %.        Intake/Output Summary (Last 24 hours) at 02/12/2022 1529 Last data filed at 02/12/2022 1400 Gross per 24 hour  Intake 2111.19 ml  Output 1075 ml   Net 1036.19 ml   Filed Weights   02/09/22 0000 02/11/22 0311 02/12/22 0424  Weight: 79.5 kg 75 kg 76 kg   Physical Examination: General: Acutely ill-appearing young man, mild distress HEENT: [place]/AT, anicteric sclera, PERRL, moist mucous membranes. Neuro:  Drowsy, wakes easily to voice.  Responds to verbal stimuli. Following commands consistently. Moves all 4 extremities spontaneously.  CV: RRR, no m/g/r. PULM: diminished breath sounds GI: Soft, nontender, nondistended. Normoactive bowel sounds. Extremities: bilateral lower extremity edema Skin: Warm/dry, erythema of LUE/axilla  Assessment & Plan:  Sepsis due to MRSA Bacteremia, septic pulmonary emboli and left arm abscess/cellulitis Acute Hypoxemic Respiratory Failure Right Pleural Effusion HIV/AIDS  Plan: - Right pleural effusion in setting of septic emboli and MRSA bacteremia is concerning for empyema. Will plan for pigtail chest tube placement at bedside today. - If confirmed empyema, will likely need pleural lytic therapy - On vancomycin per ID  Freda Jackson, MD [facility] Office: [phone]   See Amion for personal pager PCCM on call pager [phone] until 7pm. Please call Elink 7p-7a. [phone]

## 2022-02-12 NOTE — Progress Notes (Signed)
PROGRESS NOTE    Rick Lam  VHQ:[id number] DOB: Sep 16, 1990 DOA: 02/07/2022 PCP: Jabier Mutton, MD   Brief Narrative: 31 year old with past medical history significant for HIV/AIDS, recent CD4 78, chronic hepatitis C, latent syphilis that has been treated previously.  Presented with painful left axillary nodules with surrounding erythema he was admitted with sepsis, found to have MRSA bacteremia secondary to axillary abscess and cellulitis.  He underwent TEE 8/18 which was negative for endocarditis.  ID is following.   Assessment & Plan:   Principal Problem:   Severe sepsis ([place]) Active Problems:   AIDS (acquired immune deficiency syndrome) (HCC)   Hyponatremia   Acute respiratory failure with hypoxia (HCC)   Acute septic pulmonary embolism without acute cor pulmonale (HCC)   Cutaneous abscess of left upper extremity   1-Sepsis secondary to MRSA bacteremia,  bilateral likely pulmonary septic emboli, Left arm cellulitis and abscess:  -Patient immunosuppressed, in the setting of HIV/AIDS. -Blood cultures from 8/15 positive for MRSA. -Repeated blood culture 8/17 no growth to date. Blood culture 8/19: No growth to date.  -MRI left upper extremity with a small abscess x2 evaluated by Ortho, did not recommend drainage at that time. -TEE 8/18 negative for endocarditis -Pulmonology recommended repeat CT chest at the end of treatment for MRSA if lesions persist consider outpatient bronchoscopy. -Continue with IV vancomycin -Surgery consulted for further evaluation of axillary abscess on 8/19. Surgery recommend warm compress, IV antibiotics, no need for drainage.   Acute hypoxic respiratory failure: Bilateral pulmonary nodules, concern with septic emboli Continue with oxygen supplementation, increase to 4L  Incentive spirometry/ Flutter Valve Started  guaifenesin and nebulizers RR increase today to 30--40. He report SOB. BL wheezing.  Plan to get BNP, Repeat chest x ray. Pulmonologist  consulted.  Speech swallow.   HIV/AIDS: Continue with Biktarvy and Bactrim ID following, appreciate assistance  Hyponatremia: Could be combination of dehydration and some component of SIADH Sodium improved with hydration.  AKI: Resolved Oral thrush; odynophagia; started IV diflucan.   Estimated body mass index is 22.72 kg/m as calculated from the following:   Height as of this encounter: 6' (1.829 m).   Weight as of this encounter: 76 kg.   DVT prophylaxis: Lovenox Code Status: Full code Family Communication: Care discussed with patient Disposition Plan:  Status is: Inpatient Remains inpatient appropriate because: Continue with management of MRSA bacteremia, axillary abscess, bilateral pulmonary septic emboli.     Consultants:  ID Surgery   Procedures:  TEE 8/18 negative for endocarditis.   Antimicrobials:  Vancomycin   Subjective: He is more alert, he has  tachypnea  He report worsening dyspnea.  Report chest pain.  Report sore throat, oral thrush. Cough after eating.   Objective: Vitals:   02/11/22 1951 02/11/22 2008 02/11/22 2351 02/12/22 0424  BP:  134/73 126/64 111/60  Pulse:  (!) 122 (!) 119 (!) 122  Resp:  '20 16 19  '$ Temp:  98.7 F (37.1 C) 98.7 F (37.1 C) 98.6 F (37 C)  TempSrc:  Oral Oral Oral  SpO2: 94% 93% 96% 94%  Weight:    76 kg  Height:        Intake/Output Summary (Last 24 hours) at 02/12/2022 0700 Last data filed at 02/12/2022 0600 Gross per 24 hour  Intake 2705.73 ml  Output 800 ml  Net 1905.73 ml    Filed Weights   02/09/22 0000 02/11/22 0311 02/12/22 0424  Weight: 79.5 kg 75 kg 76 kg    Examination:  General exam: mild distress, alert Respiratory system: BL ronchus and wheezing Cardiovascular system: S 1, S 2 RRR Gastrointestinal system: BS present, soft, nt Central nervous system: alert and oriented.  Extremities: no edema    Data Reviewed: I have personally reviewed following labs and imaging  studies  CBC: Recent Labs  Lab 02/07/22 1701 02/08/22 0410 02/09/22 0432 02/10/22 0746 02/11/22 0341 02/12/22 0449  WBC 12.2* 7.6 8.6 10.6* 12.1* 9.6  NEUTROABS 11.2* 6.5 6.8 8.4* 8.7*  --   HGB 14.2 11.4* 11.0* 10.8* 11.5* 10.2*  HCT 43.7 33.9* 32.3* 32.8* 34.3* 29.8*  MCV 84.5 82.9 80.5 83.0 81.9 80.3  PLT 156 104* 134* 148* 185 347    Basic Metabolic Panel: Recent Labs  Lab 02/08/22 0410 02/09/22 0432 02/10/22 0746 02/11/22 0341 02/12/22 0449  NA 130* 128* 129* 131* 131*  K 3.7 3.7 3.6 3.6 3.5  CL 102 97* 95* 92* 94*  CO2 21* '24 26 28 28  '$ GLUCOSE 92 102* 158* 108* 107*  BUN '14 9 6 '$ 5* 6  CREATININE 1.06 0.80 0.65 0.57* 0.65  CALCIUM 7.2* 7.7* 7.7* 7.8* 7.4*  MG  --   --   --  1.9  --     GFR: Estimated Creatinine Clearance: 141.2 mL/min (by C-G formula based on SCr of 0.65 mg/dL). Liver Function Tests: Recent Labs  Lab 02/07/22 1701 02/08/22 0410 02/09/22 0432 02/10/22 0746  AST 55* 37 28 18  ALT 44 '30 27 20  '$ ALKPHOS 82 59 63 61  BILITOT 0.9 0.7 0.8 0.6  PROT 8.0 5.4* 5.6* 5.4*  ALBUMIN 3.3* 2.1* 2.1* 2.0*    No results for input(s): "LIPASE", "AMYLASE" in the last 168 hours. No results for input(s): "AMMONIA" in the last 168 hours. Coagulation Profile: Recent Labs  Lab 02/07/22 1821  INR 1.3*    Cardiac Enzymes: No results for input(s): "CKTOTAL", "CKMB", "CKMBINDEX", "TROPONINI" in the last 168 hours. BNP (last 3 results) No results for input(s): "PROBNP" in the last 8760 hours. HbA1C: No results for input(s): "HGBA1C" in the last 72 hours. CBG: Recent Labs  Lab 02/11/22 1756  GLUCAP 158*   Lipid Profile: No results for input(s): "CHOL", "HDL", "LDLCALC", "TRIG", "CHOLHDL", "LDLDIRECT" in the last 72 hours. Thyroid Function Tests: Recent Labs    02/10/22 0746  TSH 0.401    Anemia Panel: No results for input(s): "VITAMINB12", "FOLATE", "FERRITIN", "TIBC", "IRON", "RETICCTPCT" in the last 72 hours. Sepsis Labs: Recent Labs   Lab 02/07/22 1701 02/07/22 1901  LATICACIDVEN 4.1* 1.3     Recent Results (from the past 240 hour(s))  Blood Culture (routine x 2)     Status: Abnormal   Collection Time: 02/07/22  5:01 PM   Specimen: BLOOD RIGHT HAND  Result Value Ref Range Status   Specimen Description   Final    BLOOD RIGHT HAND Performed at [facility], [address], [place], [place] [address]    Special Requests   Final    BOTTLES DRAWN AEROBIC AND ANAEROBIC Blood Culture results may not be optimal due to an inadequate volume of blood received in culture bottles Performed at [facility], [address], [place], [place] [address]    Culture  Setup Time   Final    GRAM POSITIVE COCCI BOTTLES DRAWN AEROBIC AND ANAEROBIC Gram Stain Report Called to,Read Back By and Verified With: GUILBEAULT,D '@0745'$  BY MATTHEWS, B 8.16.2023 IN BOTH AEROBIC AND ANAEROBIC BOTTLES    Culture (A)  Final    STAPHYLOCOCCUS AUREUS SUSCEPTIBILITIES  PERFORMED ON PREVIOUS CULTURE WITHIN THE LAST 5 DAYS. Performed at [facility], [place] [address] [place], [place] [address]    Report Status 02/10/2022 FINAL  Final  Blood Culture (routine x 2)     Status: Abnormal   Collection Time: 02/07/22  5:01 PM   Specimen: BLOOD RIGHT ARM  Result Value Ref Range Status   Specimen Description   Final    BLOOD RIGHT ARM Performed at [facility], [address], [place], [place] [address]    Special Requests   Final    BOTTLES DRAWN AEROBIC AND ANAEROBIC Blood Culture results may not be optimal due to an inadequate volume of blood received in culture bottles Performed at [place] [place], [place] [address]    Culture  Setup Time   Final    GRAM POSITIVE COCCI BOTTLES DRAWN AEROBIC AND ANAEROBIC Gram Stain Report Called to,Read Back By and Verified With: GUILBEAULT,D'@0745'$  BY MATTHEWS, B 8.16.2023 IN BOTH AEROBIC AND ANAEROBIC BOTTLES GRAM STAIN REVIEWED-AGREE WITH RESULT CRITICAL RESULT CALLED TO, READ BACK BY AND  VERIFIED WITH: Noemi Chapel [id number] AT 1411 BY EC Performed at [facility], [place] [address], [place], [place] [address]    Culture METHICILLIN RESISTANT STAPHYLOCOCCUS AUREUS (A)  Final   Report Status 02/10/2022 FINAL  Final   Organism ID, Bacteria METHICILLIN RESISTANT STAPHYLOCOCCUS AUREUS  Final      Susceptibility   Methicillin resistant staphylococcus aureus - MIC*    CIPROFLOXACIN >=8 RESISTANT Resistant     ERYTHROMYCIN >=8 RESISTANT Resistant     GENTAMICIN <=0.5 SENSITIVE Sensitive     OXACILLIN >=4 RESISTANT Resistant     TETRACYCLINE >=16 RESISTANT Resistant     VANCOMYCIN <=0.5 SENSITIVE Sensitive     TRIMETH/SULFA >=320 RESISTANT Resistant     CLINDAMYCIN <=0.25 SENSITIVE Sensitive     RIFAMPIN <=0.5 SENSITIVE Sensitive     Inducible Clindamycin NEGATIVE Sensitive     * METHICILLIN RESISTANT STAPHYLOCOCCUS AUREUS  Blood Culture ID Panel (Reflexed)     Status: Abnormal   Collection Time: 02/07/22  5:01 PM  Result Value Ref Range Status   Enterococcus faecalis NOT DETECTED NOT DETECTED Final   Enterococcus Faecium NOT DETECTED NOT DETECTED Final   Listeria monocytogenes NOT DETECTED NOT DETECTED Final   Staphylococcus species DETECTED (A) NOT DETECTED Final    Comment: CRITICAL RESULT CALLED TO, READ BACK BY AND VERIFIED WITH: PHARMD Alycia Rossetti [phone] AT 1411 BY EC    Staphylococcus aureus (BCID) DETECTED (A) NOT DETECTED Final    Comment: Methicillin (oxacillin)-resistant Staphylococcus aureus (MRSA). MRSA is predictably resistant to beta-lactam antibiotics (except ceftaroline). Preferred therapy is vancomycin unless clinically contraindicated. Patient requires contact precautions if  hospitalized. CRITICAL RESULT CALLED TO, READ BACK BY AND VERIFIED WITH: [place] [id number] AT 1975 BY EC    Staphylococcus epidermidis NOT DETECTED NOT DETECTED Final   Staphylococcus lugdunensis NOT DETECTED NOT DETECTED Final   Streptococcus  species NOT DETECTED NOT DETECTED Final   Streptococcus agalactiae NOT DETECTED NOT DETECTED Final   Streptococcus pneumoniae NOT DETECTED NOT DETECTED Final   Streptococcus pyogenes NOT DETECTED NOT DETECTED Final   A.calcoaceticus-baumannii NOT DETECTED NOT DETECTED Final   Bacteroides fragilis NOT DETECTED NOT DETECTED Final   Enterobacterales NOT DETECTED NOT DETECTED Final   Enterobacter cloacae complex NOT DETECTED NOT DETECTED Final   Escherichia coli NOT DETECTED NOT DETECTED Final   Klebsiella aerogenes NOT DETECTED NOT DETECTED Final   Klebsiella  oxytoca NOT DETECTED NOT DETECTED Final   Klebsiella pneumoniae NOT DETECTED NOT DETECTED Final   Proteus species NOT DETECTED NOT DETECTED Final   Salmonella species NOT DETECTED NOT DETECTED Final   Serratia marcescens NOT DETECTED NOT DETECTED Final   Haemophilus influenzae NOT DETECTED NOT DETECTED Final   Neisseria meningitidis NOT DETECTED NOT DETECTED Final   Pseudomonas aeruginosa NOT DETECTED NOT DETECTED Final   Stenotrophomonas maltophilia NOT DETECTED NOT DETECTED Final   Candida albicans NOT DETECTED NOT DETECTED Final   Candida auris NOT DETECTED NOT DETECTED Final   Candida glabrata NOT DETECTED NOT DETECTED Final   Candida krusei NOT DETECTED NOT DETECTED Final   Candida parapsilosis NOT DETECTED NOT DETECTED Final   Candida tropicalis NOT DETECTED NOT DETECTED Final   Cryptococcus neoformans/gattii NOT DETECTED NOT DETECTED Final   Meth resistant mecA/C and MREJ DETECTED (A) NOT DETECTED Final    Comment: CRITICAL RESULT CALLED TO, READ BACK BY AND VERIFIED WITH: Noemi Chapel [phone] AT 1411 BY EC Performed at [facility], [place] [address], [place], [place] [address]   Resp Panel by RT-PCR (Flu A&B, Covid) Anterior Nasal Swab     Status: None   Collection Time: 02/07/22  5:20 PM   Specimen: Anterior Nasal Swab  Result Value Ref Range Status   SARS Coronavirus 2 by RT PCR NEGATIVE NEGATIVE Final     Comment: (NOTE) SARS-CoV-2 target nucleic acids are NOT DETECTED.  The SARS-CoV-2 RNA is generally detectable in upper respiratory specimens during the acute phase of infection. The lowest concentration of SARS-CoV-2 viral copies this assay can detect is 138 copies/mL. A negative result does not preclude SARS-Cov-2 infection and should not be used as the sole basis for treatment or other patient management decisions. A negative result may occur with  improper specimen collection/handling, submission of specimen other than nasopharyngeal swab, presence of viral mutation(s) within the areas targeted by this assay, and inadequate number of viral copies(<138 copies/mL). A negative result must be combined with clinical observations, patient history, and epidemiological information. The expected result is Negative.  Fact Sheet for Patients:  EntrepreneurPulse.com.au  Fact Sheet for Healthcare Providers:  IncredibleEmployment.be  This test is no t yet approved or cleared by the [place] FDA and  has been authorized for detection and/or diagnosis of SARS-CoV-2 by FDA under an Emergency Use Authorization (EUA). This EUA will remain  in effect (meaning this test can be used) for the duration of the COVID-19 declaration under Section 564(b)(1) of the Act, 21 U.S.C.section 360bbb-3(b)(1), unless the authorization is terminated  or revoked sooner.       Influenza A by PCR NEGATIVE NEGATIVE Final   Influenza B by PCR NEGATIVE NEGATIVE Final    Comment: (NOTE) The Xpert Xpress SARS-CoV-2/FLU/RSV plus assay is intended as an aid in the diagnosis of influenza from Nasopharyngeal swab specimens and should not be used as a sole basis for treatment. Nasal washings and aspirates are unacceptable for Xpert Xpress SARS-CoV-2/FLU/RSV testing.  Fact Sheet for Patients: EntrepreneurPulse.com.au  Fact Sheet for Healthcare  Providers: IncredibleEmployment.be  This test is not yet approved or cleared by the [place] FDA and has been authorized for detection and/or diagnosis of SARS-CoV-2 by FDA under an Emergency Use Authorization (EUA). This EUA will remain in effect (meaning this test can be used) for the duration of the COVID-19 declaration under Section 564(b)(1) of the Act, 21 U.S.C. section 360bbb-3(b)(1), unless the authorization is terminated or revoked.  Performed at  [facility], [address], [place], [place] [address]   Urine Culture     Status: None   Collection Time: 02/07/22 11:32 PM   Specimen: In/Out Cath Urine  Result Value Ref Range Status   Specimen Description IN/OUT CATH URINE  Final   Special Requests NONE  Final   Culture   Final    NO GROWTH Performed at [facility], [place] [address], [place], [place] [address]    Report Status 02/08/2022 FINAL  Final  Culture, blood (Routine X 2) w Reflex to ID Panel     Status: None (Preliminary result)   Collection Time: 02/09/22  4:32 AM   Specimen: BLOOD RIGHT HAND  Result Value Ref Range Status   Specimen Description BLOOD RIGHT HAND  Final   Special Requests   Final    AEROBIC BOTTLE ONLY Blood Culture results may not be optimal due to an excessive volume of blood received in culture bottles   Culture   Final    NO GROWTH 3 DAYS Performed at [facility], [place] [address], [place], [place] [address]    Report Status PENDING  Incomplete  Culture, blood (Routine X 2) w Reflex to ID Panel     Status: None (Preliminary result)   Collection Time: 02/09/22  4:32 AM   Specimen: BLOOD LEFT HAND  Result Value Ref Range Status   Specimen Description BLOOD LEFT HAND  Final   Special Requests   Final    AEROBIC BOTTLE ONLY Blood Culture results may not be optimal due to an excessive volume of blood received in culture bottles   Culture   Final    NO GROWTH 3 DAYS Performed at [facility], [place] [address] [place], [place] [address]    Report Status PENDING  Incomplete  Culture, blood (Routine X 2) w Reflex to ID Panel     Status: None (Preliminary result)   Collection Time: 02/11/22  3:49 AM   Specimen: BLOOD LEFT HAND  Result Value Ref Range Status   Specimen Description BLOOD LEFT HAND  Final   Special Requests   Final    BOTTLES DRAWN AEROBIC ONLY Blood Culture results may not be optimal due to an inadequate volume of blood received in culture bottles   Culture   Final    NO GROWTH 1 DAY Performed at [facility], [place] [address], [place], [place] [address]    Report Status PENDING  Incomplete  Culture, blood (Routine X 2) w Reflex to ID Panel     Status: None (Preliminary result)   Collection Time: 02/11/22  3:57 AM   Specimen: BLOOD RIGHT HAND  Result Value Ref Range Status   Specimen Description BLOOD RIGHT HAND  Final   Special Requests   Final    BOTTLES DRAWN AEROBIC ONLY Blood Culture results may not be optimal due to an inadequate volume of blood received in culture bottles   Culture   Final    NO GROWTH 1 DAY Performed at [facility], [place] [address], [place], [place] [address]    Report Status PENDING  Incomplete         Radiology Studies: ECHO TEE  Result Date: 02/11/2022    TRANSESOPHOGEAL ECHO REPORT   Patient Name:   Rick Lam Date of Exam: 02/10/2022 Medical Rec #:  [id number]        Height:       72.0 in Accession #:  [id number]       Weight:       175.2 lb Date of Birth:  08/12/1990        BSA:          2.014 m Patient Age:    31 years         BP:           119/70 mmHg Patient Gender: M                HR:           100 bpm. Exam Location:  Inpatient Procedure: Transesophageal Echo Indications:     Bacteremia  History:         Patient has prior history of Echocardiogram examinations, most                  recent 02/08/2022. AIDS.  Sonographer:     Johny Chess RDCS Referring Phys:  [id number] Margie Billet Diagnosing Phys: Skeet Latch MD PROCEDURE: After discussion of the risks and benefits of a TEE, an informed consent was obtained from the patient. The transesophogeal probe was passed without difficulty through the esophogus of the patient. Imaged were obtained with the patient in a left lateral decubitus position. Sedation performed by different physician. The patient was monitored while under deep sedation. Anesthestetic sedation was provided intravenously by Anesthesiology: '203mg'$  of Propofol, '20mg'$  of Lidocaine. The patient's vital signs; including heart rate, blood pressure, and oxygen saturation; remained stable throughout the procedure. The patient developed no complications during the procedure. IMPRESSIONS  1. Left ventricular ejection fraction, by estimation, is 60 to 65%. The left ventricle has normal function. The left ventricle has no regional wall motion abnormalities.  2. Right ventricular systolic function is normal. The right ventricular size is normal.  3. No left atrial/left atrial appendage thrombus was detected.  4. The mitral valve is normal in structure. Trivial mitral valve regurgitation. No evidence of mitral stenosis.  5. The aortic valve is tricuspid. Aortic valve regurgitation is not visualized. No aortic stenosis is present.  6. The inferior vena cava is normal in size with greater than 50% respiratory variability, suggesting right atrial pressure of 3 mmHg. Conclusion(s)/Recommendation(s): Normal biventricular function without evidence of hemodynamically significant valvular heart disease. FINDINGS  Left Ventricle: Left ventricular ejection fraction, by estimation, is 60 to 65%. The left ventricle has normal function. The left ventricle has no regional wall motion abnormalities. The left ventricular internal cavity size was normal in size. There is  no left ventricular hypertrophy. Right Ventricle: The right ventricular size is normal. No increase in right ventricular wall thickness. Right ventricular  systolic function is normal. Left Atrium: Left atrial size was normal in size. No left atrial/left atrial appendage thrombus was detected. Right Atrium: Right atrial size was normal in size. Pericardium: There is no evidence of pericardial effusion. Mitral Valve: The mitral valve is normal in structure. Trivial mitral valve regurgitation. No evidence of mitral valve stenosis. Tricuspid Valve: The tricuspid valve is normal in structure. Tricuspid valve regurgitation is trivial. No evidence of tricuspid stenosis. Aortic Valve: The aortic valve is tricuspid. Aortic valve regurgitation is not visualized. No aortic stenosis is present. Pulmonic Valve: The pulmonic valve was normal in structure. Pulmonic valve regurgitation is trivial. No evidence of pulmonic stenosis. Aorta: The aortic root is normal in size and structure. Venous: The inferior vena cava is normal in size with greater than 50% respiratory variability, suggesting right atrial pressure of 3 mmHg. IAS/Shunts:  No atrial level shunt detected by color flow Doppler. Skeet Latch MD Electronically signed by Skeet Latch MD Signature Date/Time: 02/11/2022/12:04:23 PM    Final         Scheduled Meds:  bictegravir-emtricitabine-tenofovir AF  1 tablet Oral Daily   enoxaparin (LOVENOX) injection  40 mg Subcutaneous Q24H   guaiFENesin  600 mg Oral BID   ipratropium-albuterol  3 mL Nebulization Q6H   potassium chloride  40 mEq Oral Once   sodium chloride flush  3 mL Intravenous Q12H   sulfamethoxazole-trimethoprim  1 tablet Oral Once per day on Mon Wed Fri   Continuous Infusions:  sodium chloride 75 mL/hr at 02/12/22 0630   fluconazole (DIFLUCAN) IV 100 mg (02/11/22 1803)   vancomycin 1,250 mg (02/12/22 0120)     LOS: 5 days    Time spent: 35 minutes    Ima Hafner A Concetta [place], MD [facility]   If 7PM-7AM, please contact night-coverage [email or web address]  02/12/2022, 7:00 AM

## 2022-02-12 NOTE — Procedures (Signed)
Insertion of Chest Tube Procedure Note  Rick Lam  [id number]  09-06-1990  Date:02/12/22  Time:6:18 PM    Provider Performing: Freddi Starr   Procedure: Chest Tube Insertion [phone])  Indication(s) Effusion  Consent Risks of the procedure as well as the alternatives and risks of each were explained to the patient and/or caregiver.  Consent for the procedure was obtained and is signed in the bedside chart  Anesthesia Topical only with 1% lidocaine    Time Out Verified patient identification, verified procedure, site/side was marked, verified correct patient position, special equipment/implants available, medications/allergies/relevant history reviewed, required imaging and test results available.   Sterile Technique Maximal sterile technique including full sterile barrier drape, hand hygiene, sterile gown, sterile gloves, mask, hair covering, sterile ultrasound probe cover (if used).   Procedure Description Ultrasound used to identify appropriate pleural anatomy for placement and overlying skin marked. Area of placement cleaned and draped in sterile fashion.  A 14 French pigtail pleural catheter was placed into the right pleural space using Seldinger technique. Appropriate return of fluid was obtained.  The tube was connected to atrium and placed on -20 cm H2O wall suction.   Complications/Tolerance None; patient tolerated the procedure well. Chest X-ray is ordered to verify placement.   EBL Minimal  Specimen(s) fluid

## 2022-02-12 NOTE — Evaluation (Signed)
Clinical/Bedside Swallow Evaluation Patient Details  Name: Rick Lam MRN: [id number] Date of Birth: Apr 26, 1991  Today's Date: 02/12/2022 Time: SLP Start Time (ACUTE ONLY): 3299 SLP Stop Time (ACUTE ONLY): 1217 SLP Time Calculation (min) (ACUTE ONLY): 16 min  Past Medical History:  Past Medical History:  Diagnosis Date   AIDS (acquired immune deficiency syndrome) ([place]) 06/01/2021   dx'ed 2010. Doesn't recall cd4 nadir. Sexual transmission. 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 No fever, chill, nightsweat. Never been on bactrim   Syphilis 06/01/2021   Remember getting 1 IM shot 04/2021; 02/2021 rpr 128. Doesn't recall rash before that. No concerning sx now via ROS as of 06/01/2021     Thrush 06/01/2021   Has had what he thinks is thrush for 3 months prior to 05/2021 visit. Lots of problem with pain when swallowing and also white plaque on tongue and throat  Also has hypertrophic nodule along gum lines  Started getting medication with spit and squish x3 separate times no effect. Received them at emergency room   Past Surgical History: History reviewed. No pertinent surgical history. HPI:  Rick Lam is a pleasant 31 y.o. male with medical history significant for HIV/AIDS, chronic hepatitis C, latent syphilis that has been treated, and recent left axillary nodules with surrounding pain, swelling, and erythema  for which she was started on Bactrim yesterday, now presenting to the emergency department with worsening pain, redness, and swelling around the left axilla, new pleuritic chest pain, and fevers.     Patient reports that he was feeling well on 02/05/2022 when he noticed a mild rash involving the left axilla.  By the following day, he had developed some nodules in the left axilla with surrounding redness, pain, and swelling.  He was evaluated by televisit for this on 02/06/2022, diagnosed with cellulitis, and started on Bactrim.     Today, he developed fairly diffuse chest pain that is severe with breathing or cough.  He reports a mild nonproductive cough; CXR on 8/20 indicated New moderate to large right pleural effusion.  2. Persistent bilateral nodular pulmonary opacities compatible with  septic emboli; BSE generated d/t reported odynophagia/coughing with liquids per family/nursing.   Assessment / Plan / Recommendation  Clinical Impression  Pt participated in limited BSE with mild decreased oral propulsion/holding d/t odynophagia secondary to candida observed within oral cavity (slight and improved since 8/19 d/t treatment being provided) paired with decreased alertness level.  Pt was easy to arouse to consume small amounts of regular/thin liquids with min verbal cues provided for small bites/sips for safety.  Pt stated "my swallowing has improved a lot since yesterday" referring to pain level/odynophagia.  Discussed continuing smaller volumes of liquids/bites of foods during increased respiratory effort to maximize swallowing/breathing reciprocity.  Eat/drink when alert, but regular/thin liquid diet recommended to continue.  Nursing stated pt able  to take medications with liquid without any apparent issues.   ST will s/o in acute setting.  Thank you for this consult. SLP Visit Diagnosis: Dysphagia, unspecified (R13.10);Dysphagia, oral phase (R13.11)    Aspiration Risk  No limitations    Diet  Recommendation   Regular/thin liquids  Medication Administration: Whole meds with liquid    Other  Recommendations Oral Care Recommendations: Oral care BID    Recommendations for follow up therapy are one component of a multi-disciplinary discharge planning process, led by the attending physician.  Recommendations may be updated based on patient status, additional functional criteria and insurance authorization.  Follow up Recommendations No SLP follow up      Assistance Recommended at Discharge Other (comment) (TBD)  Functional Status Assessment Patient has had a recent decline in their functional status and demonstrates the ability to make significant improvements in function in a reasonable and predictable amount of time.  Frequency and Duration Other (Comment) (evaluation only)          Prognosis Prognosis for Safe Diet Advancement: Good      Swallow Study   General Date of Onset: 02/07/22 HPI: Rick Lam is a pleasant 31 y.o. 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     Patient reports that he was feeling well on 02/05/2022 when he noticed a mild rash involving the left axilla.  By the following day, he had developed some nodules in the left axilla with surrounding redness, pain, and swelling.  He was evaluated by televisit for this on 02/06/2022, diagnosed with cellulitis, and started on Bactrim.     Today, he developed fairly diffuse chest pain that is severe with breathing or cough.  He reports a mild nonproductive cough; CXR on 8/20 indicated New moderate to large right pleural effusion.  2. Persistent bilateral nodular pulmonary opacities  compatible with  septic emboli; BSE generated Type of Study: Bedside Swallow Evaluation Previous Swallow Assessment: n/a Diet Prior to this Study: Regular;Thin liquids Temperature Spikes Noted: No Respiratory Status: Nasal cannula (4L) History of Recent Intubation: No Behavior/Cognition: Cooperative;Lethargic/Drowsy Oral Cavity Assessment: Other (comment) (mild lingual candida; being treated; odynophagia on 8/19, but improved) Oral Care Completed by SLP: Recent completion by staff Oral Cavity - Dentition: Adequate natural dentition Vision: Functional for self-feeding Self-Feeding Abilities: Able to feed self Patient Positioning: Upright in bed Baseline Vocal Quality: Normal Volitional Cough: Strong Volitional Swallow: Able to elicit    Oral/Motor/Sensory Function Overall Oral Motor/Sensory Function: Within functional limits   Ice Chips Ice chips: Not tested   Thin Liquid Thin Liquid: Within functional limits Presentation: Straw Other Comments: small sips d/t dyspnea    Nectar Thick Nectar Thick Liquid: Not tested   Honey Thick Honey Thick Liquid: Not tested   Puree Puree: Not tested   Solid     Solid: Within functional limits Presentation: Trevor Mace, M.S., CCC-SLP 02/12/2022,12:42 PM

## 2022-02-12 NOTE — Anesthesia Postprocedure Evaluation (Signed)
Anesthesia Post Note  Patient: Rick Lam  Procedure(s) Performed: TRANSESOPHAGEAL ECHOCARDIOGRAM (TEE)     Patient location during evaluation: Endoscopy Anesthesia Type: MAC Level of consciousness: awake and alert Pain management: pain level controlled Vital Signs Assessment: post-procedure vital signs reviewed and stable Respiratory status: spontaneous breathing and respiratory function stable Cardiovascular status: stable Postop Assessment: no apparent nausea or vomiting Anesthetic complications: no   No notable events documented.               Dinara Lupu DANIEL

## 2022-02-12 NOTE — Progress Notes (Signed)
Patient with continued increase in RR when awake and moving. Patient endorses difficulty taking a deep breathe and swallowing. MD at bedside see new orders   02/12/22 0851  Assess: MEWS Score  Temp 98.8 F (37.1 C)  BP 126/64  MAP (mmHg) 81  Resp (!) 30  Level of Consciousness Alert  SpO2 92 %  O2 Device Nasal Cannula  O2 Flow Rate (L/min) 4 L/min  Assess: MEWS Score  MEWS Temp 0  MEWS Systolic 0  MEWS Pulse 2  MEWS RR 2  MEWS LOC 0  MEWS Score 4  MEWS Score Color Red  Assess: if the MEWS score is Yellow or Red  Were vital signs taken at a resting state? Yes  Focused Assessment No change from prior assessment  Does the patient meet 2 or more of the SIRS criteria? Yes  Does the patient have a confirmed or suspected source of infection? Yes  Provider and Rapid Response Notified? No (MD at bedside)  MEWS guidelines implemented *See Row Information* No, previously red, continue vital signs every 4 hours  Treat  Pain Scale 0-10  Pain Score 7  Pain Type Acute pain  Pain Location Chest  Pain Descriptors / Indicators Heaviness;Discomfort  Pain Onset On-going  Pain Intervention(s) Medication (See eMAR);Repositioned  Notify: Provider  Provider Name/Title Dr. Tyrell Antonio  Date Provider Notified 02/12/22  Time Provider Notified 0845  Method of Notification Rounds  Provider response See new orders  Document  Progress note created (see row info) Yes  Assess: SIRS CRITERIA  SIRS Temperature  0  SIRS Pulse 1  SIRS Respirations  1  SIRS WBC 1  SIRS Score Sum  3

## 2022-02-12 NOTE — Progress Notes (Signed)
   02/11/22 2351  Assess: MEWS Score  Temp 98.7 F (37.1 C)  BP 126/64  MAP (mmHg) 76  Pulse Rate (!) 119  ECG Heart Rate (!) 117  Resp 16  SpO2 96 %  O2 Device Nasal Cannula  O2 Flow Rate (L/min) 4 L/min (pt is a mouth breather)  Assess: MEWS Score  MEWS Temp 0  MEWS Systolic 0  MEWS Pulse 2  MEWS RR 0  MEWS LOC 0  MEWS Score 2  MEWS Score Color Yellow  Assess: if the MEWS score is Yellow or Red  Were vital signs taken at a resting state? Yes  Focused Assessment No change from prior assessment  Document  Patient Outcome Stabilized after interventions  Progress note created (see row info) Yes  Assess: SIRS CRITERIA  SIRS Temperature  0  SIRS Pulse 1  SIRS Respirations  0  SIRS WBC 1  SIRS Score Sum  2

## 2022-02-12 NOTE — Progress Notes (Signed)
   02/12/22 0424  Assess: MEWS Score  Temp 98.6 F (37 C)  BP 111/60  MAP (mmHg) 73  Pulse Rate (!) 122  ECG Heart Rate (!) 122  Resp 19  SpO2 94 %  Assess: MEWS Score  MEWS Temp 0  MEWS Systolic 0  MEWS Pulse 2  MEWS RR 0  MEWS LOC 0  MEWS Score 2  MEWS Score Color Yellow  Assess: if the MEWS score is Yellow or Red  Were vital signs taken at a resting state? Yes  Focused Assessment No change from prior assessment  Document  Patient Outcome Stabilized after interventions  Progress note created (see row info) Yes  Assess: SIRS CRITERIA  SIRS Temperature  0  SIRS Pulse 1  SIRS Respirations  0  SIRS WBC 1  SIRS Score Sum  2

## 2022-02-12 NOTE — Progress Notes (Signed)
   02/11/22 2008  Assess: MEWS Score  Temp 98.7 F (37.1 C)  BP 134/73  MAP (mmHg) 87  Pulse Rate (!) 122  ECG Heart Rate (!) 121  Resp 20  SpO2 93 %  O2 Device Nasal Cannula  O2 Flow Rate (L/min) 2 L/min  Assess: MEWS Score  MEWS Temp 0  MEWS Systolic 0  MEWS Pulse 2  MEWS RR 0  MEWS LOC 0  MEWS Score 2  MEWS Score Color Yellow  Assess: if the MEWS score is Yellow or Red  Were vital signs taken at a resting state? Yes  Focused Assessment No change from prior assessment (pt's respirations have been up on O2, and pt has PNA, pt is also very anxious, once anxiety subsides pt's respirations are WNL)  Does the patient meet 2 or more of the SIRS criteria? Yes  Does the patient have a confirmed or suspected source of infection? Yes  Document  Patient Outcome Stabilized after interventions  Progress note created (see row info) Yes  Assess: SIRS CRITERIA  SIRS Temperature  0  SIRS Pulse 1  SIRS Respirations  0  SIRS WBC 1  SIRS Score Sum  2

## 2022-02-13 ENCOUNTER — Encounter (HOSPITAL_COMMUNITY): Payer: Self-pay | Admitting: Cardiovascular Disease

## 2022-02-13 ENCOUNTER — Inpatient Hospital Stay (HOSPITAL_COMMUNITY): Payer: Self-pay

## 2022-02-13 DIAGNOSIS — B370 Candidal stomatitis: Secondary | ICD-10-CM

## 2022-02-13 LAB — QUANTIFERON-TB GOLD PLUS (RQFGPL)
QuantiFERON Mitogen Value: 9.66 IU/mL
QuantiFERON Nil Value: 0.04 IU/mL
QuantiFERON TB1 Ag Value: 0.04 IU/mL
QuantiFERON TB2 Ag Value: 0.05 IU/mL

## 2022-02-13 LAB — VANCOMYCIN, PEAK: Vancomycin Pk: 31 ug/mL (ref 30–40)

## 2022-02-13 LAB — QUANTIFERON-TB GOLD PLUS: QuantiFERON-TB Gold Plus: NEGATIVE

## 2022-02-13 LAB — VANCOMYCIN, TROUGH: Vancomycin Tr: 11 ug/mL — ABNORMAL LOW (ref 15–20)

## 2022-02-13 MED ORDER — IPRATROPIUM-ALBUTEROL 0.5-2.5 (3) MG/3ML IN SOLN: 3.0000 mL | RESPIRATORY_TRACT | Status: DC | PRN

## 2022-02-13 MED ORDER — FLUCONAZOLE 100 MG PO TABS
200.0000 mg | ORAL_TABLET | Freq: Every day | ORAL | Status: DC
  Administered 2022-02-13 – 2022-02-21 (×9): 200 mg via ORAL
  Filled 2022-02-13 (×4): qty 1
  Filled 2022-02-13: qty 2
  Filled 2022-02-13: qty 1
  Filled 2022-02-13: qty 2
  Filled 2022-02-13: qty 1
  Filled 2022-02-13: qty 2

## 2022-02-13 MED ORDER — HYDRALAZINE HCL 20 MG/ML IJ SOLN: 10.0000 mg | INTRAMUSCULAR | Status: DC | PRN

## 2022-02-13 MED ORDER — LIDOCAINE 5 % EX PTCH
2.0000 | MEDICATED_PATCH | CUTANEOUS | Status: DC
  Administered 2022-02-13 – 2022-02-17 (×2): 2 via TRANSDERMAL
  Filled 2022-02-13 (×7): qty 2

## 2022-02-13 MED ORDER — METOPROLOL TARTRATE 5 MG/5ML IV SOLN
5.0000 mg | INTRAVENOUS | Status: DC | PRN
  Filled 2022-02-13: qty 5

## 2022-02-13 MED ORDER — DOCUSATE SODIUM 100 MG PO CAPS
100.0000 mg | ORAL_CAPSULE | Freq: Two times a day (BID) | ORAL | Status: DC
  Administered 2022-02-13 – 2022-02-14 (×4): 100 mg via ORAL
  Filled 2022-02-13 (×4): qty 1

## 2022-02-13 NOTE — Progress Notes (Signed)
   02/13/22 0428  Assess: MEWS Score  Temp 98.7 F (37.1 C)  BP (!) 112/56  MAP (mmHg) 70  Pulse Rate (!) 119  ECG Heart Rate (!) 118  Resp (!) 21  SpO2 93 %  Assess: MEWS Score  MEWS Temp 0  MEWS Systolic 0  MEWS Pulse 2  MEWS RR 1  MEWS LOC 0  MEWS Score 3  MEWS Score Color Yellow  Assess: if the MEWS score is Yellow or Red  Were vital signs taken at a resting state? Yes  Focused Assessment No change from prior assessment  Document  Patient Outcome Stabilized after interventions  Progress note created (see row info) Yes  Assess: SIRS CRITERIA  SIRS Temperature  0  SIRS Pulse 1  SIRS Respirations  1  SIRS WBC 1  SIRS Score Sum  3

## 2022-02-13 NOTE — Progress Notes (Signed)
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  Presented with painful left axillary nodules with surrounding erythema he was admitted with sepsis, found to have MRSA bacteremia secondary to axillary abscess and cellulitis.  He underwent TEE 8/18 which was negative for endocarditis.  ID is following.   Assessment & Plan:  Principal Problem:   Severe sepsis ([place]) Active Problems:   AIDS (acquired immune deficiency syndrome) (HCC)   Hyponatremia   Acute respiratory failure with hypoxia (HCC)   Acute septic pulmonary embolism without acute cor pulmonale (HCC)   Cutaneous abscess of left upper extremity   Sepsis secondary to MRSA bacteremia,  bilateral likely pulmonary septic emboli, Left arm cellulitis and abscess:  -Immunocompromise state, HIV/AIDS.  Initial cultures on 8/15 positive for MRSA but repeat cultures have remained negative.  MRI left upper extremity showed small abscess but Ortho did not recommend drainage.  TEE negative for endocarditis -Pulmonary consulted recommending outpatient bronchoscopy at the end of antibiotic treatment.  Chest tube placed 8/20. May need tPA if fails to improve, will defer to [facility].  -Patient also had axillary abscess, general surgery recommending warm compresses, IV antibiotics, no drainage -QuantiFERON-pending - Antibiotics-IV vancomycin   Acute hypoxic respiratory failure: Bilateral pulmonary nodules, concern with septic emboli -PCCM consulted due to worsening respiratory distress secondary to right-sided pleural effusion concerning for empyema.  Chest tube placed 8/20 I-S/flutter valve, as needed bronchodilators   HIV/AIDS: ID recommending Biktarvy/Bactrim for prophylaxis - Continue IV vancomycin   Hyponatremia: Improved  continue to monitor   AKI: - Resolved.  Creatinine now at baseline of 0.6  Oral thrush; odynophagia; - IV Diflucan   Estimated body mass index is 22.72 kg/m as calculated from the following:   Height as of this encounter: 6' (1.829 m).   Weight as of this encounter: 76 kg.     DVT prophylaxis: Lovenox Code Status: Full code Family Communication: Family is at bedside Disposition Plan:  Status is: Inpatient Remains inpatient appropriate because: Continue with management of MRSA bacteremia, axillary abscess, bilateral pulmonary septic emboli.    Anti-infectives: IV vancomycin Prophylaxis Bactrim Biktarvy   Subjective: Seen and examined, tells me he feels little short of breath and has chest discomfort at the chest tube insertion site.  He was able to put out decent output in his chest tube.    Examination: Constitutional: Not in acute distress Respiratory: Bibasilar rhonchi Cardiovascular: Normal sinus rhythm, no rubs Abdomen: Nontender nondistended good bowel sounds Musculoskeletal: No edema noted Skin: No rashes seen Neurologic: CN 2-12 grossly intact.  And nonfocal Psychiatric: Normal judgment and insight. Alert and oriented x 3. Normal mood. Chest tube currently in place  Objective: Vitals:   02/12/22 2051 02/12/22 2310 02/13/22 0100 02/13/22 0428  BP:  (!) 110/51  (!) 112/56  Pulse:  (!) 116  (!) 119  Resp:  (!) 22  (!) 21  Temp:  99.6 F (37.6 C) 98.9 F (37.2 C) 98.7 F (37.1 C)  TempSrc:  Oral Oral Oral  SpO2: 98% 95%  93%  Weight:    78.3 kg  Height:        Intake/Output Summary (Last 24 hours) at 02/13/2022 0724 Last data filed at 02/13/2022 0500 Gross per 24 hour  Intake 884.49 ml  Output 645 ml  Net 239.49  ml   Filed Weights   02/11/22 0311 02/12/22 0424 02/13/22 0428  Weight: 75 kg 76 kg 78.3 kg     Data Reviewed:   CBC: Recent Labs  Lab 02/07/22 1701 02/08/22 0410 02/09/22 0432 02/10/22 0746 02/11/22 0341 02/12/22 0449  WBC  12.2* 7.6 8.6 10.6* 12.1* 9.6  NEUTROABS 11.2* 6.5 6.8 8.4* 8.7*  --   HGB 14.2 11.4* 11.0* 10.8* 11.5* 10.2*  HCT 43.7 33.9* 32.3* 32.8* 34.3* 29.8*  MCV 84.5 82.9 80.5 83.0 81.9 80.3  PLT 156 104* 134* 148* 185 697   Basic Metabolic Panel: Recent Labs  Lab 02/08/22 0410 02/09/22 0432 02/10/22 0746 02/11/22 0341 02/12/22 0449  NA 130* 128* 129* 131* 131*  K 3.7 3.7 3.6 3.6 3.5  CL 102 97* 95* 92* 94*  CO2 21* '24 26 28 28  '$ GLUCOSE 92 102* 158* 108* 107*  BUN '14 9 6 '$ 5* 6  CREATININE 1.06 0.80 0.65 0.57* 0.65  CALCIUM 7.2* 7.7* 7.7* 7.8* 7.4*  MG  --   --   --  1.9  --    GFR: Estimated Creatinine Clearance: 144.2 mL/min (by C-G formula based on SCr of 0.65 mg/dL). Liver Function Tests: Recent Labs  Lab 02/07/22 1701 02/08/22 0410 02/09/22 0432 02/10/22 0746  AST 55* 37 28 18  ALT 44 '30 27 20  '$ ALKPHOS 82 59 63 61  BILITOT 0.9 0.7 0.8 0.6  PROT 8.0 5.4* 5.6* 5.4*  ALBUMIN 3.3* 2.1* 2.1* 2.0*   No results for input(s): "LIPASE", "AMYLASE" in the last 168 hours. No results for input(s): "AMMONIA" in the last 168 hours. Coagulation Profile: Recent Labs  Lab 02/07/22 1821  INR 1.3*   Cardiac Enzymes: No results for input(s): "CKTOTAL", "CKMB", "CKMBINDEX", "TROPONINI" in the last 168 hours. BNP (last 3 results) No results for input(s): "PROBNP" in the last 8760 hours. HbA1C: No results for input(s): "HGBA1C" in the last 72 hours. CBG: Recent Labs  Lab 02/11/22 1756  GLUCAP 158*   Lipid Profile: No results for input(s): "CHOL", "HDL", "LDLCALC", "TRIG", "CHOLHDL", "LDLDIRECT" in the last 72 hours. Thyroid Function Tests: Recent Labs    02/10/22 0746  TSH 0.401   Anemia Panel: No results for input(s): "VITAMINB12", "FOLATE", "FERRITIN", "TIBC", "IRON", "RETICCTPCT" in the last 72 hours. Sepsis Labs: Recent Labs  Lab 02/07/22 1701 02/07/22 1901  LATICACIDVEN 4.1* 1.3    Recent Results (from the past 240 hour(s))  Blood Culture (routine x 2)      Status: Abnormal   Collection Time: 02/07/22  5:01 PM   Specimen: BLOOD RIGHT HAND  Result Value Ref Range Status   Specimen Description   Final    BLOOD RIGHT HAND Performed at [facility], [address] [place], [place] [address]    Special Requests   Final    BOTTLES DRAWN AEROBIC AND ANAEROBIC Blood Culture results may not be optimal due to an inadequate volume of blood received in culture bottles Performed at [facility], [address], [place], [place] [address]    Culture  Setup Time   Final    GRAM POSITIVE COCCI BOTTLES DRAWN AEROBIC AND ANAEROBIC Gram Stain Report Called to,Read Back By and Verified With: GUILBEAULT,D '@0745'$  BY MATTHEWS, B 8.16.2023 IN BOTH AEROBIC AND ANAEROBIC BOTTLES    Culture (A)  Final    STAPHYLOCOCCUS AUREUS SUSCEPTIBILITIES PERFORMED ON PREVIOUS CULTURE WITHIN THE LAST 5 DAYS. Performed at [facility], [place] [address] [place], [place] [address]    Report  Status 02/10/2022 FINAL  Final  Blood Culture (routine x 2)     Status: Abnormal   Collection Time: 02/07/22  5:01 PM   Specimen: BLOOD RIGHT ARM  Result Value Ref Range Status   Specimen Description   Final    BLOOD RIGHT ARM Performed at [facility], [address], [place], [place] [address]    Special Requests   Final    BOTTLES DRAWN AEROBIC AND ANAEROBIC Blood Culture results may not be optimal due to an inadequate volume of blood received in culture bottles Performed at [place] [place], [place] [address]    Culture  Setup Time   Final    GRAM POSITIVE COCCI BOTTLES DRAWN AEROBIC AND ANAEROBIC Gram Stain Report Called to,Read Back By and Verified With: GUILBEAULT,D'@0745'$  BY MATTHEWS, B 8.16.2023 IN BOTH AEROBIC AND ANAEROBIC BOTTLES GRAM STAIN REVIEWED-AGREE WITH RESULT CRITICAL RESULT CALLED TO, READ BACK BY AND VERIFIED WITH: Noemi Chapel [id number] AT 1411 BY EC Performed at [facility], [place] [address], [place], [place] [address]     Culture METHICILLIN RESISTANT STAPHYLOCOCCUS AUREUS (A)  Final   Report Status 02/10/2022 FINAL  Final   Organism ID, Bacteria METHICILLIN RESISTANT STAPHYLOCOCCUS AUREUS  Final      Susceptibility   Methicillin resistant staphylococcus aureus - MIC*    CIPROFLOXACIN >=8 RESISTANT Resistant     ERYTHROMYCIN >=8 RESISTANT Resistant     GENTAMICIN <=0.5 SENSITIVE Sensitive     OXACILLIN >=4 RESISTANT Resistant     TETRACYCLINE >=16 RESISTANT Resistant     VANCOMYCIN <=0.5 SENSITIVE Sensitive     TRIMETH/SULFA >=320 RESISTANT Resistant     CLINDAMYCIN <=0.25 SENSITIVE Sensitive     RIFAMPIN <=0.5 SENSITIVE Sensitive     Inducible Clindamycin NEGATIVE Sensitive     * METHICILLIN RESISTANT STAPHYLOCOCCUS AUREUS  Blood Culture ID Panel (Reflexed)     Status: Abnormal   Collection Time: 02/07/22  5:01 PM  Result Value Ref Range Status   Enterococcus faecalis NOT DETECTED NOT DETECTED Final   Enterococcus Faecium NOT DETECTED NOT DETECTED Final   Listeria monocytogenes NOT DETECTED NOT DETECTED Final   Staphylococcus species DETECTED (A) NOT DETECTED Final    Comment: CRITICAL RESULT CALLED TO, READ BACK BY AND VERIFIED WITH: PHARMD Alycia Rossetti [id number] AT 1411 BY EC    Staphylococcus aureus (BCID) DETECTED (A) NOT DETECTED Final    Comment: Methicillin (oxacillin)-resistant Staphylococcus aureus (MRSA). MRSA is predictably resistant to beta-lactam antibiotics (except ceftaroline). Preferred therapy is vancomycin unless clinically contraindicated. Patient requires contact precautions if  hospitalized. CRITICAL RESULT CALLED TO, READ BACK BY AND VERIFIED WITH: [place] [id number] AT 8675 BY EC    Staphylococcus epidermidis NOT DETECTED NOT DETECTED Final   Staphylococcus lugdunensis NOT DETECTED NOT DETECTED Final   Streptococcus species NOT DETECTED NOT DETECTED Final   Streptococcus agalactiae NOT DETECTED NOT DETECTED Final   Streptococcus pneumoniae NOT DETECTED NOT  DETECTED Final   Streptococcus pyogenes NOT DETECTED NOT DETECTED Final   A.calcoaceticus-baumannii NOT DETECTED NOT DETECTED Final   Bacteroides fragilis NOT DETECTED NOT DETECTED Final   Enterobacterales NOT DETECTED NOT DETECTED Final   Enterobacter cloacae complex NOT DETECTED NOT DETECTED Final   Escherichia coli NOT DETECTED NOT DETECTED Final   Klebsiella aerogenes NOT DETECTED NOT DETECTED Final   Klebsiella oxytoca NOT DETECTED NOT DETECTED Final   Klebsiella pneumoniae NOT DETECTED NOT DETECTED Final   Proteus species NOT DETECTED NOT DETECTED Final  Salmonella species NOT DETECTED NOT DETECTED Final   Serratia marcescens NOT DETECTED NOT DETECTED Final   Haemophilus influenzae NOT DETECTED NOT DETECTED Final   Neisseria meningitidis NOT DETECTED NOT DETECTED Final   Pseudomonas aeruginosa NOT DETECTED NOT DETECTED Final   Stenotrophomonas maltophilia NOT DETECTED NOT DETECTED Final   Candida albicans NOT DETECTED NOT DETECTED Final   Candida auris NOT DETECTED NOT DETECTED Final   Candida glabrata NOT DETECTED NOT DETECTED Final   Candida krusei NOT DETECTED NOT DETECTED Final   Candida parapsilosis NOT DETECTED NOT DETECTED Final   Candida tropicalis NOT DETECTED NOT DETECTED Final   Cryptococcus neoformans/gattii NOT DETECTED NOT DETECTED Final   Meth resistant mecA/C and MREJ DETECTED (A) NOT DETECTED Final    Comment: CRITICAL RESULT CALLED TO, READ BACK BY AND VERIFIED WITH: Noemi Chapel [phone] AT 1411 BY EC Performed at [facility], [address], [place], [place] [address]   Resp Panel by RT-PCR (Flu A&B, Covid) Anterior Nasal Swab     Status: None   Collection Time: 02/07/22  5:20 PM   Specimen: Anterior Nasal Swab  Result Value Ref Range Status   SARS Coronavirus 2 by RT PCR NEGATIVE NEGATIVE Final    Comment: (NOTE) SARS-CoV-2 target nucleic acids are NOT DETECTED.  The SARS-CoV-2 RNA is generally detectable in upper  respiratory specimens during the acute phase of infection. The lowest concentration of SARS-CoV-2 viral copies this assay can detect is 138 copies/mL. A negative result does not preclude SARS-Cov-2 infection and should not be used as the sole basis for treatment or other patient management decisions. A negative result may occur with  improper specimen collection/handling, submission of specimen other than nasopharyngeal swab, presence of viral mutation(s) within the areas targeted by this assay, and inadequate number of viral copies(<138 copies/mL). A negative result must be combined with clinical observations, patient history, and epidemiological information. The expected result is Negative.  Fact Sheet for Patients:  EntrepreneurPulse.com.au  Fact Sheet for Healthcare Providers:  IncredibleEmployment.be  This test is no t yet approved or cleared by the [place] FDA and  has been authorized for detection and/or diagnosis of SARS-CoV-2 by FDA under an Emergency Use Authorization (EUA). This EUA will remain  in effect (meaning this test can be used) for the duration of the COVID-19 declaration under Section 564(b)(1) of the Act, 21 U.S.C.section 360bbb-3(b)(1), unless the authorization is terminated  or revoked sooner.       Influenza A by PCR NEGATIVE NEGATIVE Final   Influenza B by PCR NEGATIVE NEGATIVE Final    Comment: (NOTE) The Xpert Xpress SARS-CoV-2/FLU/RSV plus assay is intended as an aid in the diagnosis of influenza from Nasopharyngeal swab specimens and should not be used as a sole basis for treatment. Nasal washings and aspirates are unacceptable for Xpert Xpress SARS-CoV-2/FLU/RSV testing.  Fact Sheet for Patients: EntrepreneurPulse.com.au  Fact Sheet for Healthcare Providers: IncredibleEmployment.be  This test is not yet approved or cleared by the [place] FDA and has been  authorized for detection and/or diagnosis of SARS-CoV-2 by FDA under an Emergency Use Authorization (EUA). This EUA will remain in effect (meaning this test can be used) for the duration of the COVID-19 declaration under Section 564(b)(1) of the Act, 21 U.S.C. section 360bbb-3(b)(1), unless the authorization is terminated or revoked.  Performed at [facility], [address] [place], [place] [address]   Urine Culture     Status: None   Collection Time: 02/07/22 11:32 PM  Specimen: In/Out Cath Urine  Result Value Ref Range Status   Specimen Description IN/OUT CATH URINE  Final   Special Requests NONE  Final   Culture   Final    NO GROWTH Performed at [facility], [address], [place], [place] [address]    Report Status 02/08/2022 FINAL  Final  Culture, blood (Routine X 2) w Reflex to ID Panel     Status: None (Preliminary result)   Collection Time: 02/09/22  4:32 AM   Specimen: BLOOD RIGHT HAND  Result Value Ref Range Status   Specimen Description BLOOD RIGHT HAND  Final   Special Requests   Final    AEROBIC BOTTLE ONLY Blood Culture results may not be optimal due to an excessive volume of blood received in culture bottles   Culture   Final    NO GROWTH 3 DAYS Performed at [facility], [place] [address] [place], [place] [address]    Report Status PENDING  Incomplete  Culture, blood (Routine X 2) w Reflex to ID Panel     Status: None (Preliminary result)   Collection Time: 02/09/22  4:32 AM   Specimen: BLOOD LEFT HAND  Result Value Ref Range Status   Specimen Description BLOOD LEFT HAND  Final   Special Requests   Final    AEROBIC BOTTLE ONLY Blood Culture results may not be optimal due to an excessive volume of blood received in culture bottles   Culture   Final    NO GROWTH 3 DAYS Performed at [facility], [place] [address], [place], [place] [address]    Report Status PENDING  Incomplete  Culture, blood (Routine X 2) w Reflex to ID Panel     Status:  None (Preliminary result)   Collection Time: 02/11/22  3:49 AM   Specimen: BLOOD LEFT HAND  Result Value Ref Range Status   Specimen Description BLOOD LEFT HAND  Final   Special Requests   Final    BOTTLES DRAWN AEROBIC ONLY Blood Culture results may not be optimal due to an inadequate volume of blood received in culture bottles   Culture   Final    NO GROWTH 1 DAY Performed at [facility], [place] [address] [place], [place] [address]    Report Status PENDING  Incomplete  Culture, blood (Routine X 2) w Reflex to ID Panel     Status: None (Preliminary result)   Collection Time: 02/11/22  3:57 AM   Specimen: BLOOD RIGHT HAND  Result Value Ref Range Status   Specimen Description BLOOD RIGHT HAND  Final   Special Requests   Final    BOTTLES DRAWN AEROBIC ONLY Blood Culture results may not be optimal due to an inadequate volume of blood received in culture bottles   Culture   Final    NO GROWTH 1 DAY Performed at [facility], [place] [address] [place], [place] [address]    Report Status PENDING  Incomplete  Body fluid culture w Gram Stain     Status: None (Preliminary result)   Collection Time: 02/12/22  4:46 PM   Specimen: Pleural Fluid  Result Value Ref Range Status   Specimen Description FLUID  Final   Special Requests Immunocompromised  Final   Gram Stain   Final    NO WBC SEEN NO ORGANISMS SEEN Performed at [facility], [place] [address], [place],  [address]    Culture PENDING  Incomplete  Report Status PENDING  Incomplete         Radiology Studies: DG CHEST PORT 1 VIEW  Result Date: 02/12/2022 CLINICAL DATA:  Chest tube placement EXAM: PORTABLE CHEST 1 VIEW COMPARISON:  Earlier today at 9:24 a.m. FINDINGS: 5:36 p.m. Placement of a right-sided pigtail pleural catheter. Midline trachea. Normal heart size for level of inspiration. Decrease in small to moderate right-sided pleural effusion with possible loculation laterally. No pneumothorax. Lung  volumes are extremely low. Slight improvement in right basilar aeration. Otherwise, similar basilar predominant airspace disease bilaterally. IMPRESSION: Placement of a right-sided pleural drain with decreased pleural fluid and improved right base aeration. Otherwise, similar bilateral airspace disease. Electronically Signed   By: Abigail Miyamoto M.D.   On: 02/12/2022 18:14   DG CHEST PORT 1 VIEW  Result Date: 02/12/2022 CLINICAL DATA:  Dyspnea. EXAM: PORTABLE CHEST 1 VIEW COMPARISON:  02/07/2022 FINDINGS: Stable cardiomediastinal contours. New moderate to large right pleural effusion with veil like opacification over the right lung. Persistent nodular pulmonary opacities throughout both lungs compatible with septic emboli. The visualized osseous structures are unremarkable. IMPRESSION: 1. New moderate to large right pleural effusion. 2. Persistent bilateral nodular pulmonary opacities compatible with septic emboli. Electronically Signed   By: Kerby Moors M.D.   On: 02/12/2022 09:24        Scheduled Meds:  bictegravir-emtricitabine-tenofovir AF  1 tablet Oral Daily   enoxaparin (LOVENOX) injection  40 mg Subcutaneous Q24H   guaiFENesin  600 mg Oral BID   ipratropium-albuterol  3 mL Nebulization TID   sodium chloride flush  3 mL Intravenous Q12H   sulfamethoxazole-trimethoprim  1 tablet Oral Once per day on Mon Wed Fri   Continuous Infusions:  sodium chloride Stopped (02/12/22 1100)   fluconazole (DIFLUCAN) IV 100 mg (02/12/22 1756)   vancomycin 1,250 mg (02/13/22 0142)     LOS: 6 days   Time spent= 35 mins    Nikoleta Dady Arsenio Loader, MD [facility]  If 7PM-7AM, please contact night-coverage  02/13/2022, 7:24 AM

## 2022-02-13 NOTE — Progress Notes (Signed)
Pharmacy Antibiotic Note  Rick Lam is a 31 y.o. male admitted on 02/07/2022 with MRSA bacteremia.  Pharmacy has been consulted for vancomycin dosing.  Vanc peak = 31, vanc trough = 11 >>AUC 519   Plan: Continue vanc '1250mg'$  IV q8  Height: 6' (182.9 cm) Weight: 78.3 kg (172 lb 9.9 oz) IBW/kg (Calculated) : 77.6  Temp (24hrs), Avg:98.9 F (37.2 C), Min:98 F (36.7 C), Max:100.2 F (37.9 C)  Recent Labs  Lab 02/07/22 1701 02/07/22 1901 02/08/22 0410 02/09/22 0432 02/10/22 0746 02/10/22 0926 02/10/22 1447 02/11/22 0341 02/12/22 0449 02/13/22 1135 02/13/22 1612  WBC 12.2*  --  7.6 8.6 10.6*  --   --  12.1* 9.6  --   --   CREATININE 1.38*  --  1.06 0.80 0.65  --   --  0.57* 0.65  --   --   LATICACIDVEN 4.1* 1.3  --   --   --   --   --   --   --   --   --   VANCOTROUGH  --   --   --   --   --   --  9*  --   --   --  11*  VANCOPEAK  --   --   --   --  88*  --   --   --   --  31  --   VANCORANDOM  --   --   --   --   --  21  --   --   --   --   --      Estimated Creatinine Clearance: 144.2 mL/min (by C-G formula based on SCr of 0.65 mg/dL).    Allergies  Allergen Reactions   Banana     Other reaction(s): hives    Antimicrobials this admission: cefepime 8/15 >> 8/16 vancomycin 8/15 >> Bactrim 8/16 (for PJP ppx) >> metronidazole 8/15 '500mg'$  x 1  Dose adjustments this admission: Vanco 1000 q8h >> '1250mg'$  q8h on 8/18  Microbiology results: 8/15 BCID: MRSA 8/15 Ucx: NG 8/17 Bcx: NGTD  Onnie Boer, PharmD, BCIDP, AAHIVP, CPP Infectious Disease Pharmacist 02/13/2022 5:23 PM

## 2022-02-13 NOTE — Progress Notes (Signed)
   NAME:  Rick Lam, MRN:  [id number], DOB:  Apr 25, 1991, LOS: 6 ADMISSION DATE:  02/07/2022, CONSULTATION DATE:  02/07/2022 REFERRING MD:  Roderic Palau, CHIEF COMPLAINT:  fever, chest discomfort, weakness   History of Present Illness:  Rick Lam is a 31 year old male with HIV/AIDS on biktarvy, chronic hepatitis C, latent syphillis s/p treatment who presented with painful left axillary nodules with erythema, found to have sepsis with MRSA bacteremia along with axillary abscess, cellulitis and septic pulmonary emboli.   TEE 8/18 negative for vegetations. ID is following and patient has been on vancomycin. PCCM consulted due to increasing respiratory distress and new right pleural effusion.  Pertinent Medical History:  HIV/AIDS [place] Late latent syphilis, treated Chronic HCV  Significant Hospital Events: Including procedures, antibiotic start and stop dates in addition to other pertinent events   02/06/22 transfer from [facility] to ED, started on vanc/cefepime 8/15 - CT Chest with innumerable pulmonary nodules bilaterally, several cavitated; asymmetrical skin thickening/SQ edema in L axilla c/f cellulitis. PCCM consulted. 8/16 - TTE with EF 65-70%, LE dopplers negative for DVT. MRI L humerus with severe upper arm cellulitis, two small 1.5cm abscesses. ID consulted 8/17 - Ortho consult for cellulitis, conservative management with IV abx. BCx positive for MRSA, vanc-sensitive. 8/18 - TEE with Cards, r/o vegetation. Persistent pain. Cellulitis/erythema improving. 8/20 - chest tube placed   Interim History / Subjective:   Doing ok today. Minimal chest pain.   Objective:  Blood pressure (!) 110/50, pulse (!) 109, temperature 100.2 F (37.9 C), temperature source Oral, resp. rate 19, height 6' (1.829 m), weight 78.3 kg, SpO2 94 %.        Intake/Output Summary (Last 24 hours) at 02/13/2022 1126 Last data filed at 02/13/2022 0500 Gross per 24 hour  Intake 884.49 ml  Output 370 ml  Net 514.49 ml    Filed Weights   02/11/22 0311 02/12/22 0424 02/13/22 0428  Weight: 75 kg 76 kg 78.3 kg    Physical Examination: General: Young gentleman, chronically ill-appearing HEENT: NCAT, tracking appropriately Neuro: Alert oriented following commands CV: Regular rhythm, S1-S2 PULM: Diminished breath sounds bilaterally GI: Soft, nontender nondistended Extremities: No significant edema  Assessment & Plan:   Sepsis due to MRSA Bacteremia, septic pulmonary emboli and left arm abscess/cellulitis Acute Hypoxemic Respiratory Failure Right Pleural Effusion HIV/AIDS  Plan: Chest tube placed yesterday. Has good chest drain output. Continue to follow. CT chest today. If concern for loculation may need to consider intrapleural lytic therapy.  Garner Nash, DO [facility] 02/13/2022 11:27 AM

## 2022-02-13 NOTE — Progress Notes (Signed)
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, mediastinal/axillary lymphadenopathy, chronic hep c, hx syphilis late latent s/p tx admitted for sepsis found to have LUE cellulitis/abscess, pulm nodules, and mrsa bacteremia   #mrsa septicemia #pulm septic nodules #pleural effusion #left arm cellulitis/abscess tee 8/18 no vegetation serial xray chest showed evolving new pleural effusion right side s/p chest tube placement 8/20; cx ngtd ortho/general surgery evluated left arm and no indication for surgical intervention postive admission bcx 8/15; repeat bcx 8/17 and 8/19 negative   -appreciate pulm help; agree with chest ct -f/u 8/20 bacterial culture -will continue vancomycin     #hiv #aids #axillary/mediastinal LAD He has one value of hiv viral load in the 300k after being virologically controlled. Report compliance and I do believe him. He is due for a repeat viral load. He mentioned he missed 1 week as he grabbed a wrong bottle coming to his aunt's house a week ago to help her with chores. His cd4% has been increasing since being on biktarvy LAD likely hiv related -- improved axillary adenopathy on repeat chest ct this admission Repeat viral load this admission 16k in setting missing 1 week art  -will continue bactrim prophy and biktarvy -repeat viral load planned 3-4 weeks from 8/20; if continue to be high will need to send repeat genotype   #hx hep c infection He has positive hep c lab 05/2021 but repeat viral load (hcv genotype testing) 08/2021 was negative. Repeat hep c viral load this admission negative. Likely cleared it spontaneously    #s/p late latent syphilis tx and rpr improving appropriately    #oral thrush Primary team conconcerned about oral thrush. Ok with 14 days oral  fluconazole 200 mg daily    Plan: Chest tube management per pulm Await chest ct Continue vanc Continue biktarvy and bactrim prophy 2 weeks oral fluconazole 200 mg daily until 9/4 Discussed with primary team   I spent more than 35 minute reviewing data/chart, and coordinating care and >50% direct face to face time providing counseling/discussing diagnostics/treatment plan with patient   Principal Problem:   Severe sepsis ([place]) Active Problems:   AIDS (acquired immune deficiency syndrome) ([place])   Hyponatremia   Acute respiratory failure with hypoxia ([place])   Acute septic pulmonary embolism without acute cor pulmonale (HCC)   Cutaneous abscess of left upper extremity   Allergies  Allergen Reactions   Banana     Other reaction(s): hives    Scheduled Meds:  bictegravir-emtricitabine-tenofovir AF  1 tablet Oral Daily   docusate sodium  100 mg Oral BID   enoxaparin (LOVENOX) injection  40 mg Subcutaneous Q24H   guaiFENesin  600 mg Oral BID   lidocaine  2 patch Transdermal Q24H   sodium chloride flush  3 mL Intravenous Q12H   sulfamethoxazole-trimethoprim  1 tablet Oral Once per day on Mon Wed Fri   Continuous Infusions:  sodium chloride Stopped (02/12/22 1100)   fluconazole (DIFLUCAN) IV 100 mg (02/12/22 1756)   vancomycin 1,250 mg (02/13/22 0900)  PRN Meds:.acetaminophen **OR** acetaminophen, hydrALAZINE, HYDROmorphone (DILAUDID) injection, hydrOXYzine, ipratropium-albuterol, metoprolol tartrate, naLOXone (NARCAN)  injection, ondansetron **OR** ondansetron (ZOFRAN) IV, oxyCODONE, senna-docusate, traZODone   SUBJECTIVE: Feeling better but still significant left arm/chest pain  No dysphagia or pain with swallowing  Chest tube placed over the weekend for right sided pleural effusion  Fluconazole started for concern of oral thrush    Review of Systems: ROS All other ROS was negative, except mentioned above     OBJECTIVE: Vitals:   02/13/22 0100 02/13/22 0428  02/13/22 0810 02/13/22 0901  BP:  (!) 112/56  (!) 110/50  Pulse:  (!) 119 (!) 109   Resp:  (!) 21 (!) 25 19  Temp: 98.9 F (37.2 C) 98.7 F (37.1 C)  100.2 F (37.9 C)  TempSrc: Oral Oral  Oral  SpO2:  93% 97% 94%  Weight:  78.3 kg    Height:       Body mass index is 23.41 kg/m.  Physical Exam  General/constitutional: no distress, pleasant HEENT: Normocephalic, PER, Conj Clear, EOMI, Oropharynx clear Neck supple CV: rrr no mrg Lungs: clear to auscultation, normal respiratory effort; right chest tube serosanguinous output Abd: Soft, Nontender Ext: no edema Skin/msk: left arm/chest erythem markedly better; tender left arm   Lab Results Lab Results  Component Value Date   WBC 9.6 02/12/2022   HGB 10.2 (L) 02/12/2022   HCT 29.8 (L) 02/12/2022   MCV 80.3 02/12/2022   PLT 238 02/12/2022    Lab Results  Component Value Date   CREATININE 0.65 02/12/2022   BUN 6 02/12/2022   NA 131 (L) 02/12/2022   K 3.5 02/12/2022   CL 94 (L) 02/12/2022   CO2 28 02/12/2022    Lab Results  Component Value Date   ALT 20 02/10/2022   AST 18 02/10/2022   ALKPHOS 61 02/10/2022   BILITOT 0.6 02/10/2022      Microbiology: Recent Results (from the past 240 hour(s))  Blood Culture (routine x 2)     Status: Abnormal   Collection Time: 02/07/22  5:01 PM   Specimen: BLOOD RIGHT HAND  Result Value Ref Range Status   Specimen Description   Final    BLOOD RIGHT HAND Performed at [facility], [address] [place], [place] [address]    Special Requests   Final    BOTTLES DRAWN AEROBIC AND ANAEROBIC Blood Culture results may not be optimal due to an inadequate volume of blood received in culture bottles Performed at [facility], [address] [place], [place] [address]    Culture  Setup Time   Final    GRAM POSITIVE COCCI BOTTLES DRAWN AEROBIC AND ANAEROBIC Gram Stain Report Called to,Read Back By and Verified With: GUILBEAULT,D '@0745'$  BY MATTHEWS, B 8.16.2023 IN BOTH AEROBIC AND  ANAEROBIC BOTTLES    Culture (A)  Final    STAPHYLOCOCCUS AUREUS SUSCEPTIBILITIES PERFORMED ON PREVIOUS CULTURE WITHIN THE LAST 5 DAYS. Performed at [facility], [place] [address], [place], [place] [address]    Report Status 02/10/2022 FINAL  Final  Blood Culture (routine x 2)     Status: Abnormal   Collection Time: 02/07/22  5:01 PM   Specimen: BLOOD RIGHT ARM  Result Value Ref Range Status   Specimen Description   Final    BLOOD RIGHT ARM Performed at [facility], [address], [place], [place] [address]    Special Requests   Final    BOTTLES DRAWN AEROBIC AND ANAEROBIC Blood Culture results may  not be optimal due to an inadequate volume of blood received in culture bottles Performed at [facility], [address] [place], [place] [address]    Culture  Setup Time   Final    GRAM POSITIVE COCCI BOTTLES DRAWN AEROBIC AND ANAEROBIC Gram Stain Report Called to,Read Back By and Verified With: GUILBEAULT,D'@0745'$  BY MATTHEWS, B 8.16.2023 IN BOTH AEROBIC AND ANAEROBIC BOTTLES GRAM STAIN REVIEWED-AGREE WITH RESULT CRITICAL RESULT CALLED TO, READ BACK BY AND VERIFIED WITH: Noemi Chapel [id number] AT 1411 BY EC Performed at [facility], [place] [address] [place], [place] [address]    Culture METHICILLIN RESISTANT STAPHYLOCOCCUS AUREUS (A)  Final   Report Status 02/10/2022 FINAL  Final   Organism ID, Bacteria METHICILLIN RESISTANT STAPHYLOCOCCUS AUREUS  Final      Susceptibility   Methicillin resistant staphylococcus aureus - MIC*    CIPROFLOXACIN >=8 RESISTANT Resistant     ERYTHROMYCIN >=8 RESISTANT Resistant     GENTAMICIN <=0.5 SENSITIVE Sensitive     OXACILLIN >=4 RESISTANT Resistant     TETRACYCLINE >=16 RESISTANT Resistant     VANCOMYCIN <=0.5 SENSITIVE Sensitive     TRIMETH/SULFA >=320 RESISTANT Resistant     CLINDAMYCIN <=0.25 SENSITIVE Sensitive     RIFAMPIN <=0.5 SENSITIVE Sensitive     Inducible Clindamycin NEGATIVE Sensitive     * METHICILLIN RESISTANT  STAPHYLOCOCCUS AUREUS  Blood Culture ID Panel (Reflexed)     Status: Abnormal   Collection Time: 02/07/22  5:01 PM  Result Value Ref Range Status   Enterococcus faecalis NOT DETECTED NOT DETECTED Final   Enterococcus Faecium NOT DETECTED NOT DETECTED Final   Listeria monocytogenes NOT DETECTED NOT DETECTED Final   Staphylococcus species DETECTED (A) NOT DETECTED Final    Comment: CRITICAL RESULT CALLED TO, READ BACK BY AND VERIFIED WITH: PHARMD Alycia Rossetti [id number] AT 1411 BY EC    Staphylococcus aureus (BCID) DETECTED (A) NOT DETECTED Final    Comment: Methicillin (oxacillin)-resistant Staphylococcus aureus (MRSA). MRSA is predictably resistant to beta-lactam antibiotics (except ceftaroline). Preferred therapy is vancomycin unless clinically contraindicated. Patient requires contact precautions if  hospitalized. CRITICAL RESULT CALLED TO, READ BACK BY AND VERIFIED WITH: Solomon [id number] AT 2297 BY EC    Staphylococcus epidermidis NOT DETECTED NOT DETECTED Final   Staphylococcus lugdunensis NOT DETECTED NOT DETECTED Final   Streptococcus species NOT DETECTED NOT DETECTED Final   Streptococcus agalactiae NOT DETECTED NOT DETECTED Final   Streptococcus pneumoniae NOT DETECTED NOT DETECTED Final   Streptococcus pyogenes NOT DETECTED NOT DETECTED Final   A.calcoaceticus-baumannii NOT DETECTED NOT DETECTED Final   Bacteroides fragilis NOT DETECTED NOT DETECTED Final   Enterobacterales NOT DETECTED NOT DETECTED Final   Enterobacter cloacae complex NOT DETECTED NOT DETECTED Final   Escherichia coli NOT DETECTED NOT DETECTED Final   Klebsiella aerogenes NOT DETECTED NOT DETECTED Final   Klebsiella oxytoca NOT DETECTED NOT DETECTED Final   Klebsiella pneumoniae NOT DETECTED NOT DETECTED Final   Proteus species NOT DETECTED NOT DETECTED Final   Salmonella species NOT DETECTED NOT DETECTED Final   Serratia marcescens NOT DETECTED NOT DETECTED Final   Haemophilus influenzae  NOT DETECTED NOT DETECTED Final   Neisseria meningitidis NOT DETECTED NOT DETECTED Final   Pseudomonas aeruginosa NOT DETECTED NOT DETECTED Final   Stenotrophomonas maltophilia NOT DETECTED NOT DETECTED Final   Candida albicans NOT DETECTED NOT DETECTED Final   Candida auris NOT DETECTED NOT DETECTED Final   Candida glabrata NOT DETECTED NOT DETECTED Final  Candida krusei NOT DETECTED NOT DETECTED Final   Candida parapsilosis NOT DETECTED NOT DETECTED Final   Candida tropicalis NOT DETECTED NOT DETECTED Final   Cryptococcus neoformans/gattii NOT DETECTED NOT DETECTED Final   Meth resistant mecA/C and MREJ DETECTED (A) NOT DETECTED Final    Comment: CRITICAL RESULT CALLED TO, READ BACK BY AND VERIFIED WITH: Noemi Chapel [id number] AT 1411 BY EC Performed at [facility], [place] [address] [place], [place] [address]   Resp Panel by RT-PCR (Flu A&B, Covid) Anterior Nasal Swab     Status: None   Collection Time: 02/07/22  5:20 PM   Specimen: Anterior Nasal Swab  Result Value Ref Range Status   SARS Coronavirus 2 by RT PCR NEGATIVE NEGATIVE Final    Comment: (NOTE) SARS-CoV-2 target nucleic acids are NOT DETECTED.  The SARS-CoV-2 RNA is generally detectable in upper respiratory specimens during the acute phase of infection. The lowest concentration of SARS-CoV-2 viral copies this assay can detect is 138 copies/mL. A negative result does not preclude SARS-Cov-2 infection and should not be used as the sole basis for treatment or other patient management decisions. A negative result may occur with  improper specimen collection/handling, submission of specimen other than nasopharyngeal swab, presence of viral mutation(s) within the areas targeted by this assay, and inadequate number of viral copies(<138 copies/mL). A negative result must be combined with clinical observations, patient history, and epidemiological information. The expected result is Negative.  Fact Sheet for  Patients:  EntrepreneurPulse.com.au  Fact Sheet for Healthcare Providers:  IncredibleEmployment.be  This test is no t yet approved or cleared by the [place] FDA and  has been authorized for detection and/or diagnosis of SARS-CoV-2 by FDA under an Emergency Use Authorization (EUA). This EUA will remain  in effect (meaning this test can be used) for the duration of the COVID-19 declaration under Section 564(b)(1) of the Act, 21 U.S.C.section 360bbb-3(b)(1), unless the authorization is terminated  or revoked sooner.       Influenza A by PCR NEGATIVE NEGATIVE Final   Influenza B by PCR NEGATIVE NEGATIVE Final    Comment: (NOTE) The Xpert Xpress SARS-CoV-2/FLU/RSV plus assay is intended as an aid in the diagnosis of influenza from Nasopharyngeal swab specimens and should not be used as a sole basis for treatment. Nasal washings and aspirates are unacceptable for Xpert Xpress SARS-CoV-2/FLU/RSV testing.  Fact Sheet for Patients: EntrepreneurPulse.com.au  Fact Sheet for Healthcare Providers: IncredibleEmployment.be  This test is not yet approved or cleared by the [place] FDA and has been authorized for detection and/or diagnosis of SARS-CoV-2 by FDA under an Emergency Use Authorization (EUA). This EUA will remain in effect (meaning this test can be used) for the duration of the COVID-19 declaration under Section 564(b)(1) of the Act, 21 U.S.C. section 360bbb-3(b)(1), unless the authorization is terminated or revoked.  Performed at [facility], [address] [place], [place] [address]   Urine Culture     Status: None   Collection Time: 02/07/22 11:32 PM   Specimen: In/Out Cath Urine  Result Value Ref Range Status   Specimen Description IN/OUT CATH URINE  Final   Special Requests NONE  Final   Culture   Final    NO GROWTH Performed at [facility], [place] [address] [place], [place]  [address]    Report Status 02/08/2022 FINAL  Final  Culture, blood (Routine X 2) w Reflex to ID Panel     Status: None (Preliminary result)  Collection Time: 02/09/22  4:32 AM   Specimen: BLOOD RIGHT HAND  Result Value Ref Range Status   Specimen Description BLOOD RIGHT HAND  Final   Special Requests   Final    AEROBIC BOTTLE ONLY Blood Culture results may not be optimal due to an excessive volume of blood received in culture bottles   Culture   Final    NO GROWTH 4 DAYS Performed at [facility], [place] [address] [place], [place] [address]    Report Status PENDING  Incomplete  Culture, blood (Routine X 2) w Reflex to ID Panel     Status: None (Preliminary result)   Collection Time: 02/09/22  4:32 AM   Specimen: BLOOD LEFT HAND  Result Value Ref Range Status   Specimen Description BLOOD LEFT HAND  Final   Special Requests   Final    AEROBIC BOTTLE ONLY Blood Culture results may not be optimal due to an excessive volume of blood received in culture bottles   Culture   Final    NO GROWTH 4 DAYS Performed at [facility], [place] [address], [place], [place] [address]    Report Status PENDING  Incomplete  Culture, blood (Routine X 2) w Reflex to ID Panel     Status: None (Preliminary result)   Collection Time: 02/11/22  3:49 AM   Specimen: BLOOD LEFT HAND  Result Value Ref Range Status   Specimen Description BLOOD LEFT HAND  Final   Special Requests   Final    BOTTLES DRAWN AEROBIC ONLY Blood Culture results may not be optimal due to an inadequate volume of blood received in culture bottles   Culture   Final    NO GROWTH 2 DAYS Performed at [facility], [place] [address], [place], [place] [address]    Report Status PENDING  Incomplete  Culture, blood (Routine X 2) w Reflex to ID Panel     Status: None (Preliminary result)   Collection Time: 02/11/22  3:57 AM   Specimen: BLOOD RIGHT HAND  Result Value Ref Range Status   Specimen Description BLOOD RIGHT HAND  Final    Special Requests   Final    BOTTLES DRAWN AEROBIC ONLY Blood Culture results may not be optimal due to an inadequate volume of blood received in culture bottles   Culture   Final    NO GROWTH 2 DAYS Performed at [facility], [place] [address] [place], [place] [address]    Report Status PENDING  Incomplete  Body fluid culture w Gram Stain     Status: None (Preliminary result)   Collection Time: 02/12/22  4:46 PM   Specimen: Pleural Fluid  Result Value Ref Range Status   Specimen Description FLUID  Final   Special Requests Immunocompromised  Final   Gram Stain NO WBC SEEN NO ORGANISMS SEEN   Final   Culture   Final    NO GROWTH < 24 HOURS Performed at [facility], [place] [address], [place],  [address]    Report Status PENDING  Incomplete     Serology:   Imaging: If present, new imagings (plain films, ct scans, and mri) have been personally visualized and interpreted; radiology reports have been reviewed. Decision making incorporated into the Impression / Recommendations.  8/16 tte  1. Left ventricular ejection fraction, by estimation, is 65 to 70%. The  left ventricle has normal function. The left ventricle has no regional  wall motion abnormalities. Left  ventricular diastolic parameters were  normal.   2. Right ventricular systolic function is normal. The right ventricular  size is normal.   3. The mitral valve is normal in structure. No evidence of mitral valve  regurgitation. No evidence of mitral stenosis.   4. The aortic valve has an indeterminant number of cusps. Aortic valve  regurgitation is not visualized. No aortic stenosis is present.   5. The inferior vena cava is normal in size with greater than 50%  respiratory variability, suggesting right atrial pressure of 3 mmHg.    8/16 mri left humerus Soft tissue Severe circumferential soft tissue swelling and enhancement of the upper arm. There is a small 1.5 x 1.0 x 1.6 cm rim enhancing  fluid collection in the medial proximal upper arm just deep to the skin surface (series 11, image 20). There is a second slightly smaller 1.5 x 0.6 x 1.2 cm rim enhancing fluid collection in the lateral chest wall/axilla (series 11, image 21).   Multifocal patchy airspace disease in the left lung.   IMPRESSION: 1. Severe cellulitis of the upper arm, with two small 1.5 cm superficial abscesses in the medial proximal upper arm and lateral chest wall/axilla.   8/15 ct chest with contrast 1. Innumerable pulmonary nodules scattered throughout both lungs, several of which are cavitated. Differential considerations include septic pulmonary emboli as well as atypical bacterial or fungal pneumonia given history of HIV. 2. Asymmetric nodular skin thickening and subcutaneous edema in the left axilla, concerning for cellulitis. No discrete abscess. 3. Trace bilateral pleural effusions.    Jabier Mutton, [place] [facility] [place] [phone] pager    02/13/2022, 2:35 PM

## 2022-02-13 NOTE — Progress Notes (Signed)
   02/12/22 2310  Assess: MEWS Score  Temp 99.6 F (37.6 C)  BP (!) 110/51  Pulse Rate (!) 116 (pt was in pain again, gave pain med and sleeping med)  Resp (!) 22  SpO2 95 %  O2 Device Nasal Cannula  O2 Flow Rate (L/min) 4 L/min  Assess: MEWS Score  MEWS Temp 0  MEWS Systolic 0  MEWS Pulse 2  MEWS RR 1  MEWS LOC 0  MEWS Score 3  MEWS Score Color Yellow  Assess: if the MEWS score is Yellow or Red  Were vital signs taken at a resting state? Yes  Focused Assessment No change from prior assessment (pt in pain again, gave pain meds and sleeping meds)  Document  Patient Outcome Stabilized after interventions  Progress note created (see row info) Yes  Assess: SIRS CRITERIA  SIRS Temperature  0  SIRS Pulse 1  SIRS Respirations  1  SIRS WBC 1  SIRS Score Sum  3

## 2022-02-14 ENCOUNTER — Inpatient Hospital Stay (HOSPITAL_COMMUNITY): Payer: Self-pay

## 2022-02-14 ENCOUNTER — Other Ambulatory Visit (HOSPITAL_COMMUNITY): Payer: Self-pay

## 2022-02-14 ENCOUNTER — Telehealth (HOSPITAL_COMMUNITY): Payer: Self-pay | Admitting: Pharmacy Technician

## 2022-02-14 DIAGNOSIS — J90 Pleural effusion, not elsewhere classified: Secondary | ICD-10-CM

## 2022-02-14 LAB — CBC
HCT: 31.2 % — ABNORMAL LOW (ref 39.0–52.0)
Hemoglobin: 10.4 g/dL — ABNORMAL LOW (ref 13.0–17.0)
MCH: 26.9 pg (ref 26.0–34.0)
MCHC: 33.3 g/dL (ref 30.0–36.0)
MCV: 80.8 fL (ref 80.0–100.0)
Platelets: 391 10*3/uL (ref 150–400)
RBC: 3.86 MIL/uL — ABNORMAL LOW (ref 4.22–5.81)
RDW: 16.6 % — ABNORMAL HIGH (ref 11.5–15.5)
WBC: 10.6 10*3/uL — ABNORMAL HIGH (ref 4.0–10.5)
nRBC: 0 % (ref 0.0–0.2)

## 2022-02-14 LAB — BASIC METABOLIC PANEL
Anion gap: 9 (ref 5–15)
BUN: 10 mg/dL (ref 6–20)
CO2: 24 mmol/L (ref 22–32)
Calcium: 7.8 mg/dL — ABNORMAL LOW (ref 8.9–10.3)
Chloride: 99 mmol/L (ref 98–111)
Creatinine, Ser: 0.78 mg/dL (ref 0.61–1.24)
GFR, Estimated: >60 mL/min (ref >60)
Glucose, Bld: 103 mg/dL — ABNORMAL HIGH (ref 70–99)
Potassium: 4.3 mmol/L (ref 3.5–5.1)
Sodium: 132 mmol/L — ABNORMAL LOW (ref 135–145)

## 2022-02-14 LAB — MAGNESIUM: Magnesium: 1.8 mg/dL (ref 1.7–2.4)

## 2022-02-14 LAB — CULTURE, BLOOD (ROUTINE X 2)
Culture: NO GROWTH
Culture: NO GROWTH

## 2022-02-14 LAB — TROPONIN I (HIGH SENSITIVITY): Troponin I (High Sensitivity): 5 ng/L (ref <18)

## 2022-02-14 LAB — CYTOLOGY - NON PAP

## 2022-02-14 LAB — MRSA NEXT GEN BY PCR, NASAL: MRSA by PCR Next Gen: DETECTED — AB

## 2022-02-14 MED ORDER — ALTEPLASE 2 MG IJ SOLR
10.0000 mg | Freq: Once | INTRAMUSCULAR | Status: AC
  Administered 2022-02-14: 10 mg via INTRAPLEURAL
  Filled 2022-02-14: qty 10

## 2022-02-14 MED ORDER — IBUPROFEN 600 MG PO TABS: 600.0000 mg | ORAL_TABLET | Freq: Two times a day (BID) | ORAL | Status: DC | PRN

## 2022-02-14 MED ORDER — DORNASE ALFA 2.5 MG/2.5ML IN SOLN
5.0000 mg | Freq: Once | RESPIRATORY_TRACT | Status: AC
Start: 2022-02-14 — End: 2022-02-14
  Administered 2022-02-14: 5 mg via INTRAPLEURAL
  Filled 2022-02-14: qty 5

## 2022-02-14 MED ORDER — CHLORHEXIDINE GLUCONATE CLOTH 2 % EX PADS
6.0000 | MEDICATED_PAD | Freq: Every day | CUTANEOUS | Status: DC
  Administered 2022-02-14 – 2022-02-18 (×5): 6 via TOPICAL

## 2022-02-14 MED ORDER — KETOROLAC TROMETHAMINE 15 MG/ML IJ SOLN
15.0000 mg | Freq: Four times a day (QID) | INTRAMUSCULAR | Status: AC
Start: 2022-02-14 — End: 2022-02-19
  Administered 2022-02-14 – 2022-02-19 (×20): 15 mg via INTRAVENOUS
  Filled 2022-02-14 (×22): qty 1

## 2022-02-14 MED ORDER — SODIUM CHLORIDE 0.9 % IV SOLN: INTRAVENOUS | Status: DC

## 2022-02-14 MED ORDER — SODIUM CHLORIDE 0.9% FLUSH
10.0000 mL | Freq: Three times a day (TID) | INTRAVENOUS | Status: DC
  Administered 2022-02-14 – 2022-02-19 (×14): 10 mL via INTRAPLEURAL

## 2022-02-14 MED ORDER — HYDROMORPHONE HCL 1 MG/ML IJ SOLN
0.5000 mg | INTRAMUSCULAR | Status: AC | PRN
  Administered 2022-02-15 – 2022-02-17 (×6): 1 mg via INTRAVENOUS
  Filled 2022-02-14 (×7): qty 1

## 2022-02-14 MED ORDER — HALOPERIDOL LACTATE 5 MG/ML IJ SOLN
5.0000 mg | Freq: Once | INTRAMUSCULAR | Status: AC
  Administered 2022-02-14: 5 mg via INTRAVENOUS
  Filled 2022-02-14: qty 1

## 2022-02-14 NOTE — Progress Notes (Signed)
   NAME:  Rick Lam, MRN:  [id number], DOB:  05-08-1991, LOS: 7 ADMISSION DATE:  02/07/2022, CONSULTATION DATE:  02/07/2022 REFERRING MD:  Roderic Palau, CHIEF COMPLAINT:  fever, chest discomfort, weakness   History of Present Illness:  Rick Lam is a 31 year old male with HIV/AIDS on biktarvy, chronic hepatitis C, latent syphillis s/p treatment who presented with painful left axillary nodules with erythema, found to have sepsis with MRSA bacteremia along with axillary abscess, cellulitis and septic pulmonary emboli.   TEE 8/18 negative for vegetations. ID is following and patient has been on vancomycin. PCCM consulted due to increasing respiratory distress and new right pleural effusion.  Pertinent Medical History:  HIV/AIDS [place] Late latent syphilis, treated Chronic HCV  Significant Hospital Events: Including procedures, antibiotic start and stop dates in addition to other pertinent events   02/06/22 transfer from [facility] to ED, started on vanc/cefepime 8/15 - CT Chest with innumerable pulmonary nodules bilaterally, several cavitated; asymmetrical skin thickening/SQ edema in L axilla c/f cellulitis. PCCM consulted. 8/16 - TTE with EF 65-70%, LE dopplers negative for DVT. MRI L humerus with severe upper arm cellulitis, two small 1.5cm abscesses. ID consulted 8/17 - Ortho consult for cellulitis, conservative management with IV abx. BCx positive for MRSA, vanc-sensitive. 8/18 - TEE with Cards, r/o vegetation. Persistent pain. Cellulitis/erythema improving. 8/20 - chest tube placed  8/21 CT of the chest  Interim History / Subjective:  Complains of pain  Objective:  Blood pressure 109/62, pulse (!) 101, temperature 98.1 F (36.7 C), temperature source Oral, resp. rate 16, height 6' (1.829 m), weight 76.1 kg, SpO2 97 %.        Intake/Output Summary (Last 24 hours) at 02/14/2022 0941 Last data filed at 02/14/2022 0758 Gross per 24 hour  Intake 120 ml  Output 705 ml  Net -585 ml   Filed  Weights   02/12/22 0424 02/13/22 0428 02/14/22 0123  Weight: 76 kg 78.3 kg 76.1 kg    Physical Examination: General: Young male complains of pain HEENT: MM pink/moist no JVD or lymphadenopathy is appreciated Neuro: Grossly intact without focal defect CV: Heart sounds are regular PULM: Diminished throughout Right chest tube with scant drainage GI: soft, bsx4 active  GU: Voids Extremities: warm/dry, negative edema , axillary regions remain erythemic   Assessment & Plan:   Sepsis due to MRSA Bacteremia, septic pulmonary emboli and left arm abscess/cellulitis Acute Hypoxemic Respiratory Failure Right Pleural Effusion HIV/AIDS  Plan: CT of the chest 02/13/2022 reveals enlarging pleural effusion despite right Increased size and nodularity consistent with infectious Continue chest tube Continue antibiotics per infectious disease May need fiberoptic bronchoscopy in future Questionable role for lytics    Richardson Landry Dylan Monforte ACNP Acute Care Nurse Practitioner [place] Please consult [place] 02/14/2022, 9:42 AM

## 2022-02-14 NOTE — Progress Notes (Signed)
CCM interval progress note   Seen in follow up this afternoon after onset of respiratory distress. Complicated pleural effusion, septic emboli, cellulitis, MRSA bacteremia.   1hr ago had abrupt onset pain, SOB. Some chest pain but on further interview this is more localized to chest tube site. Some intermittent dizziness.   On arrival pt spo2 90 on 4L, tachy HR 130s, SBP 100, significant tachypnea -- shallow respirations, accessory muscle use.  Chest tube with about 58m out since tpa/dornase this afternoon  CXR looks like maybe there is a small kink in pigtail. Flushes very well however. No ptx. Not sure that kink would explain pain + VS changes   ?if this might be further septic emboli   Given '1mg'$  dilaudid with [place] respirations improved a little and his sats improved to 92   Plan Transfer to ICU Supportive O2  Multimodal analgesia -- incr fq of PRN dilaudid to q3hr, adding q6hr PRN toradol, keeping PRN oxy as is.  Might need further pain optimization  Will send trop for completeness, get ecg     GEliseo GumMSN, AGACNP-BC [place]8/22/2023, 6:20 PM

## 2022-02-14 NOTE — Progress Notes (Signed)
PROGRESS NOTE    Rick Lam  QMG:[id number] DOB: April 09, 1991 DOA: 02/07/2022 PCP: Jabier Mutton, MD   Brief Narrative:  31 year old with past medical history significant for HIV/AIDS, recent CD4 78, chronic hepatitis C, latent syphilis that has been treated previously.  Presented with painful left axillary nodules with surrounding erythema he was admitted with sepsis, found to have MRSA bacteremia secondary to axillary abscess and cellulitis.  He underwent TEE 8/18 which was negative for endocarditis.  ID & Pulm are following.  Eventually chest tube was placed on 8/20.  Repeat CT without contrast showed enlarging nodularityAnd right-sided pleural effusion.   Assessment & Plan:  Principal Problem:   Severe sepsis ([place]) Active Problems:   AIDS (acquired immune deficiency syndrome) (HCC)   Oral thrush   Hyponatremia   Acute respiratory failure with hypoxia (HCC)   Acute septic pulmonary embolism without acute cor pulmonale (HCC)   Cutaneous abscess of left upper extremity   Sepsis secondary to MRSA bacteremia,  bilateral likely pulmonary septic emboli, Left arm cellulitis and abscess:  -Immunocompromise state, HIV/AIDS.  Initial cultures on 8/15 positive for MRSA but repeat cultures have remained negative.  MRI left upper extremity showed small abscess but Ortho did not recommend drainage.  TEE negative for endocarditis - [place] following.  Chest tube placed 8/20. Repeat CT chest without contrast 8/21 showed worsening nodularity and right-sided pleural effusion. May need tPA vs Decortication by CT Sx.  -Left axillary abscess, general surgery recs warm compresses, IV antibiotics, no drainage -QuantiFERON-negative - Antibiotics-IV vancomycin   Acute hypoxic respiratory failure: Bilateral pulmonary nodules, concern with septic emboli -PCCM consulted due to worsening respiratory distress secondary to right-sided pleural effusion concerning for empyema.  Chest tube placed 8/20 I-S/flutter  valve, as needed bronchodilators   HIV/AIDS: ID recommending Biktarvy/Bactrim for prophylaxis - Continue IV vancomycin   Hyponatremia: Improved continue to monitor   AKI: - Resolved.  Creatinine now at baseline of 0.6  Oral thrush; odynophagia; - P.o. Diflucan, total 14 days   Estimated body mass index is 22.72 kg/m as calculated from the following:   Height as of this encounter: 6' (1.829 m).   Weight as of this encounter: 76 kg.     DVT prophylaxis: Lovenox Code Status: Full code Family Communication: Disposition Plan:  Status is: Inpatient Remains inpatient appropriate because: Continue with management of MRSA bacteremia, axillary abscess, bilateral pulmonary septic emboli.    Anti-infectives: IV vancomycin Prophylaxis Bactrim Biktarvy   Subjective: Patient spiking fever overnight leading to tachycardia No other complaints.   Examination: Constitutional: Not in acute distress Respiratory: Diminished breath sounds especially at bases Cardiovascular: Normal sinus rhythm, no rubs Abdomen: Nontender nondistended good bowel sounds Musculoskeletal: No edema noted Skin: No rashes seen Neurologic: CN 2-12 grossly intact.  And nonfocal Psychiatric: Normal judgment and insight. Alert and oriented x 3. Normal mood.   Chest tube currently in place  Objective: Vitals:   02/14/22 0427 02/14/22 0502 02/14/22 0600 02/14/22 0658  BP:    (!) 101/59  Pulse: (!) 111 (!) 105 (!) 109 (!) 103  Resp: '14 14 20 16  '$ Temp:    (!) 101.1 F (38.4 C)  TempSrc:    Axillary  SpO2: 95% 96% 96% 96%  Weight:      Height:        Intake/Output Summary (Last 24 hours) at 02/14/2022 0716 Last data filed at 02/14/2022 0100 Gross per 24 hour  Intake 120 ml  Output 205 ml  Net -85 ml  Filed Weights   02/12/22 0424 02/13/22 0428 02/14/22 0123  Weight: 76 kg 78.3 kg 76.1 kg     Data Reviewed:   CBC: Recent Labs  Lab 02/07/22 1701 02/08/22 0410 02/09/22 0432 02/10/22 0746  02/11/22 0341 02/12/22 0449 02/14/22 0459  WBC 12.2* 7.6 8.6 10.6* 12.1* 9.6 10.6*  NEUTROABS 11.2* 6.5 6.8 8.4* 8.7*  --   --   HGB 14.2 11.4* 11.0* 10.8* 11.5* 10.2* 10.4*  HCT 43.7 33.9* 32.3* 32.8* 34.3* 29.8* 31.2*  MCV 84.5 82.9 80.5 83.0 81.9 80.3 80.8  PLT 156 104* 134* 148* 185 238 812   Basic Metabolic Panel: Recent Labs  Lab 02/09/22 0432 02/10/22 0746 02/11/22 0341 02/12/22 0449 02/14/22 0459  NA 128* 129* 131* 131* 132*  K 3.7 3.6 3.6 3.5 4.3  CL 97* 95* 92* 94* 99  CO2 '24 26 28 28 24  '$ GLUCOSE 102* 158* 108* 107* 103*  BUN 9 6 5* 6 10  CREATININE 0.80 0.65 0.57* 0.65 0.78  CALCIUM 7.7* 7.7* 7.8* 7.4* 7.8*  MG  --   --  1.9  --  1.8   GFR: Estimated Creatinine Clearance: 141.4 mL/min (by C-G formula based on SCr of 0.78 mg/dL). Liver Function Tests: Recent Labs  Lab 02/07/22 1701 02/08/22 0410 02/09/22 0432 02/10/22 0746  AST 55* 37 28 18  ALT 44 '30 27 20  '$ ALKPHOS 82 59 63 61  BILITOT 0.9 0.7 0.8 0.6  PROT 8.0 5.4* 5.6* 5.4*  ALBUMIN 3.3* 2.1* 2.1* 2.0*   No results for input(s): "LIPASE", "AMYLASE" in the last 168 hours. No results for input(s): "AMMONIA" in the last 168 hours. Coagulation Profile: Recent Labs  Lab 02/07/22 1821  INR 1.3*   Cardiac Enzymes: No results for input(s): "CKTOTAL", "CKMB", "CKMBINDEX", "TROPONINI" in the last 168 hours. BNP (last 3 results) No results for input(s): "PROBNP" in the last 8760 hours. HbA1C: No results for input(s): "HGBA1C" in the last 72 hours. CBG: Recent Labs  Lab 02/11/22 1756  GLUCAP 158*   Lipid Profile: No results for input(s): "CHOL", "HDL", "LDLCALC", "TRIG", "CHOLHDL", "LDLDIRECT" in the last 72 hours. Thyroid Function Tests: No results for input(s): "TSH", "T4TOTAL", "FREET4", "T3FREE", "THYROIDAB" in the last 72 hours.  Anemia Panel: No results for input(s): "VITAMINB12", "FOLATE", "FERRITIN", "TIBC", "IRON", "RETICCTPCT" in the last 72 hours. Sepsis Labs: Recent Labs  Lab  02/07/22 1701 02/07/22 1901  LATICACIDVEN 4.1* 1.3    Recent Results (from the past 240 hour(s))  Blood Culture (routine x 2)     Status: Abnormal   Collection Time: 02/07/22  5:01 PM   Specimen: BLOOD RIGHT HAND  Result Value Ref Range Status   Specimen Description   Final    BLOOD RIGHT HAND Performed at [facility], [address] [place], [place] [address]    Special Requests   Final    BOTTLES DRAWN AEROBIC AND ANAEROBIC Blood Culture results may not be optimal due to an inadequate volume of blood received in culture bottles Performed at [facility], [address], [place], [place] [address]    Culture  Setup Time   Final    GRAM POSITIVE COCCI BOTTLES DRAWN AEROBIC AND ANAEROBIC Gram Stain Report Called to,Read Back By and Verified With: GUILBEAULT,D '@0745'$  BY MATTHEWS, B 8.16.2023 IN BOTH AEROBIC AND ANAEROBIC BOTTLES    Culture (A)  Final    STAPHYLOCOCCUS AUREUS SUSCEPTIBILITIES PERFORMED ON PREVIOUS CULTURE WITHIN THE LAST 5 DAYS. Performed at [facility], [place]  [address], [place], [place] [address]    Report Status 02/10/2022 FINAL  Final  Blood Culture (routine x 2)     Status: Abnormal   Collection Time: 02/07/22  5:01 PM   Specimen: BLOOD RIGHT ARM  Result Value Ref Range Status   Specimen Description   Final    BLOOD RIGHT ARM Performed at [facility], [address] [place], [place] [address]    Special Requests   Final    BOTTLES DRAWN AEROBIC AND ANAEROBIC Blood Culture results may not be optimal due to an inadequate volume of blood received in culture bottles Performed at [place] [place], [place] [address]    Culture  Setup Time   Final    GRAM POSITIVE COCCI BOTTLES DRAWN AEROBIC AND ANAEROBIC Gram Stain Report Called to,Read Back By and Verified With: GUILBEAULT,D'@0745'$  BY MATTHEWS, B 8.16.2023 IN BOTH AEROBIC AND ANAEROBIC BOTTLES GRAM STAIN REVIEWED-AGREE WITH RESULT CRITICAL RESULT CALLED TO, READ BACK BY AND VERIFIED  WITH: Noemi Chapel [id number] AT 1411 BY EC Performed at [facility], [place] [address], [place], [place] [address]    Culture METHICILLIN RESISTANT STAPHYLOCOCCUS AUREUS (A)  Final   Report Status 02/10/2022 FINAL  Final   Organism ID, Bacteria METHICILLIN RESISTANT STAPHYLOCOCCUS AUREUS  Final      Susceptibility   Methicillin resistant staphylococcus aureus - MIC*    CIPROFLOXACIN >=8 RESISTANT Resistant     ERYTHROMYCIN >=8 RESISTANT Resistant     GENTAMICIN <=0.5 SENSITIVE Sensitive     OXACILLIN >=4 RESISTANT Resistant     TETRACYCLINE >=16 RESISTANT Resistant     VANCOMYCIN <=0.5 SENSITIVE Sensitive     TRIMETH/SULFA >=320 RESISTANT Resistant     CLINDAMYCIN <=0.25 SENSITIVE Sensitive     RIFAMPIN <=0.5 SENSITIVE Sensitive     Inducible Clindamycin NEGATIVE Sensitive     * METHICILLIN RESISTANT STAPHYLOCOCCUS AUREUS  Blood Culture ID Panel (Reflexed)     Status: Abnormal   Collection Time: 02/07/22  5:01 PM  Result Value Ref Range Status   Enterococcus faecalis NOT DETECTED NOT DETECTED Final   Enterococcus Faecium NOT DETECTED NOT DETECTED Final   Listeria monocytogenes NOT DETECTED NOT DETECTED Final   Staphylococcus species DETECTED (A) NOT DETECTED Final    Comment: CRITICAL RESULT CALLED TO, READ BACK BY AND VERIFIED WITH: PHARMD Alycia Rossetti [id number] AT 1411 BY EC    Staphylococcus aureus (BCID) DETECTED (A) NOT DETECTED Final    Comment: Methicillin (oxacillin)-resistant Staphylococcus aureus (MRSA). MRSA is predictably resistant to beta-lactam antibiotics (except ceftaroline). Preferred therapy is vancomycin unless clinically contraindicated. Patient requires contact precautions if  hospitalized. CRITICAL RESULT CALLED TO, READ BACK BY AND VERIFIED WITH: [place] [id number] AT 0263 BY EC    Staphylococcus epidermidis NOT DETECTED NOT DETECTED Final   Staphylococcus lugdunensis NOT DETECTED NOT DETECTED Final   Streptococcus species NOT  DETECTED NOT DETECTED Final   Streptococcus agalactiae NOT DETECTED NOT DETECTED Final   Streptococcus pneumoniae NOT DETECTED NOT DETECTED Final   Streptococcus pyogenes NOT DETECTED NOT DETECTED Final   A.calcoaceticus-baumannii NOT DETECTED NOT DETECTED Final   Bacteroides fragilis NOT DETECTED NOT DETECTED Final   Enterobacterales NOT DETECTED NOT DETECTED Final   Enterobacter cloacae complex NOT DETECTED NOT DETECTED Final   Escherichia coli NOT DETECTED NOT DETECTED Final   Klebsiella aerogenes NOT DETECTED NOT DETECTED Final   Klebsiella oxytoca NOT DETECTED NOT DETECTED Final   Klebsiella pneumoniae NOT DETECTED NOT DETECTED Final   Proteus  species NOT DETECTED NOT DETECTED Final   Salmonella species NOT DETECTED NOT DETECTED Final   Serratia marcescens NOT DETECTED NOT DETECTED Final   Haemophilus influenzae NOT DETECTED NOT DETECTED Final   Neisseria meningitidis NOT DETECTED NOT DETECTED Final   Pseudomonas aeruginosa NOT DETECTED NOT DETECTED Final   Stenotrophomonas maltophilia NOT DETECTED NOT DETECTED Final   Candida albicans NOT DETECTED NOT DETECTED Final   Candida auris NOT DETECTED NOT DETECTED Final   Candida glabrata NOT DETECTED NOT DETECTED Final   Candida krusei NOT DETECTED NOT DETECTED Final   Candida parapsilosis NOT DETECTED NOT DETECTED Final   Candida tropicalis NOT DETECTED NOT DETECTED Final   Cryptococcus neoformans/gattii NOT DETECTED NOT DETECTED Final   Meth resistant mecA/C and MREJ DETECTED (A) NOT DETECTED Final    Comment: CRITICAL RESULT CALLED TO, READ BACK BY AND VERIFIED WITH: Noemi Chapel [id number] AT 1411 BY EC Performed at [facility], [address], [place], [place] [address]   Resp Panel by RT-PCR (Flu A&B, Covid) Anterior Nasal Swab     Status: None   Collection Time: 02/07/22  5:20 PM   Specimen: Anterior Nasal Swab  Result Value Ref Range Status   SARS Coronavirus 2 by RT PCR NEGATIVE NEGATIVE Final    Comment:  (NOTE) SARS-CoV-2 target nucleic acids are NOT DETECTED.  The SARS-CoV-2 RNA is generally detectable in upper respiratory specimens during the acute phase of infection. The lowest concentration of SARS-CoV-2 viral copies this assay can detect is 138 copies/mL. A negative result does not preclude SARS-Cov-2 infection and should not be used as the sole basis for treatment or other patient management decisions. A negative result may occur with  improper specimen collection/handling, submission of specimen other than nasopharyngeal swab, presence of viral mutation(s) within the areas targeted by this assay, and inadequate number of viral copies(<138 copies/mL). A negative result must be combined with clinical observations, patient history, and epidemiological information. The expected result is Negative.  Fact Sheet for Patients:  EntrepreneurPulse.com.au  Fact Sheet for Healthcare Providers:  IncredibleEmployment.be  This test is no t yet approved or cleared by the [place] FDA and  has been authorized for detection and/or diagnosis of SARS-CoV-2 by FDA under an Emergency Use Authorization (EUA). This EUA will remain  in effect (meaning this test can be used) for the duration of the COVID-19 declaration under Section 564(b)(1) of the Act, 21 U.S.C.section 360bbb-3(b)(1), unless the authorization is terminated  or revoked sooner.       Influenza A by PCR NEGATIVE NEGATIVE Final   Influenza B by PCR NEGATIVE NEGATIVE Final    Comment: (NOTE) The Xpert Xpress SARS-CoV-2/FLU/RSV plus assay is intended as an aid in the diagnosis of influenza from Nasopharyngeal swab specimens and should not be used as a sole basis for treatment. Nasal washings and aspirates are unacceptable for Xpert Xpress SARS-CoV-2/FLU/RSV testing.  Fact Sheet for Patients: EntrepreneurPulse.com.au  Fact Sheet for Healthcare  Providers: IncredibleEmployment.be  This test is not yet approved or cleared by the [place] FDA and has been authorized for detection and/or diagnosis of SARS-CoV-2 by FDA under an Emergency Use Authorization (EUA). This EUA will remain in effect (meaning this test can be used) for the duration of the COVID-19 declaration under Section 564(b)(1) of the Act, 21 U.S.C. section 360bbb-3(b)(1), unless the authorization is terminated or revoked.  Performed at [facility], [address], [place], [place] [address]   Urine Culture     Status:  None   Collection Time: 02/07/22 11:32 PM   Specimen: In/Out Cath Urine  Result Value Ref Range Status   Specimen Description IN/OUT CATH URINE  Final   Special Requests NONE  Final   Culture   Final    NO GROWTH Performed at [facility], [place] [address], [place], [place] [address]    Report Status 02/08/2022 FINAL  Final  Culture, blood (Routine X 2) w Reflex to ID Panel     Status: None (Preliminary result)   Collection Time: 02/09/22  4:32 AM   Specimen: BLOOD RIGHT HAND  Result Value Ref Range Status   Specimen Description BLOOD RIGHT HAND  Final   Special Requests   Final    AEROBIC BOTTLE ONLY Blood Culture results may not be optimal due to an excessive volume of blood received in culture bottles   Culture   Final    NO GROWTH 4 DAYS Performed at [facility], [place] [address] [place], [place] [address]    Report Status PENDING  Incomplete  Culture, blood (Routine X 2) w Reflex to ID Panel     Status: None (Preliminary result)   Collection Time: 02/09/22  4:32 AM   Specimen: BLOOD LEFT HAND  Result Value Ref Range Status   Specimen Description BLOOD LEFT HAND  Final   Special Requests   Final    AEROBIC BOTTLE ONLY Blood Culture results may not be optimal due to an excessive volume of blood received in culture bottles   Culture   Final    NO GROWTH 4 DAYS Performed at [facility], [place] [address] [place], [place] [address]    Report Status PENDING  Incomplete  Culture, blood (Routine X 2) w Reflex to ID Panel     Status: None (Preliminary result)   Collection Time: 02/11/22  3:49 AM   Specimen: BLOOD LEFT HAND  Result Value Ref Range Status   Specimen Description BLOOD LEFT HAND  Final   Special Requests   Final    BOTTLES DRAWN AEROBIC ONLY Blood Culture results may not be optimal due to an inadequate volume of blood received in culture bottles   Culture   Final    NO GROWTH 2 DAYS Performed at [facility], [place] [address] [place], [place] [address]    Report Status PENDING  Incomplete  Culture, blood (Routine X 2) w Reflex to ID Panel     Status: None (Preliminary result)   Collection Time: 02/11/22  3:57 AM   Specimen: BLOOD RIGHT HAND  Result Value Ref Range Status   Specimen Description BLOOD RIGHT HAND  Final   Special Requests   Final    BOTTLES DRAWN AEROBIC ONLY Blood Culture results may not be optimal due to an inadequate volume of blood received in culture bottles   Culture   Final    NO GROWTH 2 DAYS Performed at [facility], [place] [address] [place], [place] [address]    Report Status PENDING  Incomplete  Body fluid culture w Gram Stain     Status: None (Preliminary result)   Collection Time: 02/12/22  4:46 PM   Specimen: Pleural Fluid  Result Value Ref Range Status   Specimen Description FLUID  Final   Special Requests Immunocompromised  Final   Gram Stain NO WBC SEEN NO ORGANISMS SEEN   Final   Culture   Final    NO GROWTH < 24 HOURS Performed  at [facility], [place] [address] [place], [place] [address]    Report Status PENDING  Incomplete         Radiology Studies: CT CHEST WO CONTRAST  Result Date: 02/13/2022 CLINICAL DATA:  Pneumonia, complications suspected. History of chronic hepatitis C and AIDS. EXAM: CT CHEST WITHOUT CONTRAST TECHNIQUE: Multidetector CT imaging of the chest was performed following the standard  protocol without IV contrast. RADIATION DOSE REDUCTION: This exam was performed according to the departmental dose-optimization program which includes automated exposure control, adjustment of the mA and/or kV according to patient size and/or use of iterative reconstruction technique. COMPARISON:  Chest CT 02/07/2022 and 06/17/2021. Prior chest radiographs, most recently done yesterday. FINDINGS: Cardiovascular: No significant vascular findings on noncontrast imaging. Trace pericardial fluid. The heart size is normal. Mediastinum/Nodes: Numerous residual small mediastinal, hilar and axillary lymph nodes are similar to previous study and although suboptimally evaluated without contrast, are likely reactive. The thyroid gland, trachea and esophagus demonstrate no significant findings. Lungs/Pleura: Interval enlargement of right greater than left pleural effusions. A small caliber pigtail catheter has been placed inferolaterally in the right pleural space. Worsening aeration of both lungs dependently, most consistent with atelectasis. Progressive enlargement of multiple pulmonary nodules bilaterally compared with the recent prior study. In the right upper lobe, there is a 1.6 cm nodule on image 27/5 (previously 1.1 cm). A lingular nodule measuring 2.2 cm on image 65/5 previously measured 1.5 cm, and a left lower lobe nodule measuring 1.6 cm on image 50/5 previously measured 1.1 cm. Cavitation of these nodules is less prominent than previously. Upper abdomen:  No acute findings.  Probable hepatic steatosis. Musculoskeletal/Chest wall: There is no chest wall mass or suspicious osseous finding. IMPRESSION: 1. Enlarging nodularity in both lungs compared with recent prior study of 6 days ago, most consistent with an infectious process (septic emboli or opportunistic infection). The rapidity of change would be unusual for Kaposi sarcoma. 2. Enlarging right greater than left pleural effusions following right pleural drainage  catheter placement. 3. Grossly stable thoracic adenopathy, likely reactive. Electronically Signed   By: Richardean Sale M.D.   On: 02/13/2022 15:55   DG CHEST PORT 1 VIEW  Result Date: 02/12/2022 CLINICAL DATA:  Chest tube placement EXAM: PORTABLE CHEST 1 VIEW COMPARISON:  Earlier today at 9:24 a.m. FINDINGS: 5:36 p.m. Placement of a right-sided pigtail pleural catheter. Midline trachea. Normal heart size for level of inspiration. Decrease in small to moderate right-sided pleural effusion with possible loculation laterally. No pneumothorax. Lung volumes are extremely low. Slight improvement in right basilar aeration. Otherwise, similar basilar predominant airspace disease bilaterally. IMPRESSION: Placement of a right-sided pleural drain with decreased pleural fluid and improved right base aeration. Otherwise, similar bilateral airspace disease. Electronically Signed   By: Abigail Miyamoto M.D.   On: 02/12/2022 18:14   DG CHEST PORT 1 VIEW  Result Date: 02/12/2022 CLINICAL DATA:  Dyspnea. EXAM: PORTABLE CHEST 1 VIEW COMPARISON:  02/07/2022 FINDINGS: Stable cardiomediastinal contours. New moderate to large right pleural effusion with veil like opacification over the right lung. Persistent nodular pulmonary opacities throughout both lungs compatible with septic emboli. The visualized osseous structures are unremarkable. IMPRESSION: 1. New moderate to large right pleural effusion. 2. Persistent bilateral nodular pulmonary opacities compatible with septic emboli. Electronically Signed   By: Kerby Moors M.D.   On: 02/12/2022 09:24        Scheduled Meds:  bictegravir-emtricitabine-tenofovir AF  1 tablet Oral Daily   docusate sodium  100 mg  Oral BID   enoxaparin (LOVENOX) injection  40 mg Subcutaneous Q24H   fluconazole  200 mg Oral Daily   guaiFENesin  600 mg Oral BID   lidocaine  2 patch Transdermal Q24H   sodium chloride flush  3 mL Intravenous Q12H   sulfamethoxazole-trimethoprim  1 tablet Oral Once  per day on Mon Wed Fri   Continuous Infusions:  sodium chloride Stopped (02/12/22 1100)   vancomycin 1,250 mg (02/14/22 0155)     LOS: 7 days   Time spent= 35 mins    Lorrena Goranson Arsenio Loader, MD [facility]  If 7PM-7AM, please contact night-coverage  02/14/2022, 7:16 AM

## 2022-02-14 NOTE — Procedures (Signed)
Pleural Fibrinolytic Administration Procedure Note  Rick Lam  [id number]  09/14/1990  Date:02/14/22  Time:1:32 PM   Provider Performing:Diana Davenport D Rollene Rotunda   Procedure: Pleural Fibrinolysis Subsequent day [phone])  Indication(s) Fibrinolysis of complicated pleural effusion  Consent Risks of the procedure as well as the alternatives and risks of each were explained to the patient and/or caregiver.  Consent for the procedure was obtained.   Anesthesia None   Time Out Verified patient identification, verified procedure, site/side was marked, verified correct patient position, special equipment/implants available, medications/allergies/relevant history reviewed, required imaging and test results available.   Sterile Technique Hand hygiene, gloves   Procedure Description Existing pleural catheter was cleaned and accessed in sterile manner.  '10mg'$  of tPA in 30cc of saline and '5mg'$  of dornase in 30cc of sterile water were injected into pleural space using existing pleural catheter.  Catheter will be clamped for 1 hour and then placed back to suction.   Complications/Tolerance None; patient tolerated the procedure well.  EBL None   Specimen(s) None   JD Rexene Agent [facility] 02/14/2022, 1:32 PM  Please see [email or web address] for pager details.  From 7A-7P if no response, please call [phone]. After hours, please call ELink [phone].

## 2022-02-14 NOTE — Significant Event (Addendum)
Rapid Response Event Note   Reason for Call :  Tachypnea (RR 30-40), tachycardia (HR 130s bpm)  Initial Focused Assessment:  Pt AO, lying in bed. Lung sounds are clear, the bases are diminished. Skin is warm, dry, pink. Chest tube to suction, -20cm H2O s/p fibrinolysis instillation today. Patient endorses pain to palpation to his right anterior and lateral chest. No crepitus noted. Surrounding skin to chest tube is unremarkable. Chest tube draining, flushed without resistance.   VS: BP 150/94, HR 137, RR 34, SpO2 91% on 4LNC  Interventions:  CXR 10L HFNC  Plan of Care:  Transfer to ICU  Event Summary:  MD Notified: Dr. Tacy Learn Call Time: 3143 Arrival Time: 8887 End Time: [place], RN

## 2022-02-14 NOTE — Progress Notes (Signed)
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, mediastinal/axillary lymphadenopathy, chronic hep c, hx syphilis late latent s/p tx admitted for sepsis found to have LUE cellulitis/abscess, pulm nodules, and mrsa bacteremia   #mrsa septicemia #pulm septic nodules #pleural effusion #left arm cellulitis/abscess tee 8/18 no vegetation serial xray chest showed evolving new pleural effusion right side s/p chest tube placement 8/20; cx ngtd ortho/general surgery evluated left arm and no indication for surgical intervention postive admission bcx 8/15; repeat bcx 8/17 and 8/19 negative  ----- 8/22 assessment Ongoing fever 8/21 chest ct post chest tube placement still showed rather large bilateral R>L effusion. He has ongoing severe pleuritic chest pain.   -will see how he responds to tpa/dnase. Concern he might need decortication given the symptoms present and large size of the effusion despite chest tube present and ongoing fever -appreciate pulm help -will continue vancomycin -discussed with primary team     #hiv #aids #axillary/mediastinal LAD He has one value of hiv viral load in the 300k after being virologically controlled. Report compliance and I do believe him. He is due for a repeat viral load. He mentioned he missed 1 week as he grabbed a wrong bottle coming to his aunt's house a week ago to help her with chores. His cd4% has been increasing since being on biktarvy LAD likely hiv related -- improved axillary adenopathy on repeat chest ct this admission Repeat viral load this admission 16k in setting missing 1 week art  -will continue bactrim prophy and biktarvy -repeat viral load planned 3-4 weeks from 8/20; if continue to be high will need to send repeat genotype   #hx hep c  infection He has positive hep c lab 05/2021 but repeat viral load (hcv genotype testing) 08/2021 was negative. Repeat hep c viral load this admission negative. Likely cleared it spontaneously    #s/p late latent syphilis tx and rpr improving appropriately    #oral thrush Primary team conconcerned about oral thrush. Ok with 14 days oral fluconazole 200 mg daily  -agree with 2 weeks oral fluconazole 200 mg daily until 9/4   I spent 50 minute reviewing data/chart, and coordinating care and >50% direct face to face time providing counseling/discussing diagnostics/treatment plan with patient   Principal Problem:   Severe sepsis ([place]) Active Problems:   AIDS (acquired immune deficiency syndrome) ([place])   Oral thrush   Hyponatremia   Acute respiratory failure with hypoxia ([place])   Acute septic pulmonary embolism without acute cor pulmonale (HCC)   Cutaneous abscess of left upper extremity   Pleural effusion   Allergies  Allergen Reactions   Banana     Other reaction(s): hives    Scheduled Meds:  bictegravir-emtricitabine-tenofovir AF  1 tablet Oral Daily   docusate sodium  100 mg Oral BID   enoxaparin (LOVENOX) injection  40 mg Subcutaneous Q24H   fluconazole  200 mg Oral Daily   guaiFENesin  600 mg Oral BID   lidocaine  2 patch Transdermal Q24H   sodium chloride flush  10 mL Intrapleural Q8H   sodium chloride flush  3 mL Intravenous Q12H   sulfamethoxazole-trimethoprim  1 tablet Oral Once per day on Mon Wed Fri   Continuous Infusions:  sodium chloride 75 mL/hr at 02/14/22 0751   vancomycin 1,250 mg (02/14/22 0754)   PRN Meds:.acetaminophen **OR** acetaminophen, hydrALAZINE, HYDROmorphone (DILAUDID) injection, hydrOXYzine, ibuprofen, ipratropium-albuterol, metoprolol tartrate, naLOXone (NARCAN)  injection, ondansetron **OR** ondansetron (ZOFRAN) IV, oxyCODONE, senna-docusate, traZODone   SUBJECTIVE: Severe chest pain still; lue pain better Chest ct bilateral large  effusion Planning tpa by pulm  Fever again No other pain site    Review of Systems: ROS All other ROS was negative, except mentioned above     OBJECTIVE: Vitals:   02/14/22 0600 02/14/22 0658 02/14/22 0759 02/14/22 1155  BP:  (!) 101/59 109/62 105/61  Pulse: (!) 109 (!) 103 (!) 101 95  Resp: '20 16 16 17  '$ Temp:  (!) 101.1 F (38.4 C) 98.1 F (36.7 C) 99.2 F (37.3 C)  TempSrc:  Axillary Oral Oral  SpO2: 96% 96% 97% 95%  Weight:      Height:       Body mass index is 22.75 kg/m.  Physical Exam  General/constitutional: no distress, pleasant, sleepy (pain meds) HEENT: Normocephalic, PER, Conj Clear, EOMI, Oropharynx clear Neck supple CV: rrr no mrg Lungs: normal respiratory effort; right sided chest tube functioning Abd: Soft, Nontender Ext: no edema Skin/msk: erythema resolved chest and LUE; lue palpation with some tenderness but no fluctuance -- slight induration still but small Neuro: nonfocal   Lab Results Lab Results  Component Value Date   WBC 10.6 (H) 02/14/2022   HGB 10.4 (L) 02/14/2022   HCT 31.2 (L) 02/14/2022   MCV 80.8 02/14/2022   PLT 391 02/14/2022    Lab Results  Component Value Date   CREATININE 0.78 02/14/2022   BUN 10 02/14/2022   NA 132 (L) 02/14/2022   K 4.3 02/14/2022   CL 99 02/14/2022   CO2 24 02/14/2022    Lab Results  Component Value Date   ALT 20 02/10/2022   AST 18 02/10/2022   ALKPHOS 61 02/10/2022   BILITOT 0.6 02/10/2022      Microbiology: Recent Results (from the past 240 hour(s))  Blood Culture (routine x 2)     Status: Abnormal   Collection Time: 02/07/22  5:01 PM   Specimen: BLOOD RIGHT HAND  Result Value Ref Range Status   Specimen Description   Final    BLOOD RIGHT HAND Performed at [facility], [address], [place], [place] [address]    Special Requests   Final    BOTTLES DRAWN AEROBIC AND ANAEROBIC Blood Culture results may not be optimal due to an inadequate volume of blood received in culture  bottles Performed at [facility], [address], [place], [place] [address]    Culture  Setup Time   Final    GRAM POSITIVE COCCI BOTTLES DRAWN AEROBIC AND ANAEROBIC Gram Stain Report Called to,Read Back By and Verified With: GUILBEAULT,D '@0745'$  BY MATTHEWS, B 8.16.2023 IN BOTH AEROBIC AND ANAEROBIC BOTTLES    Culture (A)  Final    STAPHYLOCOCCUS AUREUS SUSCEPTIBILITIES PERFORMED ON PREVIOUS CULTURE WITHIN THE LAST 5 DAYS. Performed at [facility], [place] [address] [place], [place] [address]    Report Status 02/10/2022 FINAL  Final  Blood Culture (routine x 2)     Status: Abnormal   Collection Time: 02/07/22  5:01 PM   Specimen: BLOOD RIGHT ARM  Result Value Ref Range Status   Specimen Description   Final    BLOOD RIGHT ARM Performed at [facility], [address], [place], [place] [address]    Special Requests   Final    BOTTLES DRAWN AEROBIC AND ANAEROBIC Blood Culture results may not be optimal due to an inadequate volume of blood received in culture bottles Performed at [place] [place], [place] [address]    Culture  Setup Time   Final    GRAM POSITIVE COCCI BOTTLES DRAWN AEROBIC AND ANAEROBIC Gram Stain Report Called to,Read Back By and Verified With: GUILBEAULT,D'@0745'$  BY MATTHEWS, B 8.16.2023 IN BOTH AEROBIC AND ANAEROBIC BOTTLES GRAM STAIN REVIEWED-AGREE WITH RESULT CRITICAL RESULT CALLED TO, READ BACK BY AND VERIFIED WITH: Noemi Chapel [phone] AT 1411 BY EC Performed at [facility], [place] [address], [place], [place] [address]    Culture METHICILLIN RESISTANT STAPHYLOCOCCUS AUREUS (A)  Final   Report Status 02/10/2022 FINAL  Final   Organism ID, Bacteria METHICILLIN RESISTANT STAPHYLOCOCCUS AUREUS  Final      Susceptibility   Methicillin resistant staphylococcus aureus - MIC*    CIPROFLOXACIN >=8 RESISTANT Resistant     ERYTHROMYCIN >=8 RESISTANT Resistant     GENTAMICIN <=0.5 SENSITIVE Sensitive     OXACILLIN >=4 RESISTANT  Resistant     TETRACYCLINE >=16 RESISTANT Resistant     VANCOMYCIN <=0.5 SENSITIVE Sensitive     TRIMETH/SULFA >=320 RESISTANT Resistant     CLINDAMYCIN <=0.25 SENSITIVE Sensitive     RIFAMPIN <=0.5 SENSITIVE Sensitive     Inducible Clindamycin NEGATIVE Sensitive     * METHICILLIN RESISTANT STAPHYLOCOCCUS AUREUS  Blood Culture ID Panel (Reflexed)     Status: Abnormal   Collection Time: 02/07/22  5:01 PM  Result Value Ref Range Status   Enterococcus faecalis NOT DETECTED NOT DETECTED Final   Enterococcus Faecium NOT DETECTED NOT DETECTED Final   Listeria monocytogenes NOT DETECTED NOT DETECTED Final   Staphylococcus species DETECTED (A) NOT DETECTED Final    Comment: CRITICAL RESULT CALLED TO, READ BACK BY AND VERIFIED WITH: PHARMD Alycia Rossetti [phone] AT 1411 BY EC    Staphylococcus aureus (BCID) DETECTED (A) NOT DETECTED Final    Comment: Methicillin (oxacillin)-resistant Staphylococcus aureus (MRSA). MRSA is predictably resistant to beta-lactam antibiotics (except ceftaroline). Preferred therapy is vancomycin unless clinically contraindicated. Patient requires contact precautions if  hospitalized. CRITICAL RESULT CALLED TO, READ BACK BY AND VERIFIED WITH: [place] [phone] AT 4825 BY EC    Staphylococcus epidermidis NOT DETECTED NOT DETECTED Final   Staphylococcus lugdunensis NOT DETECTED NOT DETECTED Final   Streptococcus species NOT DETECTED NOT DETECTED Final   Streptococcus agalactiae NOT DETECTED NOT DETECTED Final   Streptococcus pneumoniae NOT DETECTED NOT DETECTED Final   Streptococcus pyogenes NOT DETECTED NOT DETECTED Final   A.calcoaceticus-baumannii NOT DETECTED NOT DETECTED Final   Bacteroides fragilis NOT DETECTED NOT DETECTED Final   Enterobacterales NOT DETECTED NOT DETECTED Final   Enterobacter cloacae complex NOT DETECTED NOT DETECTED Final   Escherichia coli NOT DETECTED NOT DETECTED Final   Klebsiella aerogenes NOT DETECTED NOT DETECTED  Final   Klebsiella oxytoca NOT DETECTED NOT DETECTED Final   Klebsiella pneumoniae NOT DETECTED NOT DETECTED Final   Proteus species NOT DETECTED NOT DETECTED Final   Salmonella species NOT DETECTED NOT DETECTED Final   Serratia marcescens NOT DETECTED NOT DETECTED Final   Haemophilus influenzae NOT DETECTED NOT DETECTED Final   Neisseria meningitidis NOT DETECTED NOT  DETECTED Final   Pseudomonas aeruginosa NOT DETECTED NOT DETECTED Final   Stenotrophomonas maltophilia NOT DETECTED NOT DETECTED Final   Candida albicans NOT DETECTED NOT DETECTED Final   Candida auris NOT DETECTED NOT DETECTED Final   Candida glabrata NOT DETECTED NOT DETECTED Final   Candida krusei NOT DETECTED NOT DETECTED Final   Candida parapsilosis NOT DETECTED NOT DETECTED Final   Candida tropicalis NOT DETECTED NOT DETECTED Final   Cryptococcus neoformans/gattii NOT DETECTED NOT DETECTED Final   Meth resistant mecA/C and MREJ DETECTED (A) NOT DETECTED Final    Comment: CRITICAL RESULT CALLED TO, READ BACK BY AND VERIFIED WITH: Noemi Chapel [phone] AT 1411 BY EC Performed at [facility], [place] [address], [place], [place] [address]   Resp Panel by RT-PCR (Flu A&B, Covid) Anterior Nasal Swab     Status: None   Collection Time: 02/07/22  5:20 PM   Specimen: Anterior Nasal Swab  Result Value Ref Range Status   SARS Coronavirus 2 by RT PCR NEGATIVE NEGATIVE Final    Comment: (NOTE) SARS-CoV-2 target nucleic acids are NOT DETECTED.  The SARS-CoV-2 RNA is generally detectable in upper respiratory specimens during the acute phase of infection. The lowest concentration of SARS-CoV-2 viral copies this assay can detect is 138 copies/mL. A negative result does not preclude SARS-Cov-2 infection and should not be used as the sole basis for treatment or other patient management decisions. A negative result may occur with  improper specimen collection/handling, submission of specimen other than  nasopharyngeal swab, presence of viral mutation(s) within the areas targeted by this assay, and inadequate number of viral copies(<138 copies/mL). A negative result must be combined with clinical observations, patient history, and epidemiological information. The expected result is Negative.  Fact Sheet for Patients:  EntrepreneurPulse.com.au  Fact Sheet for Healthcare Providers:  IncredibleEmployment.be  This test is no t yet approved or cleared by the [place] FDA and  has been authorized for detection and/or diagnosis of SARS-CoV-2 by FDA under an Emergency Use Authorization (EUA). This EUA will remain  in effect (meaning this test can be used) for the duration of the COVID-19 declaration under Section 564(b)(1) of the Act, 21 U.S.C.section 360bbb-3(b)(1), unless the authorization is terminated  or revoked sooner.       Influenza A by PCR NEGATIVE NEGATIVE Final   Influenza B by PCR NEGATIVE NEGATIVE Final    Comment: (NOTE) The Xpert Xpress SARS-CoV-2/FLU/RSV plus assay is intended as an aid in the diagnosis of influenza from Nasopharyngeal swab specimens and should not be used as a sole basis for treatment. Nasal washings and aspirates are unacceptable for Xpert Xpress SARS-CoV-2/FLU/RSV testing.  Fact Sheet for Patients: EntrepreneurPulse.com.au  Fact Sheet for Healthcare Providers: IncredibleEmployment.be  This test is not yet approved or cleared by the [place] FDA and has been authorized for detection and/or diagnosis of SARS-CoV-2 by FDA under an Emergency Use Authorization (EUA). This EUA will remain in effect (meaning this test can be used) for the duration of the COVID-19 declaration under Section 564(b)(1) of the Act, 21 U.S.C. section 360bbb-3(b)(1), unless the authorization is terminated or revoked.  Performed at [facility], [address], [place], [place] [address]   Urine  Culture     Status: None   Collection Time: 02/07/22 11:32 PM   Specimen: In/Out Cath Urine  Result Value Ref Range Status   Specimen Description IN/OUT CATH URINE  Final   Special Requests NONE  Final   Culture  Final    NO GROWTH Performed at [facility], [place] [address], [place], [place] [address]    Report Status 02/08/2022 FINAL  Final  Culture, blood (Routine X 2) w Reflex to ID Panel     Status: None   Collection Time: 02/09/22  4:32 AM   Specimen: BLOOD RIGHT HAND  Result Value Ref Range Status   Specimen Description BLOOD RIGHT HAND  Final   Special Requests   Final    AEROBIC BOTTLE ONLY Blood Culture results may not be optimal due to an excessive volume of blood received in culture bottles   Culture   Final    NO GROWTH 5 DAYS Performed at [facility], [place] [address], [place], [place] [address]    Report Status 02/14/2022 FINAL  Final  Culture, blood (Routine X 2) w Reflex to ID Panel     Status: None   Collection Time: 02/09/22  4:32 AM   Specimen: BLOOD LEFT HAND  Result Value Ref Range Status   Specimen Description BLOOD LEFT HAND  Final   Special Requests   Final    AEROBIC BOTTLE ONLY Blood Culture results may not be optimal due to an excessive volume of blood received in culture bottles   Culture   Final    NO GROWTH 5 DAYS Performed at [facility], [place] [address], [place], [place] [address]    Report Status 02/14/2022 FINAL  Final  Culture, blood (Routine X 2) w Reflex to ID Panel     Status: None (Preliminary result)   Collection Time: 02/11/22  3:49 AM   Specimen: BLOOD LEFT HAND  Result Value Ref Range Status   Specimen Description BLOOD LEFT HAND  Final   Special Requests   Final    BOTTLES DRAWN AEROBIC ONLY Blood Culture results may not be optimal due to an inadequate volume of blood received in culture bottles   Culture   Final    NO GROWTH 3 DAYS Performed at [facility], [place] [address], [place], [place] [address]     Report Status PENDING  Incomplete  Culture, blood (Routine X 2) w Reflex to ID Panel     Status: None (Preliminary result)   Collection Time: 02/11/22  3:57 AM   Specimen: BLOOD RIGHT HAND  Result Value Ref Range Status   Specimen Description BLOOD RIGHT HAND  Final   Special Requests   Final    BOTTLES DRAWN AEROBIC ONLY Blood Culture results may not be optimal due to an inadequate volume of blood received in culture bottles   Culture   Final    NO GROWTH 3 DAYS Performed at [facility], [place] [address], [place], [place] [address]    Report Status PENDING  Incomplete  Body fluid culture w Gram Stain     Status: None (Preliminary result)   Collection Time: 02/12/22  4:46 PM   Specimen: Pleural Fluid  Result Value Ref Range Status   Specimen Description FLUID  Final   Special Requests Immunocompromised  Final   Gram Stain NO WBC SEEN NO ORGANISMS SEEN   Final   Culture   Final    NO GROWTH 2 DAYS Performed at [facility], [place] [address] [place], [place] [address]    Report Status PENDING  Incomplete     Serology:   Imaging: If present, new imagings (plain films, ct scans, and mri) have been personally visualized and interpreted;  radiology reports have been reviewed. Decision making incorporated into the Impression / Recommendations.   8/22 chest ct 1. Severe cellulitis of the upper arm, with two small 1.5 cm superficial abscesses in the medial proximal upper arm and lateral chest wall/axilla.   8/16 tte  1. Left ventricular ejection fraction, by estimation, is 65 to 70%. The  left ventricle has normal function. The left ventricle has no regional  wall motion abnormalities. Left ventricular diastolic parameters were  normal.   2. Right ventricular systolic function is normal. The right ventricular  size is normal.   3. The mitral valve is normal in structure. No evidence of mitral valve  regurgitation. No evidence of mitral stenosis.   4. The aortic  valve has an indeterminant number of cusps. Aortic valve  regurgitation is not visualized. No aortic stenosis is present.   5. The inferior vena cava is normal in size with greater than 50%  respiratory variability, suggesting right atrial pressure of 3 mmHg.    8/16 mri left humerus Soft tissue Severe circumferential soft tissue swelling and enhancement of the upper arm. There is a small 1.5 x 1.0 x 1.6 cm rim enhancing fluid collection in the medial proximal upper arm just deep to the skin surface (series 11, image 20). There is a second slightly smaller 1.5 x 0.6 x 1.2 cm rim enhancing fluid collection in the lateral chest wall/axilla (series 11, image 21).   Multifocal patchy airspace disease in the left lung.   IMPRESSION: 1. Severe cellulitis of the upper arm, with two small 1.5 cm superficial abscesses in the medial proximal upper arm and lateral chest wall/axilla.   8/15 ct chest with contrast 1. Innumerable pulmonary nodules scattered throughout both lungs, several of which are cavitated. Differential considerations include septic pulmonary emboli as well as atypical bacterial or fungal pneumonia given history of HIV. 2. Asymmetric nodular skin thickening and subcutaneous edema in the left axilla, concerning for cellulitis. No discrete abscess. 3. Trace bilateral pleural effusions.    Jabier Mutton, [place] [facility] Infectious [place] [phone] pager    02/14/2022, 2:41 PM

## 2022-02-14 NOTE — Progress Notes (Signed)
eLink Physician-Brief Progress Note Patient Name: Rick Lam DOB: 07-Sep-1990 MRN: [id number]   Date of Service  02/14/2022  HPI/Events of Note  Patient with advanced HIV / AIDS  admitted with MRSA bacteremia and sepsis, who was transferred to the ICU earlier this evening with worsening shortness of breath, tachycardia and increasing oxygen requirement.  eICU Interventions  New Patient Evaluation.        Frederik Pear 02/14/2022, 7:37 PM

## 2022-02-14 NOTE — Progress Notes (Signed)
Subjective:  Patient reports pain as improving in the LUE. He remains stable on RNF. Mother at bedside.  Objective:   VITALS:  Temp:  [98.7 F (37.1 C)-100.2 F (37.9 C)] 98.9 F (37.2 C) (08/21 2310) Pulse Rate:  [109-119] 109 (08/21 2310) Resp:  [19-25] 19 (08/21 2310) BP: (110-125)/(50-76) 125/76 (08/21 2310) SpO2:  [93 %-97 %] 94 % (08/21 2310) Weight:  [78.3 kg] 78.3 kg (08/21 0428)  Gen: AAOx3, NAD Comfortable at rest  Left Upper Extremity: Skin intact, no blistering No crepitus Mild TTP over left medial arm but moderate TTP centered over axilla AIN/PIN/Ulnar intact SILT throughout Radial, Ulnar + to palp CR < 2s    LABS Recent Labs    02/11/22 0341 02/12/22 0449  HGB 11.5* 10.2*  WBC 12.1* 9.6  PLT 185 238   Recent Labs    02/11/22 0341 02/12/22 0449  NA 131* 131*  K 3.6 3.5  CL 92* 94*  CO2 28 28  BUN 5* 6  CREATININE 0.57* 0.65  GLUCOSE 108* 107*   No results for input(s): "LABPT", "INR" in the last 72 hours.   Assessment/Plan: Ortho consult for left axillary abscess with adjacent left arm cellulitis  -advise general surgery consult for potential I&D of the left axilla -continue broad spectrum IV abx  -WBAT LUE, no restrictions -appreciate primary team care -Ortho will sign off  Armond Hang 02/14/2022, 12:26 AM

## 2022-02-15 ENCOUNTER — Telehealth: Payer: Self-pay

## 2022-02-15 ENCOUNTER — Inpatient Hospital Stay (HOSPITAL_COMMUNITY): Payer: Self-pay

## 2022-02-15 DIAGNOSIS — J90 Pleural effusion, not elsewhere classified: Secondary | ICD-10-CM

## 2022-02-15 DIAGNOSIS — R911 Solitary pulmonary nodule: Secondary | ICD-10-CM

## 2022-02-15 LAB — BASIC METABOLIC PANEL
Anion gap: 8 (ref 5–15)
BUN: 17 mg/dL (ref 6–20)
CO2: 25 mmol/L (ref 22–32)
Calcium: 7.8 mg/dL — ABNORMAL LOW (ref 8.9–10.3)
Chloride: 98 mmol/L (ref 98–111)
Creatinine, Ser: 0.73 mg/dL (ref 0.61–1.24)
GFR, Estimated: >60 mL/min (ref >60)
Glucose, Bld: 109 mg/dL — ABNORMAL HIGH (ref 70–99)
Potassium: 4.3 mmol/L (ref 3.5–5.1)
Sodium: 131 mmol/L — ABNORMAL LOW (ref 135–145)

## 2022-02-15 LAB — CBC
HCT: 31.5 % — ABNORMAL LOW (ref 39.0–52.0)
Hemoglobin: 10.5 g/dL — ABNORMAL LOW (ref 13.0–17.0)
MCH: 27.6 pg (ref 26.0–34.0)
MCHC: 33.3 g/dL (ref 30.0–36.0)
MCV: 82.9 fL (ref 80.0–100.0)
Platelets: 434 10*3/uL — ABNORMAL HIGH (ref 150–400)
RBC: 3.8 MIL/uL — ABNORMAL LOW (ref 4.22–5.81)
RDW: 16.5 % — ABNORMAL HIGH (ref 11.5–15.5)
WBC: 10.7 10*3/uL — ABNORMAL HIGH (ref 4.0–10.5)
nRBC: 0 % (ref 0.0–0.2)

## 2022-02-15 MED ORDER — BISACODYL 10 MG RE SUPP
10.0000 mg | Freq: Every day | RECTAL | Status: DC | PRN
Start: 2022-02-15 — End: 2022-02-22

## 2022-02-15 MED ORDER — STERILE WATER FOR INJECTION IJ SOLN
5.0000 mg | Freq: Once | INTRAMUSCULAR | Status: AC
  Administered 2022-02-15: 5 mg via INTRAPLEURAL
  Filled 2022-02-15: qty 5

## 2022-02-15 MED ORDER — POLYETHYLENE GLYCOL 3350 17 G PO PACK
17.0000 g | PACK | Freq: Two times a day (BID) | ORAL | Status: DC
  Administered 2022-02-15 – 2022-02-18 (×3): 17 g via ORAL
  Filled 2022-02-15 (×7): qty 1

## 2022-02-15 MED ORDER — SODIUM CHLORIDE 0.9% FLUSH
10.0000 mL | Freq: Three times a day (TID) | INTRAVENOUS | Status: DC
  Administered 2022-02-15 – 2022-02-19 (×7): 10 mL via INTRAPLEURAL

## 2022-02-15 MED ORDER — ORAL CARE MOUTH RINSE
15.0000 mL | OROMUCOSAL | Status: DC | PRN
Start: 2022-02-15 — End: 2022-02-22

## 2022-02-15 MED ORDER — SENNOSIDES-DOCUSATE SODIUM 8.6-50 MG PO TABS
1.0000 | ORAL_TABLET | Freq: Two times a day (BID) | ORAL | Status: DC
  Administered 2022-02-15 – 2022-02-18 (×4): 1 via ORAL
  Filled 2022-02-15 (×8): qty 1

## 2022-02-15 MED ORDER — SODIUM CHLORIDE (PF) 0.9 % IJ SOLN
10.0000 mg | Freq: Once | INTRAMUSCULAR | Status: AC
  Administered 2022-02-15: 10 mg via INTRAPLEURAL
  Filled 2022-02-15: qty 10

## 2022-02-15 NOTE — Progress Notes (Signed)
PROGRESS NOTE    Rick Lam  SWN:[id number] DOB: May 10, 1991 DOA: 02/07/2022 PCP: No primary care provider on file.   Brief Narrative: 31 year old with past medical history significant for HIV/AIDS, recent CD4 56, chronic hepatitis C, latent syphilis that has been treated previously.  Presented with painful left axillary nodules with surrounding erythema he was admitted with sepsis, found to have MRSA bacteremia secondary to axillary abscess and cellulitis.  He underwent TEE 8/18 which was negative for endocarditis.  ID is following.  Patient developed worsening respiratory distress 8/20.  He was found to have a large right-sided pleural effusion, empyema.  Chest tube was placed.  [place] to ICU 80/20 secondary to worsening respiratory failure in the setting of pigtail kink.     Assessment & Plan:   Principal Problem:   Severe sepsis ([place]) Active Problems:   AIDS (acquired immune deficiency syndrome) (HCC)   Oral thrush   Hyponatremia   Acute respiratory failure with hypoxia (HCC)   Acute septic pulmonary embolism without acute cor pulmonale (HCC)   Cutaneous abscess of left upper extremity   Pleural effusion   1-Sepsis secondary to MRSA bacteremia,  bilateral likely pulmonary septic emboli, Left arm cellulitis and abscess, Empyema -Patient immunosuppressed, in the setting of HIV/AIDS. -Blood cultures from 8/15 positive for MRSA. -Repeated blood culture 8/17 no growth to date. Blood culture 8/19: No growth to date.  -MRI left upper extremity with a small abscess x2 evaluated by Ortho, did not recommend drainage at that time. -TEE 8/18 negative for endocarditis -Pulmonology recommended repeat CT chest at the end of treatment for MRSA if lesions persist consider outpatient bronchoscopy. -Continue with IV vancomycin -Surgery consulted for further evaluation of axillary abscess on 8/19. Surgery recommend warm compress, IV antibiotics, no need for drainage.   Acute hypoxic  respiratory failure: Bilateral pulmonary nodules, concern with septic emboli Right side Empyema:  Continue with oxygen supplementation.  Incentive spirometry/ Flutter Valve S/P pigtail catheter placement right  8/20. CCM following,  Second dose of lytics on 8/23rd Plan to repeat chest x-ray tomorrow Pleural fluid, no growth to date.   HIV/AIDS: Continue with Biktarvy and Bactrim ID following, appreciate assistance  Hyponatremia: Could be combination of dehydration and some component of SIADH Sodium improved with hydration.  AKI: Resolved Oral thrush; odynophagia; On IV diflucan.   Estimated body mass index is 23.77 kg/m as calculated from the following:   Height as of this encounter: 6' (1.829 m).   Weight as of this encounter: 79.5 kg.   DVT prophylaxis: Lovenox Code Status: Full code Family Communication: Care discussed with patient and mother at bedside.  Disposition Plan:  Status is: Inpatient Remains inpatient appropriate because: Continue with management of MRSA bacteremia, axillary abscess, bilateral pulmonary septic emboli. Empyema/     Consultants:  ID Surgery   Procedures:  TEE 8/18 negative for endocarditis.   Antimicrobials:  Vancomycin   Subjective: He is breathing better, appears in less distress.  Pain controlled.    Objective: Vitals:   02/15/22 1130 02/15/22 1200 02/15/22 1300 02/15/22 1400  BP:  (!) 116/55 (!) 110/59 (!) 107/57  Pulse:  96 93 (!) 103  Resp:  (!) '22 15 16  '$ Temp:      TempSrc:      SpO2: 94% 93% 93% 93%  Weight:      Height:        Intake/Output Summary (Last 24 hours) at 02/15/2022 1526 Last data filed at 02/15/2022 1200 Gross per 24 hour  Intake  3834.64 ml  Output 1355 ml  Net 2479.64 ml    Filed Weights   02/13/22 0428 02/14/22 0123 02/15/22 0500  Weight: 78.3 kg 76.1 kg 79.5 kg    Examination:  General exam: NAD Respiratory system: BL ronchus, chest tube right Cardiovascular system: S 1, S 2  RRR Gastrointestinal system: BS present, soft, nt Central nervous system: Alert, NAD Extremities: No edema    Data Reviewed: I have personally reviewed following labs and imaging studies  CBC: Recent Labs  Lab 02/09/22 0432 02/10/22 0746 02/11/22 0341 02/12/22 0449 02/14/22 0459 02/15/22 0623  WBC 8.6 10.6* 12.1* 9.6 10.6* 10.7*  NEUTROABS 6.8 8.4* 8.7*  --   --   --   HGB 11.0* 10.8* 11.5* 10.2* 10.4* 10.5*  HCT 32.3* 32.8* 34.3* 29.8* 31.2* 31.5*  MCV 80.5 83.0 81.9 80.3 80.8 82.9  PLT 134* 148* 185 238 391 434*    Basic Metabolic Panel: Recent Labs  Lab 02/10/22 0746 02/11/22 0341 02/12/22 0449 02/14/22 0459 02/15/22 0623  NA 129* 131* 131* 132* 131*  K 3.6 3.6 3.5 4.3 4.3  CL 95* 92* 94* 99 98  CO2 '26 28 28 24 25  '$ GLUCOSE 158* 108* 107* 103* 109*  BUN 6 5* '6 10 17  '$ CREATININE 0.65 0.57* 0.65 0.78 0.73  CALCIUM 7.7* 7.8* 7.4* 7.8* 7.8*  MG  --  1.9  --  1.8  --     GFR: Estimated Creatinine Clearance: 144.2 mL/min (by C-G formula based on SCr of 0.73 mg/dL). Liver Function Tests: Recent Labs  Lab 02/09/22 0432 02/10/22 0746  AST 28 18  ALT 27 20  ALKPHOS 63 61  BILITOT 0.8 0.6  PROT 5.6* 5.4*  ALBUMIN 2.1* 2.0*    No results for input(s): "LIPASE", "AMYLASE" in the last 168 hours. No results for input(s): "AMMONIA" in the last 168 hours. Coagulation Profile: No results for input(s): "INR", "PROTIME" in the last 168 hours.  Cardiac Enzymes: No results for input(s): "CKTOTAL", "CKMB", "CKMBINDEX", "TROPONINI" in the last 168 hours. BNP (last 3 results) No results for input(s): "PROBNP" in the last 8760 hours. HbA1C: No results for input(s): "HGBA1C" in the last 72 hours. CBG: Recent Labs  Lab 02/11/22 1756  GLUCAP 158*    Lipid Profile: No results for input(s): "CHOL", "HDL", "LDLCALC", "TRIG", "CHOLHDL", "LDLDIRECT" in the last 72 hours. Thyroid Function Tests: No results for input(s): "TSH", "T4TOTAL", "FREET4", "T3FREE", "THYROIDAB"  in the last 72 hours.  Anemia Panel: No results for input(s): "VITAMINB12", "FOLATE", "FERRITIN", "TIBC", "IRON", "RETICCTPCT" in the last 72 hours. Sepsis Labs: No results for input(s): "PROCALCITON", "LATICACIDVEN" in the last 168 hours.   Recent Results (from the past 240 hour(s))  Blood Culture (routine x 2)     Status: Abnormal   Collection Time: 02/07/22  5:01 PM   Specimen: BLOOD RIGHT HAND  Result Value Ref Range Status   Specimen Description   Final    BLOOD RIGHT HAND Performed at [facility], [address] [place], [place] [address]    Special Requests   Final    BOTTLES DRAWN AEROBIC AND ANAEROBIC Blood Culture results may not be optimal due to an inadequate volume of blood received in culture bottles Performed at [facility], [address] [place], [place] [address]    Culture  Setup Time   Final    GRAM POSITIVE COCCI BOTTLES DRAWN AEROBIC AND ANAEROBIC Gram Stain Report Called to,Read Back By and Verified With: GUILBEAULT,D '@0745'$  BY MATTHEWS, B 8.16.2023  IN BOTH AEROBIC AND ANAEROBIC BOTTLES    Culture (A)  Final    STAPHYLOCOCCUS AUREUS SUSCEPTIBILITIES PERFORMED ON PREVIOUS CULTURE WITHIN THE LAST 5 DAYS. Performed at [facility], [place] [address] [place], [place] [address]    Report Status 02/10/2022 FINAL  Final  Blood Culture (routine x 2)     Status: Abnormal   Collection Time: 02/07/22  5:01 PM   Specimen: BLOOD RIGHT ARM  Result Value Ref Range Status   Specimen Description   Final    BLOOD RIGHT ARM Performed at [facility], [address], [place], [place] [address]    Special Requests   Final    BOTTLES DRAWN AEROBIC AND ANAEROBIC Blood Culture results may not be optimal due to an inadequate volume of blood received in culture bottles Performed at [address] [place], [place] [address]    Culture  Setup Time   Final    GRAM POSITIVE COCCI BOTTLES DRAWN AEROBIC AND ANAEROBIC Gram Stain Report Called to,Read Back By and  Verified With: GUILBEAULT,D'@0745'$  BY MATTHEWS, B 8.16.2023 IN BOTH AEROBIC AND ANAEROBIC BOTTLES GRAM STAIN REVIEWED-AGREE WITH RESULT CRITICAL RESULT CALLED TO, READ BACK BY AND VERIFIED WITH: Noemi Chapel [phone] AT 1411 BY EC Performed at [facility], [place] [address] [place], [place] [address]    Culture METHICILLIN RESISTANT STAPHYLOCOCCUS AUREUS (A)  Final   Report Status 02/10/2022 FINAL  Final   Organism ID, Bacteria METHICILLIN RESISTANT STAPHYLOCOCCUS AUREUS  Final      Susceptibility   Methicillin resistant staphylococcus aureus - MIC*    CIPROFLOXACIN >=8 RESISTANT Resistant     ERYTHROMYCIN >=8 RESISTANT Resistant     GENTAMICIN <=0.5 SENSITIVE Sensitive     OXACILLIN >=4 RESISTANT Resistant     TETRACYCLINE >=16 RESISTANT Resistant     VANCOMYCIN <=0.5 SENSITIVE Sensitive     TRIMETH/SULFA >=320 RESISTANT Resistant     CLINDAMYCIN <=0.25 SENSITIVE Sensitive     RIFAMPIN <=0.5 SENSITIVE Sensitive     Inducible Clindamycin NEGATIVE Sensitive     * METHICILLIN RESISTANT STAPHYLOCOCCUS AUREUS  Blood Culture ID Panel (Reflexed)     Status: Abnormal   Collection Time: 02/07/22  5:01 PM  Result Value Ref Range Status   Enterococcus faecalis NOT DETECTED NOT DETECTED Final   Enterococcus Faecium NOT DETECTED NOT DETECTED Final   Listeria monocytogenes NOT DETECTED NOT DETECTED Final   Staphylococcus species DETECTED (A) NOT DETECTED Final    Comment: CRITICAL RESULT CALLED TO, READ BACK BY AND VERIFIED WITH: PHARMD Alycia Rossetti [id number] AT 1411 BY EC    Staphylococcus aureus (BCID) DETECTED (A) NOT DETECTED Final    Comment: Methicillin (oxacillin)-resistant Staphylococcus aureus (MRSA). MRSA is predictably resistant to beta-lactam antibiotics (except ceftaroline). Preferred therapy is vancomycin unless clinically contraindicated. Patient requires contact precautions if  hospitalized. CRITICAL RESULT CALLED TO, READ BACK BY AND VERIFIED WITH: [place] [id number] AT 4765 BY EC    Staphylococcus epidermidis NOT DETECTED NOT DETECTED Final   Staphylococcus lugdunensis NOT DETECTED NOT DETECTED Final   Streptococcus species NOT DETECTED NOT DETECTED Final   Streptococcus agalactiae NOT DETECTED NOT DETECTED Final   Streptococcus pneumoniae NOT DETECTED NOT DETECTED Final   Streptococcus pyogenes NOT DETECTED NOT DETECTED Final   A.calcoaceticus-baumannii NOT DETECTED NOT DETECTED Final   Bacteroides fragilis NOT DETECTED NOT DETECTED Final   Enterobacterales NOT DETECTED NOT DETECTED Final   Enterobacter cloacae complex NOT DETECTED NOT DETECTED Final  Escherichia coli NOT DETECTED NOT DETECTED Final   Klebsiella aerogenes NOT DETECTED NOT DETECTED Final   Klebsiella oxytoca NOT DETECTED NOT DETECTED Final   Klebsiella pneumoniae NOT DETECTED NOT DETECTED Final   Proteus species NOT DETECTED NOT DETECTED Final   Salmonella species NOT DETECTED NOT DETECTED Final   Serratia marcescens NOT DETECTED NOT DETECTED Final   Haemophilus influenzae NOT DETECTED NOT DETECTED Final   Neisseria meningitidis NOT DETECTED NOT DETECTED Final   Pseudomonas aeruginosa NOT DETECTED NOT DETECTED Final   Stenotrophomonas maltophilia NOT DETECTED NOT DETECTED Final   Candida albicans NOT DETECTED NOT DETECTED Final   Candida auris NOT DETECTED NOT DETECTED Final   Candida glabrata NOT DETECTED NOT DETECTED Final   Candida krusei NOT DETECTED NOT DETECTED Final   Candida parapsilosis NOT DETECTED NOT DETECTED Final   Candida tropicalis NOT DETECTED NOT DETECTED Final   Cryptococcus neoformans/gattii NOT DETECTED NOT DETECTED Final   Meth resistant mecA/C and MREJ DETECTED (A) NOT DETECTED Final    Comment: CRITICAL RESULT CALLED TO, READ BACK BY AND VERIFIED WITH: Noemi Chapel [id number] AT 1411 BY EC Performed at [facility], [place] [address] [place], [place] [address]   Resp Panel by RT-PCR (Flu A&B, Covid) Anterior Nasal  Swab     Status: None   Collection Time: 02/07/22  5:20 PM   Specimen: Anterior Nasal Swab  Result Value Ref Range Status   SARS Coronavirus 2 by RT PCR NEGATIVE NEGATIVE Final    Comment: (NOTE) SARS-CoV-2 target nucleic acids are NOT DETECTED.  The SARS-CoV-2 RNA is generally detectable in upper respiratory specimens during the acute phase of infection. The lowest concentration of SARS-CoV-2 viral copies this assay can detect is 138 copies/mL. A negative result does not preclude SARS-Cov-2 infection and should not be used as the sole basis for treatment or other patient management decisions. A negative result may occur with  improper specimen collection/handling, submission of specimen other than nasopharyngeal swab, presence of viral mutation(s) within the areas targeted by this assay, and inadequate number of viral copies(<138 copies/mL). A negative result must be combined with clinical observations, patient history, and epidemiological information. The expected result is Negative.  Fact Sheet for Patients:  EntrepreneurPulse.com.au  Fact Sheet for Healthcare Providers:  IncredibleEmployment.be  This test is no t yet approved or cleared by the [place] FDA and  has been authorized for detection and/or diagnosis of SARS-CoV-2 by FDA under an Emergency Use Authorization (EUA). This EUA will remain  in effect (meaning this test can be used) for the duration of the COVID-19 declaration under Section 564(b)(1) of the Act, 21 U.S.C.section 360bbb-3(b)(1), unless the authorization is terminated  or revoked sooner.       Influenza A by PCR NEGATIVE NEGATIVE Final   Influenza B by PCR NEGATIVE NEGATIVE Final    Comment: (NOTE) The Xpert Xpress SARS-CoV-2/FLU/RSV plus assay is intended as an aid in the diagnosis of influenza from Nasopharyngeal swab specimens and should not be used as a sole basis for treatment. Nasal washings and aspirates  are unacceptable for Xpert Xpress SARS-CoV-2/FLU/RSV testing.  Fact Sheet for Patients: EntrepreneurPulse.com.au  Fact Sheet for Healthcare Providers: IncredibleEmployment.be  This test is not yet approved or cleared by the [place] FDA and has been authorized for detection and/or diagnosis of SARS-CoV-2 by FDA under an Emergency Use Authorization (EUA). This EUA will remain in effect (meaning this test can be used) for the duration of the COVID-19 declaration under  Section 564(b)(1) of the Act, 21 U.S.C. section 360bbb-3(b)(1), unless the authorization is terminated or revoked.  Performed at [facility], [address] [place], [place] [address]   Urine Culture     Status: None   Collection Time: 02/07/22 11:32 PM   Specimen: In/Out Cath Urine  Result Value Ref Range Status   Specimen Description IN/OUT CATH URINE  Final   Special Requests NONE  Final   Culture   Final    NO GROWTH Performed at [facility], [place] [address] [place], [place] [address]    Report Status 02/08/2022 FINAL  Final  Culture, blood (Routine X 2) w Reflex to ID Panel     Status: None   Collection Time: 02/09/22  4:32 AM   Specimen: BLOOD RIGHT HAND  Result Value Ref Range Status   Specimen Description BLOOD RIGHT HAND  Final   Special Requests   Final    AEROBIC BOTTLE ONLY Blood Culture results may not be optimal due to an excessive volume of blood received in culture bottles   Culture   Final    NO GROWTH 5 DAYS Performed at [facility], [place] [address] [place], [place] [address]    Report Status 02/14/2022 FINAL  Final  Culture, blood (Routine X 2) w Reflex to ID Panel     Status: None   Collection Time: 02/09/22  4:32 AM   Specimen: BLOOD LEFT HAND  Result Value Ref Range Status   Specimen Description BLOOD LEFT HAND  Final   Special Requests   Final    AEROBIC BOTTLE ONLY Blood Culture results may not be optimal due to an excessive  volume of blood received in culture bottles   Culture   Final    NO GROWTH 5 DAYS Performed at [facility], [place] [address] [place], [place] [address]    Report Status 02/14/2022 FINAL  Final  Culture, blood (Routine X 2) w Reflex to ID Panel     Status: None (Preliminary result)   Collection Time: 02/11/22  3:49 AM   Specimen: BLOOD LEFT HAND  Result Value Ref Range Status   Specimen Description BLOOD LEFT HAND  Final   Special Requests   Final    BOTTLES DRAWN AEROBIC ONLY Blood Culture results may not be optimal due to an inadequate volume of blood received in culture bottles   Culture   Final    NO GROWTH 4 DAYS Performed at [facility], [place] [address], [place], [place] [address]    Report Status PENDING  Incomplete  Culture, blood (Routine X 2) w Reflex to ID Panel     Status: None (Preliminary result)   Collection Time: 02/11/22  3:57 AM   Specimen: BLOOD RIGHT HAND  Result Value Ref Range Status   Specimen Description BLOOD RIGHT HAND  Final   Special Requests   Final    BOTTLES DRAWN AEROBIC ONLY Blood Culture results may not be optimal due to an inadequate volume of blood received in culture bottles   Culture   Final    NO GROWTH 4 DAYS Performed at [facility], [place] [address] [place], [place] [address]    Report Status PENDING  Incomplete  Body fluid culture w Gram Stain     Status: None (Preliminary result)   Collection Time: 02/12/22  4:46 PM   Specimen: Pleural Fluid  Result Value Ref Range Status   Specimen Description FLUID  Final  Special Requests Immunocompromised  Final   Gram Stain NO WBC SEEN NO ORGANISMS SEEN   Final   Culture   Final    NO GROWTH 3 DAYS Performed at [facility], [place] [address], [place], [place] [address]    Report Status PENDING  Incomplete  Fungus Culture With Stain     Status: None (Preliminary result)   Collection Time: 02/12/22  4:46 PM   Specimen: Pleural Fluid  Result Value Ref Range Status    Fungus Stain Final report  Final    Comment: (NOTE) Performed At: [facility] [place], [place] [id number] Rush Farmer MD PR:[id number]    Fungus (Mycology) Culture PENDING  Incomplete   Fungal Source FLUID  Final    Comment: Performed at [facility], [place] [address] [place], [place] [address]  Fungus Culture Result     Status: None   Collection Time: 02/12/22  4:46 PM  Result Value Ref Range Status   Result 1 Comment  Final    Comment: (NOTE) KOH/Calcofluor preparation:  no fungus observed. Performed At: [facility] [place], [place] [id number] Rush Farmer MD SP:[id number]   MRSA Next Gen by PCR, Nasal     Status: Abnormal   Collection Time: 02/14/22  7:22 PM   Specimen: Nasal Mucosa; Nasal Swab  Result Value Ref Range Status   MRSA by PCR Next Gen DETECTED (A) NOT DETECTED Final    Comment: RESULT CALLED TO, READ BACK BY AND VERIFIED WITH: RN Eulogio Ditch [phone] '@2311'$  FH (NOTE) The GeneXpert MRSA Assay (FDA approved for NASAL specimens only), is one component of a comprehensive MRSA colonization surveillance program. It is not intended to diagnose MRSA infection nor to guide or monitor treatment for MRSA infections. Test performance is not FDA approved in patients less than 73 years old. Performed at [facility], [place] [address], [place], [place] [address]          Radiology Studies: DG CHEST PORT 1 VIEW  Result Date: 02/15/2022 CLINICAL DATA:  Sepsis.  Chest tube present. EXAM: PORTABLE CHEST 1 VIEW COMPARISON:  One-view chest x-ray 02/14/2022 FINDINGS: The heart is enlarged. Diffuse interstitial edema has increased. Right pleural effusion is increased slightly. No pneumothorax is present. A small left pleural effusion is stable. Lung volumes have decreased. IMPRESSION: 1. Cardiomegaly with increasing interstitial edema and right pleural effusion. 2. Stable small left pleural effusion. Electronically Signed    By: San Morelle M.D.   On: 02/15/2022 08:11   DG CHEST PORT 1 VIEW  Result Date: 02/14/2022 CLINICAL DATA:  Respiratory distress EXAM: PORTABLE CHEST 1 VIEW COMPARISON:  Chest x-ray dated February 14, 2022 FINDINGS: Cardiac and mediastinal contours are unchanged. Small right pleural effusion with chest tube in place. Stable trace left pleural effusion. Unchanged bilateral heterogeneous opacities. No evidence of pneumothorax. IMPRESSION: 1. Stable small right pleural effusion with chest tube in place. 2. Unchanged bilateral heterogeneous pulmonary opacities. Electronically Signed   By: Yetta Glassman M.D.   On: 02/14/2022 18:14   DG CHEST PORT 1 VIEW  Result Date: 02/14/2022 CLINICAL DATA:  Shortness of breath, cough EXAM: PORTABLE CHEST 1 VIEW COMPARISON:  Previous studies including the examination of 02/12/2022 FINDINGS: Cardiac size is within normal limits. Central pulmonary vessels are less prominent. There is improvement in the aeration in both lungs. Residual increased markings are seen in right parahilar region and both lower lung fields. Small to moderate bilateral pleural effusions are  seen, more so on the right side. Right chest tube is noted. There is no pneumothorax. IMPRESSION: There is slight improvement in the aeration of both lungs suggesting resolving pneumonia or decrease in pulmonary edema. No new focal infiltrates are seen. Small to moderate bilateral pleural effusions, more so on the right side with no significant change. Electronically Signed   By: Elmer Picker M.D.   On: 02/14/2022 13:22   CT CHEST WO CONTRAST  Result Date: 02/13/2022 CLINICAL DATA:  Pneumonia, complications suspected. History of chronic hepatitis C and AIDS. EXAM: CT CHEST WITHOUT CONTRAST TECHNIQUE: Multidetector CT imaging of the chest was performed following the standard protocol without IV contrast. RADIATION DOSE REDUCTION: This exam was performed according to the departmental dose-optimization  program which includes automated exposure control, adjustment of the mA and/or kV according to patient size and/or use of iterative reconstruction technique. COMPARISON:  Chest CT 02/07/2022 and 06/17/2021. Prior chest radiographs, most recently done yesterday. FINDINGS: Cardiovascular: No significant vascular findings on noncontrast imaging. Trace pericardial fluid. The heart size is normal. Mediastinum/Nodes: Numerous residual small mediastinal, hilar and axillary lymph nodes are similar to previous study and although suboptimally evaluated without contrast, are likely reactive. The thyroid gland, trachea and esophagus demonstrate no significant findings. Lungs/Pleura: Interval enlargement of right greater than left pleural effusions. A small caliber pigtail catheter has been placed inferolaterally in the right pleural space. Worsening aeration of both lungs dependently, most consistent with atelectasis. Progressive enlargement of multiple pulmonary nodules bilaterally compared with the recent prior study. In the right upper lobe, there is a 1.6 cm nodule on image 27/5 (previously 1.1 cm). A lingular nodule measuring 2.2 cm on image 65/5 previously measured 1.5 cm, and a left lower lobe nodule measuring 1.6 cm on image 50/5 previously measured 1.1 cm. Cavitation of these nodules is less prominent than previously. Upper abdomen:  No acute findings.  Probable hepatic steatosis. Musculoskeletal/Chest wall: There is no chest wall mass or suspicious osseous finding. IMPRESSION: 1. Enlarging nodularity in both lungs compared with recent prior study of 6 days ago, most consistent with an infectious process (septic emboli or opportunistic infection). The rapidity of change would be unusual for Kaposi sarcoma. 2. Enlarging right greater than left pleural effusions following right pleural drainage catheter placement. 3. Grossly stable thoracic adenopathy, likely reactive. Electronically Signed   By: Richardean Sale M.D.    On: 02/13/2022 15:55        Scheduled Meds:  bictegravir-emtricitabine-tenofovir AF  1 tablet Oral Daily   Chlorhexidine Gluconate Cloth  6 each Topical Daily   enoxaparin (LOVENOX) injection  40 mg Subcutaneous Q24H   fluconazole  200 mg Oral Daily   guaiFENesin  600 mg Oral BID   ketorolac  15 mg Intravenous Q6H   lidocaine  2 patch Transdermal Q24H   polyethylene glycol  17 g Oral BID   senna-docusate  1 tablet Oral BID   sodium chloride flush  10 mL Intrapleural Q8H   sodium chloride flush  10 mL Intrapleural Q8H   sodium chloride flush  3 mL Intravenous Q12H   sulfamethoxazole-trimethoprim  1 tablet Oral Once per day on Mon Wed Fri   Continuous Infusions:  vancomycin Stopped (02/15/22 0955)     LOS: 8 days    Time spent: 35 minutes    Ronnel Zuercher A Zeric Baranowski, MD [facility]   If 7PM-7AM, please contact night-coverage [email or web address]  02/15/2022, 3:26 PM

## 2022-02-15 NOTE — Telephone Encounter (Signed)
Patient sister called to ask if Dr.Vu is able to fill out FMLA paperwork for patient mother missing work due to patient being hospitalized.   Per Dr.Vu that case manager or social worker in the hospital should be able to assist family with FMLA or excusal letter for work.   Informed patient sister - Laverda Sorenson to request to speak with social worker in [place] voiced her understanding.  [place], CMA

## 2022-02-15 NOTE — Progress Notes (Signed)
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, mediastinal/axillary lymphadenopathy, chronic hep c, hx syphilis late latent s/p tx admitted for sepsis found to have LUE cellulitis/abscess, pulm nodules, and mrsa bacteremia   #mrsa septicemia #pulm septic nodules #pleural effusion #left arm cellulitis/abscess tee 8/18 no vegetation serial xray chest showed evolving new pleural effusion right side s/p chest tube placement 8/20; cx ngtd ortho/general surgery evluated left arm and no indication for surgical intervention postive admission bcx 8/15; repeat bcx 8/17 and 8/19 negative  ----- 8/23 assessment 8/21 chest ct post chest tube with bilateral R>L effusion. Severe chest pain Fever had resolved since initiating tpa instillation; also improved pleural output; pain still moderate-severe. Will see how he responds to tpa    -appreciate pulm/ccm team help with chest tube/tpa -continue vanc     #hiv #aids #axillary/mediastinal LAD He has one value of hiv viral load in the 300k after being virologically controlled. Report compliance and I do believe him. He is due for a repeat viral load. He mentioned he missed 1 week as he grabbed a wrong bottle coming to his aunt's house a week ago to help her with chores. His cd4% has been increasing since being on biktarvy LAD likely hiv related -- improved axillary adenopathy on repeat chest ct this admission Repeat viral load this admission 16k in setting missing 1 week art  -will continue bactrim prophy and biktarvy -repeat viral load planned 3-4 weeks from 8/20; if continue to be high will need to send repeat genotype   #hx hep c infection He has positive hep c lab 05/2021 but repeat viral load (hcv genotype testing) 08/2021 was negative. Repeat hep c viral  load this admission negative. Likely cleared it spontaneously    #s/p late latent syphilis tx and rpr improving appropriately   #oral thrush Primary team conconcerned about oral thrush. Ok with 14 days oral fluconazole 200 mg daily  -agree with 2 weeks oral fluconazole 200 mg daily until 9/4   I spent more than 35 minute reviewing data/chart, and coordinating care and >50% direct face to face time providing counseling/discussing diagnostics/treatment plan with patient    Principal Problem:   Severe sepsis ([place]) Active Problems:   AIDS (acquired immune deficiency syndrome) ([place])   Oral thrush   Hyponatremia   Acute respiratory failure with hypoxia ([place])   Acute septic pulmonary embolism without acute cor pulmonale (HCC)   Cutaneous abscess of left upper extremity   Pleural effusion   Allergies  Allergen Reactions   Banana     Other reaction(s): hives    Scheduled Meds:  bictegravir-emtricitabine-tenofovir AF  1 tablet Oral Daily   Chlorhexidine Gluconate Cloth  6 each Topical Daily   enoxaparin (LOVENOX) injection  40 mg Subcutaneous Q24H   fluconazole  200 mg Oral Daily   guaiFENesin  600 mg Oral BID   ketorolac  15 mg Intravenous Q6H   lidocaine  2 patch Transdermal Q24H   polyethylene glycol  17 g Oral BID   senna-docusate  1 tablet Oral BID   sodium chloride flush  10 mL Intrapleural Q8H   sodium chloride flush  10 mL Intrapleural Q8H   sodium chloride flush  3 mL Intravenous Q12H   sulfamethoxazole-trimethoprim  1 tablet Oral Once per day on Mon Wed Fri   Continuous Infusions:  vancomycin Stopped (02/15/22 0955)   PRN Meds:.acetaminophen **OR** acetaminophen, bisacodyl, hydrALAZINE, HYDROmorphone (DILAUDID) injection, hydrOXYzine, ipratropium-albuterol, metoprolol tartrate, naLOXone (NARCAN)  injection, ondansetron **OR** ondansetron (ZOFRAN) IV, mouth rinse, oxyCODONE, traZODone   SUBJECTIVE: Afebrile Transferred to icu yesterday due to increased chest  pain, tachypnea  Had had 2 doses tpa and improved chest tube output and symptoms this morning, will transfer out of icu today    Review of Systems: ROS All other ROS was negative, except mentioned above     OBJECTIVE: Vitals:   02/15/22 1000 02/15/22 1100 02/15/22 1123 02/15/22 1130  BP: 101/63 (!) 106/58    Pulse: 89 95    Resp: 11 12    Temp:   (!) 97.5 F (36.4 C)   TempSrc:   Oral   SpO2: 98% 96%  94%  Weight:      Height:       Body mass index is 23.77 kg/m.  Physical Exam  General: no distress, conversant HEENT: conj clear; normocephalic Cv: rrr no mrg Lungs: normal respiratory effort; chest tube functioning; no leak in ct Abd s/nt Ext: no edema Neuro: nonfocal Skin: no active cellulitis   Lab Results Lab Results  Component Value Date   WBC 10.7 (H) 02/15/2022   HGB 10.5 (L) 02/15/2022   HCT 31.5 (L) 02/15/2022   MCV 82.9 02/15/2022   PLT 434 (H) 02/15/2022    Lab Results  Component Value Date   CREATININE 0.73 02/15/2022   BUN 17 02/15/2022   NA 131 (L) 02/15/2022   K 4.3 02/15/2022   CL 98 02/15/2022   CO2 25 02/15/2022    Lab Results  Component Value Date   ALT 20 02/10/2022   AST 18 02/10/2022   ALKPHOS 61 02/10/2022   BILITOT 0.6 02/10/2022      Microbiology: Recent Results (from the past 240 hour(s))  Blood Culture (routine x 2)     Status: Abnormal   Collection Time: 02/07/22  5:01 PM   Specimen: BLOOD RIGHT HAND  Result Value Ref Range Status   Specimen Description   Final    BLOOD RIGHT HAND Performed at [facility], [address], [place], [place] [address]    Special Requests   Final    BOTTLES DRAWN AEROBIC AND ANAEROBIC Blood Culture results may not be optimal due to an inadequate volume of blood received in culture bottles Performed at [facility], [address] [place], [place] [address]    Culture  Setup Time   Final    GRAM POSITIVE COCCI BOTTLES DRAWN AEROBIC AND ANAEROBIC Gram Stain Report Called to,Read  Back By and Verified With: GUILBEAULT,D '@0745'$  BY MATTHEWS, B 8.16.2023 IN BOTH AEROBIC AND ANAEROBIC BOTTLES    Culture (A)  Final    STAPHYLOCOCCUS AUREUS SUSCEPTIBILITIES PERFORMED ON PREVIOUS CULTURE WITHIN THE LAST 5 DAYS. Performed at [facility], [place] [address], [place], [place] [address]    Report Status 02/10/2022 FINAL  Final  Blood Culture (routine x 2)     Status: Abnormal   Collection Time: 02/07/22  5:01 PM   Specimen: BLOOD RIGHT ARM  Result Value Ref Range Status   Specimen Description   Final  BLOOD RIGHT ARM Performed at [facility], [address] [place], [place] [address]    Special Requests   Final    BOTTLES DRAWN AEROBIC AND ANAEROBIC Blood Culture results may not be optimal due to an inadequate volume of blood received in culture bottles Performed at [place] [place], [place] [address]    Culture  Setup Time   Final    GRAM POSITIVE COCCI BOTTLES DRAWN AEROBIC AND ANAEROBIC Gram Stain Report Called to,Read Back By and Verified With: GUILBEAULT,D'@0745'$  BY MATTHEWS, B 8.16.2023 IN BOTH AEROBIC AND ANAEROBIC BOTTLES GRAM STAIN REVIEWED-AGREE WITH RESULT CRITICAL RESULT CALLED TO, READ BACK BY AND VERIFIED WITH: Noemi Chapel [id number] AT 1411 BY EC Performed at [facility], [place] [address], [place], [place] [address]    Culture METHICILLIN RESISTANT STAPHYLOCOCCUS AUREUS (A)  Final   Report Status 02/10/2022 FINAL  Final   Organism ID, Bacteria METHICILLIN RESISTANT STAPHYLOCOCCUS AUREUS  Final      Susceptibility   Methicillin resistant staphylococcus aureus - MIC*    CIPROFLOXACIN >=8 RESISTANT Resistant     ERYTHROMYCIN >=8 RESISTANT Resistant     GENTAMICIN <=0.5 SENSITIVE Sensitive     OXACILLIN >=4 RESISTANT Resistant     TETRACYCLINE >=16 RESISTANT Resistant     VANCOMYCIN <=0.5 SENSITIVE Sensitive     TRIMETH/SULFA >=320 RESISTANT Resistant     CLINDAMYCIN <=0.25 SENSITIVE Sensitive     RIFAMPIN <=0.5  SENSITIVE Sensitive     Inducible Clindamycin NEGATIVE Sensitive     * METHICILLIN RESISTANT STAPHYLOCOCCUS AUREUS  Blood Culture ID Panel (Reflexed)     Status: Abnormal   Collection Time: 02/07/22  5:01 PM  Result Value Ref Range Status   Enterococcus faecalis NOT DETECTED NOT DETECTED Final   Enterococcus Faecium NOT DETECTED NOT DETECTED Final   Listeria monocytogenes NOT DETECTED NOT DETECTED Final   Staphylococcus species DETECTED (A) NOT DETECTED Final    Comment: CRITICAL RESULT CALLED TO, READ BACK BY AND VERIFIED WITH: PHARMD Alycia Rossetti [id number] AT 1411 BY EC    Staphylococcus aureus (BCID) DETECTED (A) NOT DETECTED Final    Comment: Methicillin (oxacillin)-resistant Staphylococcus aureus (MRSA). MRSA is predictably resistant to beta-lactam antibiotics (except ceftaroline). Preferred therapy is vancomycin unless clinically contraindicated. Patient requires contact precautions if  hospitalized. CRITICAL RESULT CALLED TO, READ BACK BY AND VERIFIED WITH: Ali Chuk [id number] AT 4742 BY EC    Staphylococcus epidermidis NOT DETECTED NOT DETECTED Final   Staphylococcus lugdunensis NOT DETECTED NOT DETECTED Final   Streptococcus species NOT DETECTED NOT DETECTED Final   Streptococcus agalactiae NOT DETECTED NOT DETECTED Final   Streptococcus pneumoniae NOT DETECTED NOT DETECTED Final   Streptococcus pyogenes NOT DETECTED NOT DETECTED Final   A.calcoaceticus-baumannii NOT DETECTED NOT DETECTED Final   Bacteroides fragilis NOT DETECTED NOT DETECTED Final   Enterobacterales NOT DETECTED NOT DETECTED Final   Enterobacter cloacae complex NOT DETECTED NOT DETECTED Final   Escherichia coli NOT DETECTED NOT DETECTED Final   Klebsiella aerogenes NOT DETECTED NOT DETECTED Final   Klebsiella oxytoca NOT DETECTED NOT DETECTED Final   Klebsiella pneumoniae NOT DETECTED NOT DETECTED Final   Proteus species NOT DETECTED NOT DETECTED Final   Salmonella species NOT DETECTED NOT  DETECTED Final   Serratia marcescens NOT DETECTED NOT DETECTED Final   Haemophilus influenzae NOT DETECTED NOT DETECTED Final   Neisseria meningitidis NOT DETECTED NOT DETECTED Final   Pseudomonas aeruginosa NOT DETECTED NOT DETECTED Final   Stenotrophomonas maltophilia  NOT DETECTED NOT DETECTED Final   Candida albicans NOT DETECTED NOT DETECTED Final   Candida auris NOT DETECTED NOT DETECTED Final   Candida glabrata NOT DETECTED NOT DETECTED Final   Candida krusei NOT DETECTED NOT DETECTED Final   Candida parapsilosis NOT DETECTED NOT DETECTED Final   Candida tropicalis NOT DETECTED NOT DETECTED Final   Cryptococcus neoformans/gattii NOT DETECTED NOT DETECTED Final   Meth resistant mecA/C and MREJ DETECTED (A) NOT DETECTED Final    Comment: CRITICAL RESULT CALLED TO, READ BACK BY AND VERIFIED WITH: Noemi Chapel [id number] AT 1411 BY EC Performed at [facility], [place] [address] [place], [place] [address]   Resp Panel by RT-PCR (Flu A&B, Covid) Anterior Nasal Swab     Status: None   Collection Time: 02/07/22  5:20 PM   Specimen: Anterior Nasal Swab  Result Value Ref Range Status   SARS Coronavirus 2 by RT PCR NEGATIVE NEGATIVE Final    Comment: (NOTE) SARS-CoV-2 target nucleic acids are NOT DETECTED.  The SARS-CoV-2 RNA is generally detectable in upper respiratory specimens during the acute phase of infection. The lowest concentration of SARS-CoV-2 viral copies this assay can detect is 138 copies/mL. A negative result does not preclude SARS-Cov-2 infection and should not be used as the sole basis for treatment or other patient management decisions. A negative result may occur with  improper specimen collection/handling, submission of specimen other than nasopharyngeal swab, presence of viral mutation(s) within the areas targeted by this assay, and inadequate number of viral copies(<138 copies/mL). A negative result must be combined with clinical observations,  patient history, and epidemiological information. The expected result is Negative.  Fact Sheet for Patients:  EntrepreneurPulse.com.au  Fact Sheet for Healthcare Providers:  IncredibleEmployment.be  This test is no t yet approved or cleared by the [place] FDA and  has been authorized for detection and/or diagnosis of SARS-CoV-2 by FDA under an Emergency Use Authorization (EUA). This EUA will remain  in effect (meaning this test can be used) for the duration of the COVID-19 declaration under Section 564(b)(1) of the Act, 21 U.S.C.section 360bbb-3(b)(1), unless the authorization is terminated  or revoked sooner.       Influenza A by PCR NEGATIVE NEGATIVE Final   Influenza B by PCR NEGATIVE NEGATIVE Final    Comment: (NOTE) The Xpert Xpress SARS-CoV-2/FLU/RSV plus assay is intended as an aid in the diagnosis of influenza from Nasopharyngeal swab specimens and should not be used as a sole basis for treatment. Nasal washings and aspirates are unacceptable for Xpert Xpress SARS-CoV-2/FLU/RSV testing.  Fact Sheet for Patients: EntrepreneurPulse.com.au  Fact Sheet for Healthcare Providers: IncredibleEmployment.be  This test is not yet approved or cleared by the [place] FDA and has been authorized for detection and/or diagnosis of SARS-CoV-2 by FDA under an Emergency Use Authorization (EUA). This EUA will remain in effect (meaning this test can be used) for the duration of the COVID-19 declaration under Section 564(b)(1) of the Act, 21 U.S.C. section 360bbb-3(b)(1), unless the authorization is terminated or revoked.  Performed at [facility], [address] [place], [place] [address]   Urine Culture     Status: None   Collection Time: 02/07/22 11:32 PM   Specimen: In/Out Cath Urine  Result Value Ref Range Status   Specimen Description IN/OUT CATH URINE  Final   Special Requests NONE  Final    Culture   Final    NO GROWTH Performed at [facility], [place]  [address], [place], [place] [address]    Report Status 02/08/2022 FINAL  Final  Culture, blood (Routine X 2) w Reflex to ID Panel     Status: None   Collection Time: 02/09/22  4:32 AM   Specimen: BLOOD RIGHT HAND  Result Value Ref Range Status   Specimen Description BLOOD RIGHT HAND  Final   Special Requests   Final    AEROBIC BOTTLE ONLY Blood Culture results may not be optimal due to an excessive volume of blood received in culture bottles   Culture   Final    NO GROWTH 5 DAYS Performed at [facility], [place] [address] [place], [place] [address]    Report Status 02/14/2022 FINAL  Final  Culture, blood (Routine X 2) w Reflex to ID Panel     Status: None   Collection Time: 02/09/22  4:32 AM   Specimen: BLOOD LEFT HAND  Result Value Ref Range Status   Specimen Description BLOOD LEFT HAND  Final   Special Requests   Final    AEROBIC BOTTLE ONLY Blood Culture results may not be optimal due to an excessive volume of blood received in culture bottles   Culture   Final    NO GROWTH 5 DAYS Performed at [facility], [place] [address], [place], [place] [address]    Report Status 02/14/2022 FINAL  Final  Culture, blood (Routine X 2) w Reflex to ID Panel     Status: None (Preliminary result)   Collection Time: 02/11/22  3:49 AM   Specimen: BLOOD LEFT HAND  Result Value Ref Range Status   Specimen Description BLOOD LEFT HAND  Final   Special Requests   Final    BOTTLES DRAWN AEROBIC ONLY Blood Culture results may not be optimal due to an inadequate volume of blood received in culture bottles   Culture   Final    NO GROWTH 4 DAYS Performed at [facility], [place] [address] [place], [place] [address]    Report Status PENDING  Incomplete  Culture, blood (Routine X 2) w Reflex to ID Panel     Status: None (Preliminary result)   Collection Time: 02/11/22  3:57 AM   Specimen: BLOOD RIGHT HAND  Result Value Ref  Range Status   Specimen Description BLOOD RIGHT HAND  Final   Special Requests   Final    BOTTLES DRAWN AEROBIC ONLY Blood Culture results may not be optimal due to an inadequate volume of blood received in culture bottles   Culture   Final    NO GROWTH 4 DAYS Performed at [facility], [place] [address] [place], [place] [address]    Report Status PENDING  Incomplete  Body fluid culture w Gram Stain     Status: None (Preliminary result)   Collection Time: 02/12/22  4:46 PM   Specimen: Pleural Fluid  Result Value Ref Range Status   Specimen Description FLUID  Final   Special Requests Immunocompromised  Final   Gram Stain NO WBC SEEN NO ORGANISMS SEEN   Final   Culture   Final    NO GROWTH 3 DAYS Performed at [facility], [address], [place], [place] [address]    Report Status PENDING  Incomplete  Fungus Culture With Stain     Status: None (Preliminary result)   Collection Time: 02/12/22  4:46 PM   Specimen: Pleural Fluid  Result Value Ref Range Status   Fungus Stain Final report  Final    Comment: (NOTE) Performed At: [facility] [place], [place] [id number] Rush Farmer MD QJ:[id number]    Fungus (Mycology) Culture PENDING  Incomplete   Fungal Source FLUID  Final    Comment: Performed at [facility], [place] [address], [place], [place] [address]  Fungus Culture Result     Status: None   Collection Time: 02/12/22  4:46 PM  Result Value Ref Range Status   Result 1 Comment  Final    Comment: (NOTE) KOH/Calcofluor preparation:  no fungus observed. Performed At: [facility] [place], [place] [id number] Rush Farmer MD VZ:[id number]   MRSA Next Gen by PCR, Nasal     Status: Abnormal   Collection Time: 02/14/22  7:22 PM   Specimen: Nasal Mucosa; Nasal Swab  Result Value Ref Range Status   MRSA by PCR Next Gen DETECTED (A) NOT DETECTED Final    Comment: RESULT CALLED TO, READ BACK BY AND VERIFIED WITH: RN  Eulogio Ditch [phone] '@2311'$  FH (NOTE) The GeneXpert MRSA Assay (FDA approved for NASAL specimens only), is one component of a comprehensive MRSA colonization surveillance program. It is not intended to diagnose MRSA infection nor to guide or monitor treatment for MRSA infections. Test performance is not FDA approved in patients less than 23 years old. Performed at [facility], [place] [address], [place], [place] [address]      Serology:   Imaging: If present, new imagings (plain films, ct scans, and mri) have been personally visualized and interpreted; radiology reports have been reviewed. Decision making incorporated into the Impression / Recommendations.   8/22 chest ct 1. Severe cellulitis of the upper arm, with two small 1.5 cm superficial abscesses in the medial proximal upper arm and lateral chest wall/axilla.   8/16 tte  1. Left ventricular ejection fraction, by estimation, is 65 to 70%. The  left ventricle has normal function. The left ventricle has no regional  wall motion abnormalities. Left ventricular diastolic parameters were  normal.   2. Right ventricular systolic function is normal. The right ventricular  size is normal.   3. The mitral valve is normal in structure. No evidence of mitral valve  regurgitation. No evidence of mitral stenosis.   4. The aortic valve has an indeterminant number of cusps. Aortic valve  regurgitation is not visualized. No aortic stenosis is present.   5. The inferior vena cava is normal in size with greater than 50%  respiratory variability, suggesting right atrial pressure of 3 mmHg.    8/16 mri left humerus Soft tissue Severe circumferential soft tissue swelling and enhancement of the upper arm. There is a small 1.5 x 1.0 x 1.6 cm rim enhancing fluid collection in the medial proximal upper arm just deep to the skin surface (series 11, image 20). There is a second slightly smaller 1.5 x 0.6 x 1.2 cm rim enhancing fluid  collection in the lateral chest wall/axilla (series 11, image 21).   Multifocal patchy airspace disease in the left lung.   IMPRESSION: 1. Severe cellulitis of the upper arm, with two small 1.5 cm superficial abscesses in the medial proximal upper arm and lateral chest wall/axilla.   8/15 ct chest with contrast 1. Innumerable pulmonary nodules scattered throughout both lungs, several of which are cavitated. Differential considerations include septic pulmonary emboli as well as atypical bacterial or fungal pneumonia given history of HIV. 2. Asymmetric nodular skin thickening and subcutaneous edema in the left axilla, concerning  for cellulitis. No discrete abscess. 3. Trace bilateral pleural effusions.    Jabier Mutton, [facility] Infectious Disease [place] [phone] pager    02/15/2022, 1:45 PM

## 2022-02-15 NOTE — Procedures (Signed)
Pleural Fibrinolytic Administration Procedure Note  NOX TALENT  [id number]  1991-01-06  Date:02/15/22  Time:11:45 AM   Provider Performing:Jazon Jipson B Lauretta Sallas   Procedure: Pleural Fibrinolysis Subsequent day [phone])  Indication(s) Fibrinolysis of complicated pleural effusion  Consent Risks of the procedure as well as the alternatives and risks of each were explained to the patient and/or caregiver.  Consent for the procedure was obtained.   Anesthesia None   Time Out Verified patient identification, verified procedure, site/side was marked, verified correct patient position, special equipment/implants available, medications/allergies/relevant history reviewed, required imaging and test results available.   Sterile Technique Hand hygiene, gloves   Procedure Description Existing pleural catheter was cleaned and accessed in sterile manner.  '10mg'$  of tPA in 30cc of saline and '5mg'$  of dornase in 30cc of sterile water were injected into pleural space using existing pleural catheter.  Catheter will be clamped for 1 hour and then placed back to suction.   Complications/Tolerance None; patient tolerated the procedure well.  EBL None   Specimen(s) None

## 2022-02-15 NOTE — Progress Notes (Signed)
eLink Physician-Brief Progress Note Patient Name: Rick Lam DOB: 25-Mar-1991 MRN: [id number]   Date of Service  02/15/2022  HPI/Events of Note  Nursing reports 1700 mL output form chest tube since shift change. Patient had fibrinolytics instilled into chest tube today for loculated pleural effusion. Sat = 93% on 3 L/min [place]. BP = 112/73.  eICU Interventions  Plan: Portable CXR in AM. Otherwise, continue present management.     Intervention Category Major Interventions: Other:  Lysle Dingwall 02/15/2022, 9:51 PM

## 2022-02-15 NOTE — Progress Notes (Signed)
   NAME:  Rick Lam, MRN:  [id number], DOB:  06-May-1991, LOS: 8 ADMISSION DATE:  02/07/2022, CONSULTATION DATE:  02/07/2022 REFERRING MD:  Roderic Palau, CHIEF COMPLAINT:  fever, chest discomfort, weakness   History of Present Illness:  Rick Lam is a 31 year old male with HIV/AIDS on biktarvy, chronic hepatitis C, latent syphillis s/p treatment who presented with painful left axillary nodules with erythema, found to have sepsis with MRSA bacteremia along with axillary abscess, cellulitis and septic pulmonary emboli.   TEE 8/18 negative for vegetations. ID is following and patient has been on vancomycin. PCCM consulted due to increasing respiratory distress and new right pleural effusion.  Pertinent Medical History:  HIV/AIDS [place] Late latent syphilis, treated Chronic HCV  Significant Hospital Events: Including procedures, antibiotic start and stop dates in addition to other pertinent events   02/06/22 transfer from [facility] ED, started on vanc/cefepime 8/15 - CT Chest with innumerable pulmonary nodules bilaterally, several cavitated; asymmetrical skin thickening/SQ edema in L axilla c/f cellulitis. PCCM consulted. 8/16 - TTE with EF 65-70%, LE dopplers negative for DVT. MRI L humerus with severe upper arm cellulitis, two small 1.5cm abscesses. ID consulted 8/17 - Ortho consult for cellulitis, conservative management with IV abx. BCx positive for MRSA, vanc-sensitive. 8/18 - TEE with Cards, r/o vegetation. Persistent pain. Cellulitis/erythema improving. 8/20 - chest tube placed  8/21 CT of the chest 8/22 first dose of TPA/DNAse with 889m put of of chest tube  Interim History / Subjective:   Feeling better today, pain is better controlled. Breathing feels more comfortable.  Objective:  Blood pressure (!) 110/54, pulse 98, temperature 98.4 F (36.9 C), temperature source Oral, resp. rate 10, height 6' (1.829 m), weight 79.5 kg, SpO2 95 %.        Intake/Output Summary (Last 24 hours)  at 02/15/2022 0850 Last data filed at 02/15/2022 0800 Gross per 24 hour  Intake 3278.9 ml  Output 1290 ml  Net 1988.9 ml   Filed Weights   02/13/22 0428 02/14/22 0123 02/15/22 0500  Weight: 78.3 kg 76.1 kg 79.5 kg    Physical Examination: General: no acute distress, ill appearing male HEENT: MM pink/moist no JVD or lymphadenopathy is appreciated Neuro: Grossly intact without focal defect CV: Heart sounds are regular PULM: Diminished throughout Right chest tube with serosanguinous drainage GI: soft, bsx4 active  GU: Voids Extremities: warm/dry, negative edema , axillary regions remain erythemic   Assessment & Plan:   Sepsis due to MRSA Bacteremia, septic pulmonary emboli and left arm abscess/cellulitis Acute Hypoxemic Respiratory Failure Right Pleural Effusion HIV/AIDS  Plan: - Status post second dose of pleural lytic therapy today - Continue chest tube to suction - Monitor daily chest x-ray - Will determine need for third dose of lytics on 8/24  JFreda Jackson MD LFort SumnerOffice: [phone]  See Amion for personal pager PCCM on call pager [phone] 7pm. Please call Elink 7p-7a. [phone]

## 2022-02-15 NOTE — Progress Notes (Addendum)
eLink Physician-Brief Progress Note Patient Name: Rick Lam DOB: 16-Jul-1990 MRN: [id number]   Date of Service  02/15/2022  HPI/Events of Note  Patient with increased shortness of breath and desaturation after being re-positioned, stat portable CXR ordered.  eICU Interventions  CXR shows worsened infiltrates on the right-encourage aggressive lung expansion Rx .        Kerry Kass Stesha Neyens 02/15/2022, 6:04 AM

## 2022-02-16 ENCOUNTER — Inpatient Hospital Stay (HOSPITAL_COMMUNITY): Payer: Self-pay

## 2022-02-16 LAB — CULTURE, BLOOD (ROUTINE X 2)
Culture: NO GROWTH
Culture: NO GROWTH

## 2022-02-16 LAB — BODY FLUID CULTURE W GRAM STAIN
Culture: NO GROWTH
Gram Stain: NONE SEEN

## 2022-02-16 LAB — BASIC METABOLIC PANEL
Anion gap: 9 (ref 5–15)
BUN: 15 mg/dL (ref 6–20)
CO2: 25 mmol/L (ref 22–32)
Calcium: 7.5 mg/dL — ABNORMAL LOW (ref 8.9–10.3)
Chloride: 97 mmol/L — ABNORMAL LOW (ref 98–111)
Creatinine, Ser: 0.69 mg/dL (ref 0.61–1.24)
GFR, Estimated: >60 mL/min (ref >60)
Glucose, Bld: 132 mg/dL — ABNORMAL HIGH (ref 70–99)
Potassium: 4.4 mmol/L (ref 3.5–5.1)
Sodium: 131 mmol/L — ABNORMAL LOW (ref 135–145)

## 2022-02-16 LAB — CBC
HCT: 32.3 % — ABNORMAL LOW (ref 39.0–52.0)
Hemoglobin: 10.5 g/dL — ABNORMAL LOW (ref 13.0–17.0)
MCH: 27.2 pg (ref 26.0–34.0)
MCHC: 32.5 g/dL (ref 30.0–36.0)
MCV: 83.7 fL (ref 80.0–100.0)
Platelets: 440 10*3/uL — ABNORMAL HIGH (ref 150–400)
RBC: 3.86 MIL/uL — ABNORMAL LOW (ref 4.22–5.81)
RDW: 16.4 % — ABNORMAL HIGH (ref 11.5–15.5)
WBC: 11.4 10*3/uL — ABNORMAL HIGH (ref 4.0–10.5)
nRBC: 0 % (ref 0.0–0.2)

## 2022-02-16 MED ORDER — SODIUM CHLORIDE (PF) 0.9 % IJ SOLN
10.0000 mg | Freq: Once | INTRAMUSCULAR | Status: AC
  Administered 2022-02-16: 10 mg via INTRAPLEURAL
  Filled 2022-02-16: qty 10

## 2022-02-16 MED ORDER — STERILE WATER FOR INJECTION IJ SOLN
5.0000 mg | Freq: Once | RESPIRATORY_TRACT | Status: AC
  Administered 2022-02-16: 5 mg via INTRAPLEURAL
  Filled 2022-02-16: qty 5

## 2022-02-16 MED ORDER — SODIUM CHLORIDE 0.9% FLUSH
10.0000 mL | Freq: Three times a day (TID) | INTRAVENOUS | Status: DC
  Administered 2022-02-17 – 2022-02-19 (×6): 10 mL via INTRAPLEURAL

## 2022-02-16 NOTE — Progress Notes (Signed)
   NAME:  Rick Lam, MRN:  [id number], DOB:  12-10-1990, LOS: 9 ADMISSION DATE:  02/07/2022, CONSULTATION DATE:  02/07/2022 REFERRING MD:  Roderic Palau, CHIEF COMPLAINT:  fever, chest discomfort, weakness   History of Present Illness:  Rick Lam is a 31 year old male with HIV/AIDS on biktarvy, chronic hepatitis C, latent syphillis s/p treatment who presented with painful left axillary nodules with erythema, found to have sepsis with MRSA bacteremia along with axillary abscess, cellulitis and septic pulmonary emboli.   TEE 8/18 negative for vegetations. ID is following and patient has been on vancomycin. PCCM consulted due to increasing respiratory distress and new right pleural effusion.  Pertinent Medical History:  HIV/AIDS [place] Late latent syphilis, treated Chronic HCV  Significant Hospital Events: Including procedures, antibiotic start and stop dates in addition to other pertinent events   02/06/22 transfer [facility] ED, started on vanc/cefepime 8/15 - CT Chest with innumerable pulmonary nodules bilaterally, several cavitated; asymmetrical skin thickening/SQ edema in L axilla c/f cellulitis. PCCM consulted. 8/16 - TTE with EF 65-70%, LE dopplers negative for DVT. MRI L humerus with severe upper arm cellulitis, two small 1.5cm abscesses. ID consulted 8/17 - Ortho consult for cellulitis, conservative management with IV abx. BCx positive for MRSA, vanc-sensitive. 8/18 - TEE with Cards, r/o vegetation. Persistent pain. Cellulitis/erythema improving. 8/20 - chest tube placed  8/21 CT of the chest 8/22 first dose of TPA/DNAse with 850m put of of chest tube  Interim History / Subjective:   He is feeling ok this morning Chest tube out put 1.8L yesterday  Objective:  Blood pressure (!) 105/46, pulse (!) 108, temperature 98.8 F (37.1 C), temperature source Oral, resp. rate (!) 24, height 6' (1.829 m), weight 75.2 kg, SpO2 93 %.        Intake/Output Summary (Last 24 hours) at  02/16/2022 0945 Last data filed at 02/16/2022 0800 Gross per 24 hour  Intake 1105.38 ml  Output 3035 ml  Net -1929.62 ml   Filed Weights   02/14/22 0123 02/15/22 0500 02/16/22 0500  Weight: 76.1 kg 79.5 kg 75.2 kg   Physical Examination: General: no acute distress, ill appearing male HEENT: MM pink/moist no JVD or lymphadenopathy is appreciated Neuro: Grossly intact without focal defect CV: Heart sounds are regular PULM: Diminished throughout Right chest tube with serosanguinous drainage GI: soft, bsx4 active  GU: Voids Extremities: warm/dry, negative edema , axillary regions remain erythemic  Assessment & Plan:   Sepsis due to MRSA Bacteremia, septic pulmonary emboli and left arm abscess/cellulitis Acute Hypoxemic Respiratory Failure Right Pleural Effusion HIV/AIDS  Plan: - Will give third dose of pleural lytic therapy - Continue chest tube to suction - Monitor daily chest x-ray - PCCM will continue to follow  JFreda Jackson MD LBradleyOffice: [phone]  See Amion for personal pager PCCM on call pager [phone] 7pm. Please call Elink 7p-7a. [phone]

## 2022-02-16 NOTE — TOC Initial Note (Signed)
Transition of Care [facility]) - Initial/Assessment Note    Patient Details  Name: Rick Lam MRN: [id number] Date of Birth: January 08, 1991  Transition of [facility]) CM/SW Contact:    Ninfa Meeker, RN Phone Number: 02/16/2022, 10:51 AM  Clinical Narrative:                 Transition of Care Screening Note: Transition of Care Department [facility]) has reviewed patient and no TOC needs have been identified at this time. We will continue to monitor patient advancement through Interdisciplinary progressions. If new patient transition needs arise, please place a consult.          Patient Goals and CMS Choice        Expected Discharge Plan and Services                                                Prior Living Arrangements/Services                       Activities of Daily Living Home Assistive Devices/Equipment: None ADL Screening (condition at time of admission) Patient's cognitive ability adequate to safely complete daily activities?: Yes Is the patient deaf or have difficulty hearing?: No Does the patient have difficulty seeing, even when wearing glasses/contacts?: No Does the patient have difficulty concentrating, remembering, or making decisions?: No Patient able to express need for assistance with ADLs?: Yes Does the patient have difficulty dressing or bathing?: No Independently performs ADLs?: Yes (appropriate for developmental age) Does the patient have difficulty walking or climbing stairs?: No Weakness of Legs: None Weakness of Arms/Hands: None  Permission Sought/Granted                  Emotional Assessment              Admission diagnosis:  Cellulitis of left arm [L03.114] Sepsis ([place]) [A41.9] Sepsis with acute hypoxic respiratory failure, due to unspecified organism, unspecified whether septic shock present ([place]) [A41.9, R65.20, J96.01] Patient Active Problem List   Diagnosis Date Noted   Pleural effusion    Acute septic  pulmonary embolism without acute cor pulmonale ([place])    Cutaneous abscess of left upper extremity    Acute respiratory failure with hypoxia ([place]) 02/08/2022   Cellulitis of left arm    Multifocal pneumonia    MRSA bacteremia    Severe sepsis ([address]) 02/07/2022   Hyponatremia 02/07/2022   Chronic hepatitis C without hepatic coma ([place]) 07/06/2021   AIDS (acquired immune deficiency syndrome) ([place]) 06/01/2021   Oral thrush 06/01/2021   Rash 06/01/2021   Syphilis 06/01/2021   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 Pharmacy:   [facility] [place] [place] [address] Phone: [phone] Fax: [phone]     Social Determinants of Health (SDOH) Interventions    Readmission Risk Interventions     No data to display

## 2022-02-16 NOTE — Plan of Care (Signed)
Rick Roggenkamp, RN 

## 2022-02-16 NOTE — Progress Notes (Signed)
PROGRESS NOTE    Rick Lam  WCB:[id number] DOB: May 14, 1991 DOA: 02/07/2022 PCP: No primary care provider on file.   Brief Narrative: 31 year old with past medical history significant for HIV/AIDS, recent CD4 38, chronic hepatitis C, latent syphilis that has been treated previously.  Presented with painful left axillary nodules with surrounding erythema he was admitted with sepsis, found to have MRSA bacteremia secondary to axillary abscess and cellulitis.  He underwent TEE 8/18 which was negative for endocarditis.  ID is following.  Patient developed worsening respiratory distress 8/20.  He was found to have a large right-sided pleural effusion, empyema.  Chest tube was placed.  Transfer to ICU 8/22 secondary to worsening respiratory failure in the setting of pigtail kink.     Assessment & Plan:   Principal Problem:   Severe sepsis ([place]) Active Problems:   AIDS (acquired immune deficiency syndrome) (HCC)   Oral thrush   Hyponatremia   Acute respiratory failure with hypoxia (HCC)   Acute septic pulmonary embolism without acute cor pulmonale (HCC)   Cutaneous abscess of left upper extremity   Pleural effusion   1-Sepsis secondary to MRSA bacteremia,  bilateral likely pulmonary septic emboli, Left arm cellulitis and abscess, Empyema -Patient immunosuppressed, in the setting of HIV/AIDS. -Blood cultures from 8/15 positive for MRSA. -Repeated blood culture 8/17 no growth to date. Blood culture 8/19: No growth to date.  -MRI left upper extremity with a small abscess x2 evaluated by Ortho, did not recommend drainage at that time. -TEE 8/18 negative for endocarditis -Pulmonology recommended repeat CT chest at the end of treatment for MRSA if lesions persist consider outpatient bronchoscopy. -Continue with IV vancomycin. -Surgery consulted for further evaluation of axillary abscess on 8/19. Surgery recommend warm compress, IV antibiotics, no need for drainage.   Acute hypoxic  respiratory failure: Bilateral pulmonary nodules, concern with septic emboli Right side Empyema:  Continue with oxygen supplementation.  Incentive spirometry/ Flutter Valve S/P pigtail catheter placement right  8/20. CCM following,  Third dose of lytics through catheter 8/24. Pleural fluid, no growth to date.  Chest x ray: 8/24 PICC detailed right chest tube at lower lung, interval decrease of right pleural effusion.  Interval improvement of interstitial edema as well as the patchy bilateral opacity.  HIV/AIDS: Continue with Biktarvy and Bactrim ID following, appreciate assistance  Hyponatremia: Could be combination of dehydration and some component of SIADH Sodium improved with hydration.  AKI: Resolved Oral thrush; odynophagia; On IV diflucan.   Estimated body mass index is 22.48 kg/m as calculated from the following:   Height as of this encounter: 6' (1.829 m).   Weight as of this encounter: 75.2 kg.   DVT prophylaxis: Lovenox Code Status: Full code Family Communication: Care discussed with patient and mother at bedside. /8/23.  Disposition Plan:  Status is: Inpatient Remains inpatient appropriate because: Continue with management of MRSA bacteremia, axillary abscess, bilateral pulmonary septic emboli. Empyema/     Consultants:  ID Surgery   Procedures:  TEE 8/18 negative for endocarditis.   Antimicrobials:  Vancomycin   Subjective: He is breathing better, swelling arm, axilla decreasing.    Objective: Vitals:   02/16/22 0500 02/16/22 0600 02/16/22 0700 02/16/22 0732  BP: (!) 108/56 113/62 (!) 122/53   Pulse: (!) 115 (!) 106 (!) 112   Resp:      Temp:    98.8 F (37.1 C)  TempSrc:    Oral  SpO2: 94% 93% 93%   Weight: 75.2 kg  Height:        Intake/Output Summary (Last 24 hours) at 02/16/2022 0828 Last data filed at 02/16/2022 0600 Gross per 24 hour  Intake 1265.98 ml  Output 3035 ml  Net -1769.02 ml    Filed Weights   02/14/22 0123 02/15/22  0500 02/16/22 0500  Weight: 76.1 kg 79.5 kg 75.2 kg    Examination:  General exam: NAD Respiratory system: BL ronchus, chest tube right Cardiovascular system: S 1, S 2 RRR Gastrointestinal system: BS present, soft, nt Central nervous system: alert, conversant.  Extremities: no edema    Data Reviewed: I have personally reviewed following labs and imaging studies  CBC: Recent Labs  Lab 02/10/22 0746 02/11/22 0341 02/12/22 0449 02/14/22 0459 02/15/22 0623 02/16/22 0600  WBC 10.6* 12.1* 9.6 10.6* 10.7* 11.4*  NEUTROABS 8.4* 8.7*  --   --   --   --   HGB 10.8* 11.5* 10.2* 10.4* 10.5* 10.5*  HCT 32.8* 34.3* 29.8* 31.2* 31.5* 32.3*  MCV 83.0 81.9 80.3 80.8 82.9 83.7  PLT 148* 185 238 391 434* 440*    Basic Metabolic Panel: Recent Labs  Lab 02/11/22 0341 02/12/22 0449 02/14/22 0459 02/15/22 0623 02/16/22 0600  NA 131* 131* 132* 131* 131*  K 3.6 3.5 4.3 4.3 4.4  CL 92* 94* 99 98 97*  CO2 '28 28 24 25 25  '$ GLUCOSE 108* 107* 103* 109* 132*  BUN 5* '6 10 17 15  '$ CREATININE 0.57* 0.65 0.78 0.73 0.69  CALCIUM 7.8* 7.4* 7.8* 7.8* 7.5*  MG 1.9  --  1.8  --   --     GFR: Estimated Creatinine Clearance: 139.7 mL/min (by C-G formula based on SCr of 0.69 mg/dL). Liver Function Tests: Recent Labs  Lab 02/10/22 0746  AST 18  ALT 20  ALKPHOS 61  BILITOT 0.6  PROT 5.4*  ALBUMIN 2.0*    No results for input(s): "LIPASE", "AMYLASE" in the last 168 hours. No results for input(s): "AMMONIA" in the last 168 hours. Coagulation Profile: No results for input(s): "INR", "PROTIME" in the last 168 hours.  Cardiac Enzymes: No results for input(s): "CKTOTAL", "CKMB", "CKMBINDEX", "TROPONINI" in the last 168 hours. BNP (last 3 results) No results for input(s): "PROBNP" in the last 8760 hours. HbA1C: No results for input(s): "HGBA1C" in the last 72 hours. CBG: Recent Labs  Lab 02/11/22 1756  GLUCAP 158*    Lipid Profile: No results for input(s): "CHOL", "HDL", "LDLCALC",  "TRIG", "CHOLHDL", "LDLDIRECT" in the last 72 hours. Thyroid Function Tests: No results for input(s): "TSH", "T4TOTAL", "FREET4", "T3FREE", "THYROIDAB" in the last 72 hours.  Anemia Panel: No results for input(s): "VITAMINB12", "FOLATE", "FERRITIN", "TIBC", "IRON", "RETICCTPCT" in the last 72 hours. Sepsis Labs: No results for input(s): "PROCALCITON", "LATICACIDVEN" in the last 168 hours.   Recent Results (from the past 240 hour(s))  Blood Culture (routine x 2)     Status: Abnormal   Collection Time: 02/07/22  5:01 PM   Specimen: BLOOD RIGHT HAND  Result Value Ref Range Status   Specimen Description   Final    BLOOD RIGHT HAND Performed at [facility], [address] [place], [place] [address]    Special Requests   Final    BOTTLES DRAWN AEROBIC AND ANAEROBIC Blood Culture results may not be optimal due to an inadequate volume of blood received in culture bottles Performed at [facility], [address] [place], [place] [address]    Culture  Setup Time   Final    Lonell Grandchild  POSITIVE COCCI BOTTLES DRAWN AEROBIC AND ANAEROBIC Gram Stain Report Called to,Read Back By and Verified With: GUILBEAULT,D '@0745'$  BY MATTHEWS, B 8.16.2023 IN BOTH AEROBIC AND ANAEROBIC BOTTLES    Culture (A)  Final    STAPHYLOCOCCUS AUREUS SUSCEPTIBILITIES PERFORMED ON PREVIOUS CULTURE WITHIN THE LAST 5 DAYS. Performed at [facility], [place] [address] [place], [place] [address]    Report Status 02/10/2022 FINAL  Final  Blood Culture (routine x 2)     Status: Abnormal   Collection Time: 02/07/22  5:01 PM   Specimen: BLOOD RIGHT ARM  Result Value Ref Range Status   Specimen Description   Final    BLOOD RIGHT ARM Performed at [facility], [address], [place], [place] [address]    Special Requests   Final    BOTTLES DRAWN AEROBIC AND ANAEROBIC Blood Culture results may not be optimal due to an inadequate volume of blood received in culture bottles Performed at [place]  [place], [place] [address]    Culture  Setup Time   Final    GRAM POSITIVE COCCI BOTTLES DRAWN AEROBIC AND ANAEROBIC Gram Stain Report Called to,Read Back By and Verified With: GUILBEAULT,D'@0745'$  BY MATTHEWS, B 8.16.2023 IN BOTH AEROBIC AND ANAEROBIC BOTTLES GRAM STAIN REVIEWED-AGREE WITH RESULT CRITICAL RESULT CALLED TO, READ BACK BY AND VERIFIED WITH: Noemi Chapel [id number] AT 1411 BY EC Performed at [facility], [place] [address] [place], [place] [address]    Culture METHICILLIN RESISTANT STAPHYLOCOCCUS AUREUS (A)  Final   Report Status 02/10/2022 FINAL  Final   Organism ID, Bacteria METHICILLIN RESISTANT STAPHYLOCOCCUS AUREUS  Final      Susceptibility   Methicillin resistant staphylococcus aureus - MIC*    CIPROFLOXACIN >=8 RESISTANT Resistant     ERYTHROMYCIN >=8 RESISTANT Resistant     GENTAMICIN <=0.5 SENSITIVE Sensitive     OXACILLIN >=4 RESISTANT Resistant     TETRACYCLINE >=16 RESISTANT Resistant     VANCOMYCIN <=0.5 SENSITIVE Sensitive     TRIMETH/SULFA >=320 RESISTANT Resistant     CLINDAMYCIN <=0.25 SENSITIVE Sensitive     RIFAMPIN <=0.5 SENSITIVE Sensitive     Inducible Clindamycin NEGATIVE Sensitive     * METHICILLIN RESISTANT STAPHYLOCOCCUS AUREUS  Blood Culture ID Panel (Reflexed)     Status: Abnormal   Collection Time: 02/07/22  5:01 PM  Result Value Ref Range Status   Enterococcus faecalis NOT DETECTED NOT DETECTED Final   Enterococcus Faecium NOT DETECTED NOT DETECTED Final   Listeria monocytogenes NOT DETECTED NOT DETECTED Final   Staphylococcus species DETECTED (A) NOT DETECTED Final    Comment: CRITICAL RESULT CALLED TO, READ BACK BY AND VERIFIED WITH: PHARMD Alycia Rossetti [id number] AT 1411 BY EC    Staphylococcus aureus (BCID) DETECTED (A) NOT DETECTED Final    Comment: Methicillin (oxacillin)-resistant Staphylococcus aureus (MRSA). MRSA is predictably resistant to beta-lactam antibiotics (except ceftaroline). Preferred therapy is vancomycin  unless clinically contraindicated. Patient requires contact precautions if  hospitalized. CRITICAL RESULT CALLED TO, READ BACK BY AND VERIFIED WITH: [place] [id number] AT 3716 BY EC    Staphylococcus epidermidis NOT DETECTED NOT DETECTED Final   Staphylococcus lugdunensis NOT DETECTED NOT DETECTED Final   Streptococcus species NOT DETECTED NOT DETECTED Final   Streptococcus agalactiae NOT DETECTED NOT DETECTED Final   Streptococcus pneumoniae NOT DETECTED NOT DETECTED Final   Streptococcus pyogenes NOT DETECTED NOT DETECTED Final   A.calcoaceticus-baumannii NOT DETECTED NOT DETECTED Final   Bacteroides fragilis NOT DETECTED  NOT DETECTED Final   Enterobacterales NOT DETECTED NOT DETECTED Final   Enterobacter cloacae complex NOT DETECTED NOT DETECTED Final   Escherichia coli NOT DETECTED NOT DETECTED Final   Klebsiella aerogenes NOT DETECTED NOT DETECTED Final   Klebsiella oxytoca NOT DETECTED NOT DETECTED Final   Klebsiella pneumoniae NOT DETECTED NOT DETECTED Final   Proteus species NOT DETECTED NOT DETECTED Final   Salmonella species NOT DETECTED NOT DETECTED Final   Serratia marcescens NOT DETECTED NOT DETECTED Final   Haemophilus influenzae NOT DETECTED NOT DETECTED Final   Neisseria meningitidis NOT DETECTED NOT DETECTED Final   Pseudomonas aeruginosa NOT DETECTED NOT DETECTED Final   Stenotrophomonas maltophilia NOT DETECTED NOT DETECTED Final   Candida albicans NOT DETECTED NOT DETECTED Final   Candida auris NOT DETECTED NOT DETECTED Final   Candida glabrata NOT DETECTED NOT DETECTED Final   Candida krusei NOT DETECTED NOT DETECTED Final   Candida parapsilosis NOT DETECTED NOT DETECTED Final   Candida tropicalis NOT DETECTED NOT DETECTED Final   Cryptococcus neoformans/gattii NOT DETECTED NOT DETECTED Final   Meth resistant mecA/C and MREJ DETECTED (A) NOT DETECTED Final    Comment: CRITICAL RESULT CALLED TO, READ BACK BY AND VERIFIED WITH: Noemi Chapel [id number] AT 1411 BY EC Performed at [facility], [address] [place], [place] [address]   Resp Panel by RT-PCR (Flu A&B, Covid) Anterior Nasal Swab     Status: None   Collection Time: 02/07/22  5:20 PM   Specimen: Anterior Nasal Swab  Result Value Ref Range Status   SARS Coronavirus 2 by RT PCR NEGATIVE NEGATIVE Final    Comment: (NOTE) SARS-CoV-2 target nucleic acids are NOT DETECTED.  The SARS-CoV-2 RNA is generally detectable in upper respiratory specimens during the acute phase of infection. The lowest concentration of SARS-CoV-2 viral copies this assay can detect is 138 copies/mL. A negative result does not preclude SARS-Cov-2 infection and should not be used as the sole basis for treatment or other patient management decisions. A negative result may occur with  improper specimen collection/handling, submission of specimen other than nasopharyngeal swab, presence of viral mutation(s) within the areas targeted by this assay, and inadequate number of viral copies(<138 copies/mL). A negative result must be combined with clinical observations, patient history, and epidemiological information. The expected result is Negative.  Fact Sheet for Patients:  EntrepreneurPulse.com.au  Fact Sheet for Healthcare Providers:  IncredibleEmployment.be  This test is no t yet approved or cleared by the [place] FDA and  has been authorized for detection and/or diagnosis of SARS-CoV-2 by FDA under an Emergency Use Authorization (EUA). This EUA will remain  in effect (meaning this test can be used) for the duration of the COVID-19 declaration under Section 564(b)(1) of the Act, 21 U.S.C.section 360bbb-3(b)(1), unless the authorization is terminated  or revoked sooner.       Influenza A by PCR NEGATIVE NEGATIVE Final   Influenza B by PCR NEGATIVE NEGATIVE Final    Comment: (NOTE) The Xpert Xpress SARS-CoV-2/FLU/RSV plus assay is  intended as an aid in the diagnosis of influenza from Nasopharyngeal swab specimens and should not be used as a sole basis for treatment. Nasal washings and aspirates are unacceptable for Xpert Xpress SARS-CoV-2/FLU/RSV testing.  Fact Sheet for Patients: EntrepreneurPulse.com.au  Fact Sheet for Healthcare Providers: IncredibleEmployment.be  This test is not yet approved or cleared by the [place] FDA and has been authorized for detection and/or diagnosis of SARS-CoV-2 by FDA under an Emergency  Use Authorization (EUA). This EUA will remain in effect (meaning this test can be used) for the duration of the COVID-19 declaration under Section 564(b)(1) of the Act, 21 U.S.C. section 360bbb-3(b)(1), unless the authorization is terminated or revoked.  Performed at [facility], [address] [place], [place] [address]   Urine Culture     Status: None   Collection Time: 02/07/22 11:32 PM   Specimen: In/Out Cath Urine  Result Value Ref Range Status   Specimen Description IN/OUT CATH URINE  Final   Special Requests NONE  Final   Culture   Final    NO GROWTH Performed at [facility], [place] [address], [place], [place] [address]    Report Status 02/08/2022 FINAL  Final  Culture, blood (Routine X 2) w Reflex to ID Panel     Status: None   Collection Time: 02/09/22  4:32 AM   Specimen: BLOOD RIGHT HAND  Result Value Ref Range Status   Specimen Description BLOOD RIGHT HAND  Final   Special Requests   Final    AEROBIC BOTTLE ONLY Blood Culture results may not be optimal due to an excessive volume of blood received in culture bottles   Culture   Final    NO GROWTH 5 DAYS Performed at [facility], [place] [address], [place], [place] [address]    Report Status 02/14/2022 FINAL  Final  Culture, blood (Routine X 2) w Reflex to ID Panel     Status: None   Collection Time: 02/09/22  4:32 AM   Specimen: BLOOD LEFT HAND  Result Value Ref Range  Status   Specimen Description BLOOD LEFT HAND  Final   Special Requests   Final    AEROBIC BOTTLE ONLY Blood Culture results may not be optimal due to an excessive volume of blood received in culture bottles   Culture   Final    NO GROWTH 5 DAYS Performed at [facility], [place] [address], [place], [place] [address]    Report Status 02/14/2022 FINAL  Final  Culture, blood (Routine X 2) w Reflex to ID Panel     Status: None   Collection Time: 02/11/22  3:49 AM   Specimen: BLOOD LEFT HAND  Result Value Ref Range Status   Specimen Description BLOOD LEFT HAND  Final   Special Requests   Final    BOTTLES DRAWN AEROBIC ONLY Blood Culture results may not be optimal due to an inadequate volume of blood received in culture bottles   Culture   Final    NO GROWTH 5 DAYS Performed at [facility], [place] [address] [place], [place] [address]    Report Status 02/16/2022 FINAL  Final  Culture, blood (Routine X 2) w Reflex to ID Panel     Status: None   Collection Time: 02/11/22  3:57 AM   Specimen: BLOOD RIGHT HAND  Result Value Ref Range Status   Specimen Description BLOOD RIGHT HAND  Final   Special Requests   Final    BOTTLES DRAWN AEROBIC ONLY Blood Culture results may not be optimal due to an inadequate volume of blood received in culture bottles   Culture   Final    NO GROWTH 5 DAYS Performed at [facility], [place] [address] [place], [place] [address]    Report Status 02/16/2022 FINAL  Final  Body fluid culture w Gram Stain     Status: None (Preliminary result)   Collection Time: 02/12/22  4:46 PM   Specimen: Pleural Fluid  Result Value Ref Range Status   Specimen Description FLUID  Final   Special Requests Immunocompromised  Final   Gram Stain NO WBC SEEN NO ORGANISMS SEEN   Final   Culture   Final    NO GROWTH 3 DAYS Performed at [facility], [address], [place], [place] [address]    Report Status PENDING  Incomplete  Fungus Culture With Stain      Status: None (Preliminary result)   Collection Time: 02/12/22  4:46 PM   Specimen: Pleural Fluid  Result Value Ref Range Status   Fungus Stain Final report  Final    Comment: (NOTE) Performed At: [facility] [place], [place] [id number] Rush Farmer MD [id number]    Fungus (Mycology) Culture PENDING  Incomplete   Fungal Source FLUID  Final    Comment: Performed at [facility], [place] [address] [place], [place] [address]  Fungus Culture Result     Status: None   Collection Time: 02/12/22  4:46 PM  Result Value Ref Range Status   Result 1 Comment  Final    Comment: (NOTE) KOH/Calcofluor preparation:  no fungus observed. Performed At: [facility] [place], [place] [id number] Rush Farmer MD [id number]   MRSA Next Gen by PCR, Nasal     Status: Abnormal   Collection Time: 02/14/22  7:22 PM   Specimen: Nasal Mucosa; Nasal Swab  Result Value Ref Range Status   MRSA by PCR Next Gen DETECTED (A) NOT DETECTED Final    Comment: RESULT CALLED TO, READ BACK BY AND VERIFIED WITH: RN Eulogio Ditch [phone] '@2311'$  FH (NOTE) The GeneXpert MRSA Assay (FDA approved for NASAL specimens only), is one component of a comprehensive MRSA colonization surveillance program. It is not intended to diagnose MRSA infection nor to guide or monitor treatment for MRSA infections. Test performance is not FDA approved in patients less than 49 years old. Performed at [facility], [place] [address] [place], [place] [address]          Radiology Studies: DG CHEST PORT 1 VIEW  Result Date: 02/16/2022 CLINICAL DATA:  [id number]; right chest tube, pleural effusion, follow-up study EXAM: PORTABLE CHEST 1 VIEW COMPARISON:  Chest x-ray from yesterday FINDINGS: Again seen is the pigtail right chest tube at the right lower lung. The chest tube is kinked at the insertion site. Cardiomediastinal silhouette is enlarged and stable. There has been interval decrease  in the right pleural effusion. There has been interval improvement of the interstitial edema. There are patchy opacity seen in the bilateral mid-lower lungs greater at the retrocardiac region and have mildly improved in the interim as well. The visualized skeletal structures are unremarkable. IMPRESSION: 1. There is a pigtail right chest tube at lower lung. There is a kink at the catheter insertion site. There has been interval decrease in the right pleural effusion. 2. There has been interval improvement of the interstitial edema as well as the patchy bilateral opacities likely on the basis of atelectasis-infiltrations. Electronically Signed   By: Frazier Richards M.D.   On: 02/16/2022 07:59   DG CHEST PORT 1 VIEW  Result Date: 02/15/2022 CLINICAL DATA:  Sepsis.  Chest tube present. EXAM: PORTABLE CHEST 1 VIEW COMPARISON:  One-view chest x-ray 02/14/2022 FINDINGS: The heart is enlarged. Diffuse interstitial edema has increased. Right pleural effusion is increased slightly. No pneumothorax is present. A small left pleural effusion is  stable. Lung volumes have decreased. IMPRESSION: 1. Cardiomegaly with increasing interstitial edema and right pleural effusion. 2. Stable small left pleural effusion. Electronically Signed   By: [place] More[place] M.D.   On: 02/15/2022 08:11   DG CHEST PORT 1 VIEW  Result Date: 02/14/2022 CLINICAL DATA:  Respiratory distress EXAM: PORTABLE CHEST 1 VIEW COMPARISON:  Chest x-ray dated February 14, 2022 FINDINGS: Cardiac and mediastinal contours are unchanged. Small right pleural effusion with chest tube in place. Stable trace left pleural effusion. Unchanged bilateral heterogeneous opacities. No evidence of pneumothorax. IMPRESSION: 1. Stable small right pleural effusion with chest tube in place. 2. Unchanged bilateral heterogeneous pulmonary opacities. Electronically Signed   By: Yetta Glassman M.D.   On: 02/14/2022 18:14   DG CHEST PORT 1 VIEW  Result Date:  02/14/2022 CLINICAL DATA:  Shortness of breath, cough EXAM: PORTABLE CHEST 1 VIEW COMPARISON:  Previous studies including the examination of 02/12/2022 FINDINGS: Cardiac size is within normal limits. Central pulmonary vessels are less prominent. There is improvement in the aeration in both lungs. Residual increased markings are seen in right parahilar region and both lower lung fields. Small to moderate bilateral pleural effusions are seen, more so on the right side. Right chest tube is noted. There is no pneumothorax. IMPRESSION: There is slight improvement in the aeration of both lungs suggesting resolving pneumonia or decrease in pulmonary edema. No new focal infiltrates are seen. Small to moderate bilateral pleural effusions, more so on the right side with no significant change. Electronically Signed   By: Elmer Picker M.D.   On: 02/14/2022 13:22        Scheduled Meds:  bictegravir-emtricitabine-tenofovir AF  1 tablet Oral Daily   Chlorhexidine Gluconate Cloth  6 each Topical Daily   enoxaparin (LOVENOX) injection  40 mg Subcutaneous Q24H   fluconazole  200 mg Oral Daily   guaiFENesin  600 mg Oral BID   ketorolac  15 mg Intravenous Q6H   lidocaine  2 patch Transdermal Q24H   polyethylene glycol  17 g Oral BID   senna-docusate  1 tablet Oral BID   sodium chloride flush  10 mL Intrapleural Q8H   sodium chloride flush  10 mL Intrapleural Q8H   sodium chloride flush  3 mL Intravenous Q12H   sulfamethoxazole-trimethoprim  1 tablet Oral Once per day on Mon Wed Fri   Continuous Infusions:  vancomycin Stopped (02/16/22 0229)     LOS: 9 days    Time spent: 35 minutes    Vontae Court A Smita Lesh, MD [facility]   If 7PM-7AM, please contact night-coverage [email or web address]  02/16/2022, 8:28 AM

## 2022-02-16 NOTE — Procedures (Signed)
Pleural Fibrinolytic Administration Procedure Note  KOTY ANCTIL  [id number]  02/10/1991  Date:02/16/22  Time:10:15 AM   Provider Performing:Juanda Luba B Brock Mokry   Procedure: Pleural Fibrinolysis Subsequent day [phone])  Indication(s) Fibrinolysis of complicated pleural effusion  Consent Risks of the procedure as well as the alternatives and risks of each were explained to the patient and/or caregiver.  Consent for the procedure was obtained.   Anesthesia None   Time Out Verified patient identification, verified procedure, site/side was marked, verified correct patient position, special equipment/implants available, medications/allergies/relevant history reviewed, required imaging and test results available.   Sterile Technique Hand hygiene, gloves   Procedure Description Existing pleural catheter was cleaned and accessed in sterile manner.  '10mg'$  of tPA in 30cc of saline and '5mg'$  of dornase in 30cc of sterile water were injected into pleural space using existing pleural catheter.  Catheter will be clamped for 1 hour and then placed back to suction.   Complications/Tolerance None; patient tolerated the procedure well.  EBL None   Specimen(s) None

## 2022-02-17 ENCOUNTER — Inpatient Hospital Stay (HOSPITAL_COMMUNITY): Payer: Self-pay

## 2022-02-17 DIAGNOSIS — J869 Pyothorax without fistula: Secondary | ICD-10-CM

## 2022-02-17 LAB — BASIC METABOLIC PANEL
Anion gap: 2 — ABNORMAL LOW (ref 5–15)
BUN: 14 mg/dL (ref 6–20)
CO2: 27 mmol/L (ref 22–32)
Calcium: 7.2 mg/dL — ABNORMAL LOW (ref 8.9–10.3)
Chloride: 102 mmol/L (ref 98–111)
Creatinine, Ser: 0.64 mg/dL (ref 0.61–1.24)
GFR, Estimated: >60 mL/min (ref >60)
Glucose, Bld: 94 mg/dL (ref 70–99)
Potassium: 4.1 mmol/L (ref 3.5–5.1)
Sodium: 131 mmol/L — ABNORMAL LOW (ref 135–145)

## 2022-02-17 LAB — VANCOMYCIN, PEAK: Vancomycin Pk: 22 ug/mL — ABNORMAL LOW (ref 30–40)

## 2022-02-17 LAB — CBC
HCT: 28.3 % — ABNORMAL LOW (ref 39.0–52.0)
Hemoglobin: 9.2 g/dL — ABNORMAL LOW (ref 13.0–17.0)
MCH: 27 pg (ref 26.0–34.0)
MCHC: 32.5 g/dL (ref 30.0–36.0)
MCV: 83 fL (ref 80.0–100.0)
Platelets: 547 10*3/uL — ABNORMAL HIGH (ref 150–400)
RBC: 3.41 MIL/uL — ABNORMAL LOW (ref 4.22–5.81)
RDW: 16.4 % — ABNORMAL HIGH (ref 11.5–15.5)
WBC: 8.9 10*3/uL (ref 4.0–10.5)
nRBC: 0 % (ref 0.0–0.2)

## 2022-02-17 LAB — VANCOMYCIN, TROUGH: Vancomycin Tr: 34 ug/mL (ref 15–20)

## 2022-02-17 NOTE — Progress Notes (Signed)
   NAME:  Rick Lam, MRN:  [id number], DOB:  06-01-1991, LOS: 33 ADMISSION DATE:  02/07/2022, CONSULTATION DATE:  02/07/2022 REFERRING MD:  Roderic Palau, CHIEF COMPLAINT:  fever, chest discomfort, weakness   History of Present Illness:  Rick Lam is a 31 year old male with HIV/AIDS on biktarvy, chronic hepatitis C, latent syphillis s/p treatment who presented with painful left axillary nodules with erythema, found to have sepsis with MRSA bacteremia along with axillary abscess, cellulitis and septic pulmonary emboli.   TEE 8/18 negative for vegetations. ID is following and patient has been on vancomycin. PCCM consulted due to increasing respiratory distress and new right pleural effusion.  Pertinent Medical History:  HIV/AIDS [place] Late latent syphilis, treated Chronic HCV  Significant Hospital Events: Including procedures, antibiotic start and stop dates in addition to other pertinent events   02/06/22 transfer from [facility] ED to ED, started on vanc/cefepime 8/15 - CT Chest with innumerable pulmonary nodules bilaterally, several cavitated; asymmetrical skin thickening/SQ edema in L axilla c/f cellulitis. PCCM consulted. 8/16 - TTE with EF 65-70%, LE dopplers negative for DVT. MRI L humerus with severe upper arm cellulitis, two small 1.5cm abscesses. ID consulted 8/17 - Ortho consult for cellulitis, conservative management with IV abx. BCx positive for MRSA, vanc-sensitive. 8/18 - TEE with Cards, r/o vegetation. Persistent pain. Cellulitis/erythema improving. 8/20 - chest tube placed  8/21 CT of the chest 8/22 First dose of TPA/DNAse with 822m put of of chest tube 8/23 Second dose of TPA/DNAse with 1.8 L out  8/24 Third dose of TPA/DNAse   Interim History / Subjective:   352 cc out of the chest tube.  Somnolent today after receiving Dilaudid.  No complaints  Objective:  Blood pressure (!) 100/50, pulse 89, temperature 97.8 F (36.6 C), temperature source Axillary, resp. rate 12, height  6' (1.829 m), weight 78 kg, SpO2 97 %.        Intake/Output Summary (Last 24 hours) at 02/17/2022 0839 Last data filed at 02/17/2022 [id number] per 24 hour  Intake 2182.77 ml  Output 1302 ml  Net 880.77 ml   Filed Weights   02/15/22 0500 02/16/22 0500 02/17/22 0500  Weight: 79.5 kg 75.2 kg 78 kg   Physical Examination: Gen:      No acute distress, ill-appearing HEENT:  EOMI, sclera anicteric Neck:     No masses; no thyromegaly Lungs:    Clear to auscultation bilaterally; normal respiratory effort CV:         Regular rate and rhythm; no murmurs Abd:      + bowel sounds; soft, non-tender; no palpable masses, no distension Ext:    No edema; adequate peripheral perfusion Skin:      Warm and dry; no rash Neuro: alert and oriented x 3 Psych: normal mood and affect   Assessment & Plan:   Sepsis due to MRSA Bacteremia, septic pulmonary emboli and left arm abscess/cellulitis Acute Hypoxemic Respiratory Failure Right Pleural Effusion HIV/AIDS  Plan: S/p 3 doses of pleural lytic therapy His output has diminished from the chest tube We will get chest x-ray today to reevaluate pleural effusion  Signature:   PMarshell GarfinkelMD [facility] See Amion for pager  If no response to pager , please call [phone] until 7pm After 7:00 pm call Elink  [phone]/25/2023, 10:19 AM

## 2022-02-17 NOTE — Progress Notes (Signed)
[facility] for Infectious Disease  Date of Admission:  02/07/2022     Abx: 8/15-c vanc   8/15 cefepime                                                      Assessment 31 yo male with aids on biktarvy improving cd4, mediastinal/axillary lymphadenopathy, chronic hep c, hx syphilis late latent s/p tx admitted for sepsis found to have LUE cellulitis/abscess, pulm nodules, and mrsa bacteremia   #mrsa septicemia #pulm septic nodules #pleural effusion -- empyema #left arm cellulitis/abscess tee 8/18 no vegetation serial xray chest showed evolving new pleural effusion right side s/p chest tube placement 8/20; cx ngtd ortho/general surgery evluated left arm and no indication for surgical intervention postive admission bcx 8/15; repeat bcx 8/17 and 8/19 negative  ----- 8/25 assessment 8/21 chest ct post chest tube with bilateral R>L effusion. Improving chest pain now with tpa. Good chest tube output although has diminished. Repeat xray chest today 8/25 showed much improved right pleural effusion and stable left pleural effusion  Fever had resolved since initiating tpa instillation  Leukocytosis also resolved  -continue vanc -appreciate pulm help with patient -- chest tube management per pulm     #hiv #aids #axillary/mediastinal LAD He has one value of hiv viral load in the 300k after being virologically controlled. Report compliance and I do believe him. He is due for a repeat viral load. He mentioned he missed 1 week as he grabbed a wrong bottle coming to his aunt's house a week ago to help her with chores. His cd4% has been increasing since being on biktarvy LAD likely hiv related -- improved axillary adenopathy on repeat chest ct this admission Repeat viral load this admission 16k in setting missing 1 week art  -will continue bactrim prophy and biktarvy -repeat viral load planned 3-4 weeks from 8/20; if continue to be high will need to send repeat  genotype   #hx hep c infection He has positive hep c lab 05/2021 but repeat viral load (hcv genotype testing) 08/2021 was negative. Repeat hep c viral load this admission negative. Likely cleared it spontaneously    #s/p late latent syphilis tx and rpr improving appropriately   #oral thrush Primary team conconcerned about oral thrush. Ok with 14 days oral fluconazole 200 mg daily  -agree with 2 weeks oral fluconazole 200 mg daily until 9/4   I spent more than 35 minute reviewing data/chart, and coordinating care and >50% direct face to face time providing counseling/discussing diagnostics/treatment plan with patient     Principal Problem:   Severe sepsis ([place]) Active Problems:   AIDS (acquired immune deficiency syndrome) ([place])   Oral thrush   Hyponatremia   Acute respiratory failure with hypoxia ([place])   Acute septic pulmonary embolism without acute cor pulmonale (H[facility])   Cutaneous abscess of left upper extremity   Pleural effusion   Allergies  Allergen Reactions   Banana     Other reaction(s): hives    Scheduled Meds:  bictegravir-emtricitabine-tenofovir AF  1 tablet Oral Daily   Chlorhexidine Gluconate Cloth  6 each Topical Daily   enoxaparin (LOVENOX) injection  40 mg Subcutaneous Q24H   fluconazole  200 mg Oral Daily   guaiFENesin  600 mg Oral BID   ketorolac  15 mg  Intravenous Q6H   lidocaine  2 patch Transdermal Q24H   polyethylene glycol  17 g Oral BID   senna-docusate  1 tablet Oral BID   sodium chloride flush  10 mL Intrapleural Q8H   sodium chloride flush  10 mL Intrapleural Q8H   sodium chloride flush  10 mL Intrapleural Q8H   sodium chloride flush  3 mL Intravenous Q12H   sulfamethoxazole-trimethoprim  1 tablet Oral Once per day on Mon Wed Fri   Continuous Infusions:  vancomycin Stopped (02/17/22 1041)   PRN Meds:.acetaminophen **OR** acetaminophen, bisacodyl, hydrALAZINE, hydrOXYzine, ipratropium-albuterol, metoprolol tartrate, naLOXone (NARCAN)   injection, ondansetron **OR** ondansetron (ZOFRAN) IV, mouth rinse, oxyCODONE, traZODone   SUBJECTIVE: Afebrile Waiting for floor bed Chest xray improved right pleural effusion Chest pain controlled No n/v/d    Review of Systems: ROS All other ROS was negative, except mentioned above     OBJECTIVE: Vitals:   02/17/22 0743 02/17/22 1000 02/17/22 1200 02/17/22 1500  BP: (!) 100/50 109/62 (!) 104/54 122/69  Pulse: 89 (!) 102 100 (!) 105  Resp: '12 15 17   '$ Temp:      TempSrc:      SpO2: 97% 94% 95% 96%  Weight:      Height:       Body mass index is 23.32 kg/m.  Physical Exam  General/constitutional: no distress, pleasant HEENT: Normocephalic, PER, Conj Clear, EOMI, Oropharynx clear Neck supple CV: rrr no mrg Abd: Soft, Nontender Ext: no edema Neuro: nonfocal -- sleepy with pain meds Lungs: normal respiratory effort; chest tube functioning; no leak in ct Skin: no active cellulitis   Lab Results Lab Results  Component Value Date   WBC 8.9 02/17/2022   HGB 9.2 (L) 02/17/2022   HCT 28.3 (L) 02/17/2022   MCV 83.0 02/17/2022   PLT 547 (H) 02/17/2022    Lab Results  Component Value Date   CREATININE 0.64 02/17/2022   BUN 14 02/17/2022   NA 131 (L) 02/17/2022   K 4.1 02/17/2022   CL 102 02/17/2022   CO2 27 02/17/2022    Lab Results  Component Value Date   ALT 20 02/10/2022   AST 18 02/10/2022   ALKPHOS 61 02/10/2022   BILITOT 0.6 02/10/2022      Microbiology: Recent Results (from the past 240 hour(s))  Blood Culture (routine x 2)     Status: Abnormal   Collection Time: 02/07/22  5:01 PM   Specimen: BLOOD RIGHT HAND  Result Value Ref Range Status   Specimen Description   Final    BLOOD RIGHT HAND Performed at [facility], [address], [place], [place] [address]    Special Requests   Final    BOTTLES DRAWN AEROBIC AND ANAEROBIC Blood Culture results may not be optimal due to an inadequate volume of blood received in culture bottles Performed  at [facility], [address], [place], [place] [address]    Culture  Setup Time   Final    GRAM POSITIVE COCCI BOTTLES DRAWN AEROBIC AND ANAEROBIC Gram Stain Report Called to,Read Back By and Verified With: GUILBEAULT,D '@0745'$  BY MATTHEWS, B 8.16.2023 IN BOTH AEROBIC AND ANAEROBIC BOTTLES    Culture (A)  Final    STAPHYLOCOCCUS AUREUS SUSCEPTIBILITIES PERFORMED ON PREVIOUS CULTURE WITHIN THE LAST 5 DAYS. Performed at [facility], [place] [address] [place], [place] [address]    Report Status 02/10/2022 FINAL  Final  Blood Culture (routine x 2)     Status: Abnormal   Collection  Time: 02/07/22  5:01 PM   Specimen: BLOOD RIGHT ARM  Result Value Ref Range Status   Specimen Description   Final    BLOOD RIGHT ARM Performed at [facility], [address] [place], [place] [address]    Special Requests   Final    BOTTLES DRAWN AEROBIC AND ANAEROBIC Blood Culture results may not be optimal due to an inadequate volume of blood received in culture bottles Performed at [place] [place], [place] [address]    Culture  Setup Time   Final    GRAM POSITIVE COCCI BOTTLES DRAWN AEROBIC AND ANAEROBIC Gram Stain Report Called to,Read Back By and Verified With: GUILBEAULT,D'@0745'$  BY MATTHEWS, B 8.16.2023 IN BOTH AEROBIC AND ANAEROBIC BOTTLES GRAM STAIN REVIEWED-AGREE WITH RESULT CRITICAL RESULT CALLED TO, READ BACK BY AND VERIFIED WITH: Noemi Chapel [phone] AT 1411 BY EC Performed at [facility], [place] [address] [place], [place] [address]    Culture METHICILLIN RESISTANT STAPHYLOCOCCUS AUREUS (A)  Final   Report Status 02/10/2022 FINAL  Final   Organism ID, Bacteria METHICILLIN RESISTANT STAPHYLOCOCCUS AUREUS  Final      Susceptibility   Methicillin resistant staphylococcus aureus - MIC*    CIPROFLOXACIN >=8 RESISTANT Resistant     ERYTHROMYCIN >=8 RESISTANT Resistant     GENTAMICIN <=0.5 SENSITIVE Sensitive     OXACILLIN >=4 RESISTANT Resistant      TETRACYCLINE >=16 RESISTANT Resistant     VANCOMYCIN <=0.5 SENSITIVE Sensitive     TRIMETH/SULFA >=320 RESISTANT Resistant     CLINDAMYCIN <=0.25 SENSITIVE Sensitive     RIFAMPIN <=0.5 SENSITIVE Sensitive     Inducible Clindamycin NEGATIVE Sensitive     * METHICILLIN RESISTANT STAPHYLOCOCCUS AUREUS  Blood Culture ID Panel (Reflexed)     Status: Abnormal   Collection Time: 02/07/22  5:01 PM  Result Value Ref Range Status   Enterococcus faecalis NOT DETECTED NOT DETECTED Final   Enterococcus Faecium NOT DETECTED NOT DETECTED Final   Listeria monocytogenes NOT DETECTED NOT DETECTED Final   Staphylococcus species DETECTED (A) NOT DETECTED Final    Comment: CRITICAL RESULT CALLED TO, READ BACK BY AND VERIFIED WITH: PHARMD Alycia Rossetti [id number] AT 1411 BY EC    Staphylococcus aureus (BCID) DETECTED (A) NOT DETECTED Final    Comment: Methicillin (oxacillin)-resistant Staphylococcus aureus (MRSA). MRSA is predictably resistant to beta-lactam antibiotics (except ceftaroline). Preferred therapy is vancomycin unless clinically contraindicated. Patient requires contact precautions if  hospitalized. CRITICAL RESULT CALLED TO, READ BACK BY AND VERIFIED WITH: [place] [phone] AT 6834 BY EC    Staphylococcus epidermidis NOT DETECTED NOT DETECTED Final   Staphylococcus lugdunensis NOT DETECTED NOT DETECTED Final   Streptococcus species NOT DETECTED NOT DETECTED Final   Streptococcus agalactiae NOT DETECTED NOT DETECTED Final   Streptococcus pneumoniae NOT DETECTED NOT DETECTED Final   Streptococcus pyogenes NOT DETECTED NOT DETECTED Final   A.calcoaceticus-baumannii NOT DETECTED NOT DETECTED Final   Bacteroides fragilis NOT DETECTED NOT DETECTED Final   Enterobacterales NOT DETECTED NOT DETECTED Final   Enterobacter cloacae complex NOT DETECTED NOT DETECTED Final   Escherichia coli NOT DETECTED NOT DETECTED Final   Klebsiella aerogenes NOT DETECTED NOT DETECTED Final    Klebsiella oxytoca NOT DETECTED NOT DETECTED Final   Klebsiella pneumoniae NOT DETECTED NOT DETECTED Final   Proteus species NOT DETECTED NOT DETECTED Final   Salmonella species NOT DETECTED NOT DETECTED Final   Serratia marcescens NOT DETECTED NOT DETECTED Final   Haemophilus influenzae  NOT DETECTED NOT DETECTED Final   Neisseria meningitidis NOT DETECTED NOT DETECTED Final   Pseudomonas aeruginosa NOT DETECTED NOT DETECTED Final   Stenotrophomonas maltophilia NOT DETECTED NOT DETECTED Final   Candida albicans NOT DETECTED NOT DETECTED Final   Candida auris NOT DETECTED NOT DETECTED Final   Candida glabrata NOT DETECTED NOT DETECTED Final   Candida krusei NOT DETECTED NOT DETECTED Final   Candida parapsilosis NOT DETECTED NOT DETECTED Final   Candida tropicalis NOT DETECTED NOT DETECTED Final   Cryptococcus neoformans/gattii NOT DETECTED NOT DETECTED Final   Meth resistant mecA/C and MREJ DETECTED (A) NOT DETECTED Final    Comment: CRITICAL RESULT CALLED TO, READ BACK BY AND VERIFIED WITH: Noemi Chapel [phone] AT 1411 BY EC Performed at [facility], [place] [address] [place], [place] [address]   Resp Panel by RT-PCR (Flu A&B, Covid) Anterior Nasal Swab     Status: None   Collection Time: 02/07/22  5:20 PM   Specimen: Anterior Nasal Swab  Result Value Ref Range Status   SARS Coronavirus 2 by RT PCR NEGATIVE NEGATIVE Final    Comment: (NOTE) SARS-CoV-2 target nucleic acids are NOT DETECTED.  The SARS-CoV-2 RNA is generally detectable in upper respiratory specimens during the acute phase of infection. The lowest concentration of SARS-CoV-2 viral copies this assay can detect is 138 copies/mL. A negative result does not preclude SARS-Cov-2 infection and should not be used as the sole basis for treatment or other patient management decisions. A negative result may occur with  improper specimen collection/handling, submission of specimen other than nasopharyngeal swab,  presence of viral mutation(s) within the areas targeted by this assay, and inadequate number of viral copies(<138 copies/mL). A negative result must be combined with clinical observations, patient history, and epidemiological information. The expected result is Negative.  Fact Sheet for Patients:  EntrepreneurPulse.com.au  Fact Sheet for Healthcare Providers:  IncredibleEmployment.be  This test is no t yet approved or cleared by the [place] FDA and  has been authorized for detection and/or diagnosis of SARS-CoV-2 by FDA under an Emergency Use Authorization (EUA). This EUA will remain  in effect (meaning this test can be used) for the duration of the COVID-19 declaration under Section 564(b)(1) of the Act, 21 U.S.C.section 360bbb-3(b)(1), unless the authorization is terminated  or revoked sooner.       Influenza A by PCR NEGATIVE NEGATIVE Final   Influenza B by PCR NEGATIVE NEGATIVE Final    Comment: (NOTE) The Xpert Xpress SARS-CoV-2/FLU/RSV plus assay is intended as an aid in the diagnosis of influenza from Nasopharyngeal swab specimens and should not be used as a sole basis for treatment. Nasal washings and aspirates are unacceptable for Xpert Xpress SARS-CoV-2/FLU/RSV testing.  Fact Sheet for Patients: EntrepreneurPulse.com.au  Fact Sheet for Healthcare Providers: IncredibleEmployment.be  This test is not yet approved or cleared by the [place] FDA and has been authorized for detection and/or diagnosis of SARS-CoV-2 by FDA under an Emergency Use Authorization (EUA). This EUA will remain in effect (meaning this test can be used) for the duration of the COVID-19 declaration under Section 564(b)(1) of the Act, 21 U.S.C. section 360bbb-3(b)(1), unless the authorization is terminated or revoked.  Performed at [facility], [address] [place], [place] [address]   Urine Culture     Status:  None   Collection Time: 02/07/22 11:32 PM   Specimen: In/Out Cath Urine  Result Value Ref Range Status   Specimen Description IN/OUT CATH URINE  Final   Special Requests NONE  Final   Culture   Final    NO GROWTH Performed at [facility], [place] [address] [place], [place] [address]    Report Status 02/08/2022 FINAL  Final  Culture, blood (Routine X 2) w Reflex to ID Panel     Status: None   Collection Time: 02/09/22  4:32 AM   Specimen: BLOOD RIGHT HAND  Result Value Ref Range Status   Specimen Description BLOOD RIGHT HAND  Final   Special Requests   Final    AEROBIC BOTTLE ONLY Blood Culture results may not be optimal due to an excessive volume of blood received in culture bottles   Culture   Final    NO GROWTH 5 DAYS Performed at [facility], [place] [address] [place], [place] [address]    Report Status 02/14/2022 FINAL  Final  Culture, blood (Routine X 2) w Reflex to ID Panel     Status: None   Collection Time: 02/09/22  4:32 AM   Specimen: BLOOD LEFT HAND  Result Value Ref Range Status   Specimen Description BLOOD LEFT HAND  Final   Special Requests   Final    AEROBIC BOTTLE ONLY Blood Culture results may not be optimal due to an excessive volume of blood received in culture bottles   Culture   Final    NO GROWTH 5 DAYS Performed at [facility], [place] [address], [place], [place] [address]    Report Status 02/14/2022 FINAL  Final  Culture, blood (Routine X 2) w Reflex to ID Panel     Status: None   Collection Time: 02/11/22  3:49 AM   Specimen: BLOOD LEFT HAND  Result Value Ref Range Status   Specimen Description BLOOD LEFT HAND  Final   Special Requests   Final    BOTTLES DRAWN AEROBIC ONLY Blood Culture results may not be optimal due to an inadequate volume of blood received in culture bottles   Culture   Final    NO GROWTH 5 DAYS Performed at [facility], [place] [address] [place], [place] [address]    Report Status 02/16/2022 FINAL  Final   Culture, blood (Routine X 2) w Reflex to ID Panel     Status: None   Collection Time: 02/11/22  3:57 AM   Specimen: BLOOD RIGHT HAND  Result Value Ref Range Status   Specimen Description BLOOD RIGHT HAND  Final   Special Requests   Final    BOTTLES DRAWN AEROBIC ONLY Blood Culture results may not be optimal due to an inadequate volume of blood received in culture bottles   Culture   Final    NO GROWTH 5 DAYS Performed at [facility], [place] [address] [place], [place] [address]    Report Status 02/16/2022 FINAL  Final  Body fluid culture w Gram Stain     Status: None   Collection Time: 02/12/22  4:46 PM   Specimen: Pleural Fluid  Result Value Ref Range Status   Specimen Description FLUID  Final   Special Requests Immunocompromised  Final   Gram Stain NO WBC SEEN NO ORGANISMS SEEN   Final   Culture   Final    NO GROWTH 3 DAYS Performed at [place] [address] [place], [place] [address]    Report Status 02/16/2022 FINAL  Final  Fungus Culture With Stain     Status: None (Preliminary result)  Collection Time: 02/12/22  4:46 PM   Specimen: Pleural Fluid  Result Value Ref Range Status   Fungus Stain Final report  Final    Comment: (NOTE) Performed At: [facility] [place], [place] [id number] Rush Farmer MD [id number]    Fungus (Mycology) Culture PENDING  Incomplete   Fungal Source FLUID  Final    Comment: Performed at [facility], [place] [address] [place], [place] [address]  Fungus Culture Result     Status: None   Collection Time: 02/12/22  4:46 PM  Result Value Ref Range Status   Result 1 Comment  Final    Comment: (NOTE) KOH/Calcofluor preparation:  no fungus observed. Performed At: [facility] [place], [place] [id number] Rush Farmer MD [id number]   MRSA Next Gen by PCR, Nasal     Status: Abnormal   Collection Time: 02/14/22  7:22 PM   Specimen: Nasal Mucosa; Nasal Swab  Result  Value Ref Range Status   MRSA by PCR Next Gen DETECTED (A) NOT DETECTED Final    Comment: RESULT CALLED TO, READ BACK BY AND VERIFIED WITH: RN Eulogio Ditch [phone] '@2311'$  FH (NOTE) The GeneXpert MRSA Assay (FDA approved for NASAL specimens only), is one component of a comprehensive MRSA colonization surveillance program. It is not intended to diagnose MRSA infection nor to guide or monitor treatment for MRSA infections. Test performance is not FDA approved in patients less than 59 years old. Performed at [facility], [place] [address], [place], [place] [address]      Serology:   Imaging: If present, new imagings (plain films, ct scans, and mri) have been personally visualized and interpreted; radiology reports have been reviewed. Decision making incorporated into the Impression / Recommendations.  8/25 cxr Stable position of right-sided chest tube is noted. No definite pneumothorax is noted. There is again noted a kink involving the chest tube. Stable probable bilateral pulmonary edema with left pleural effusion.   8/22 chest ct 1. Severe cellulitis of the upper arm, with two small 1.5 cm superficial abscesses in the medial proximal upper arm and lateral chest wall/axilla.   8/16 tte  1. Left ventricular ejection fraction, by estimation, is 65 to 70%. The  left ventricle has normal function. The left ventricle has no regional  wall motion abnormalities. Left ventricular diastolic parameters were  normal.   2. Right ventricular systolic function is normal. The right ventricular  size is normal.   3. The mitral valve is normal in structure. No evidence of mitral valve  regurgitation. No evidence of mitral stenosis.   4. The aortic valve has an indeterminant number of cusps. Aortic valve  regurgitation is not visualized. No aortic stenosis is present.   5. The inferior vena cava is normal in size with greater than 50%  respiratory variability, suggesting right atrial  pressure of 3 mmHg.    8/16 mri left humerus Soft tissue Severe circumferential soft tissue swelling and enhancement of the upper arm. There is a small 1.5 x 1.0 x 1.6 cm rim enhancing fluid collection in the medial proximal upper arm just deep to the skin surface (series 11, image 20). There is a second slightly smaller 1.5 x 0.6 x 1.2 cm rim enhancing fluid collection in the lateral chest wall/axilla (series 11, image 21).   Multifocal patchy airspace disease in the left lung.   IMPRESSION: 1. Severe cellulitis of the upper arm, with two small 1.5 cm superficial abscesses in the  medial proximal upper arm and lateral chest wall/axilla.   8/15 ct chest with contrast 1. Innumerable pulmonary nodules scattered throughout both lungs, several of which are cavitated. Differential considerations include septic pulmonary emboli as well as atypical bacterial or fungal pneumonia given history of HIV. 2. Asymmetric nodular skin thickening and subcutaneous edema in the left axilla, concerning for cellulitis. No discrete abscess. 3. Trace bilateral pleural effusions.    Jabier Mutton, [facility] [place] [phone] pager    02/17/2022, 3:49 PM

## 2022-02-17 NOTE — Progress Notes (Signed)
PROGRESS NOTE    Rick Lam  DPO:[id number] DOB: 08-02-1990 DOA: 02/07/2022 PCP: No primary care provider on file.   Brief Narrative: 31 year old with past medical history significant for HIV/AIDS, recent CD4 62, chronic hepatitis C, latent syphilis that has been treated previously.  Presented with painful left axillary nodules with surrounding erythema he was admitted with sepsis, found to have MRSA bacteremia secondary to axillary abscess and cellulitis.  He underwent TEE 8/18 which was negative for endocarditis.  ID is following.  Patient developed worsening respiratory distress 8/20.  He was found to have a large right-sided pleural effusion, empyema.  Chest tube was placed.  Transfer to ICU 8/22 secondary to worsening respiratory failure in the setting of pigtail kink.     Assessment & Plan:   Principal Problem:   Severe sepsis ([place]) Active Problems:   AIDS (acquired immune deficiency syndrome) (HCC)   Oral thrush   Hyponatremia   Acute respiratory failure with hypoxia (HCC)   Acute septic pulmonary embolism without acute cor pulmonale (HCC)   Cutaneous abscess of left upper extremity   Pleural effusion   1-Sepsis secondary to MRSA bacteremia,  bilateral likely pulmonary septic emboli, Left arm cellulitis and abscess, Empyema -Patient immunosuppressed, in the setting of HIV/AIDS. -Blood cultures from 8/15 positive for MRSA. -Repeated blood culture 8/17 no growth to date. Blood culture 8/19: No growth to date.  -MRI left upper extremity with a small abscess x2 evaluated by Ortho, did not recommend drainage at that time. -TEE 8/18 negative for endocarditis -Pulmonology recommended repeat CT chest at the end of treatment for MRSA if lesions persist consider outpatient bronchoscopy. -Continue with IV vancomycin. -Surgery consulted for further evaluation of axillary abscess on 8/19. -Surgery recommend warm compress, IV antibiotics, no need for drainage.   Acute hypoxic  respiratory failure: Bilateral pulmonary nodules, concern with septic emboli Right side Empyema:  Continue with oxygen supplementation.  Incentive spirometry/ Flutter Valve S/P pigtail catheter placement right  8/20. CCM following,  Third dose of lytics through catheter 8/24. Pleural fluid, no growth to date.  Chest x ray 8/25: Stable probable bilateral pulmonary edema with left pleural effusion Follow Pulmonary recommendations.   HIV/AIDS: Continue with Biktarvy and Bactrim ID following, appreciate assistance  Hyponatremia: Could be combination of dehydration and some component of SIADH Sodium improved with hydration.  AKI: Resolved Oral thrush; odynophagia; On diflucan, needs 2 weeks treatment.   Estimated body mass index is 23.32 kg/m as calculated from the following:   Height as of this encounter: 6' (1.829 m).   Weight as of this encounter: 78 kg.   DVT prophylaxis: Lovenox Code Status: Full code Family Communication: Care discussed with patient and mother at bedside. /8/23.  Disposition Plan:  Status is: Inpatient Remains inpatient appropriate because: Continue with management of MRSA bacteremia, axillary abscess, bilateral pulmonary septic emboli. Empyema/     Consultants:  ID Surgery   Procedures:  TEE 8/18 negative for endocarditis.   Antimicrobials:  Vancomycin   Subjective: No new complaints. He is breathing better. \  Objective: Vitals:   02/16/22 2100 02/17/22 0500 02/17/22 0700 02/17/22 0743  BP: 104/68 (S) (!) 87/45  (!) 100/50  Pulse: 96  92 89  Resp:   (!) 21 12  Temp:  97.8 F (36.6 C)    TempSrc:  Axillary    SpO2: 96%  98% 97%  Weight:  78 kg    Height:        Intake/Output Summary (Last 24 hours) at  02/17/2022 0828 Last data filed at 02/17/2022 0086 Gross per 24 hour  Intake 2182.77 ml  Output 1302 ml  Net 880.77 ml    Filed Weights   02/15/22 0500 02/16/22 0500 02/17/22 0500  Weight: 79.5 kg 75.2 kg 78 kg     Examination:  General exam: NAD Respiratory system: BL air movement, chest tube in place Cardiovascular system: S 1, S 2 RRR Gastrointestinal system: BS present, soft,nt Central nervous system:Alert, conversant.  Extremities: no edema    Data Reviewed: I have personally reviewed following labs and imaging studies  CBC: Recent Labs  Lab 02/11/22 0341 02/12/22 0449 02/14/22 0459 02/15/22 0623 02/16/22 0600 02/17/22 0606  WBC 12.1* 9.6 10.6* 10.7* 11.4* 8.9  NEUTROABS 8.7*  --   --   --   --   --   HGB 11.5* 10.2* 10.4* 10.5* 10.5* 9.2*  HCT 34.3* 29.8* 31.2* 31.5* 32.3* 28.3*  MCV 81.9 80.3 80.8 82.9 83.7 83.0  PLT 185 238 391 434* 440* 547*    Basic Metabolic Panel: Recent Labs  Lab 02/11/22 0341 02/12/22 0449 02/14/22 0459 02/15/22 0623 02/16/22 0600  NA 131* 131* 132* 131* 131*  K 3.6 3.5 4.3 4.3 4.4  CL 92* 94* 99 98 97*  CO2 '28 28 24 25 25  '$ GLUCOSE 108* 107* 103* 109* 132*  BUN 5* '6 10 17 15  '$ CREATININE 0.57* 0.65 0.78 0.73 0.69  CALCIUM 7.8* 7.4* 7.8* 7.8* 7.5*  MG 1.9  --  1.8  --   --     GFR: Estimated Creatinine Clearance: 144.2 mL/min (by C-G formula based on SCr of 0.69 mg/dL). Liver Function Tests: No results for input(s): "AST", "ALT", "ALKPHOS", "BILITOT", "PROT", "ALBUMIN" in the last 168 hours.  No results for input(s): "LIPASE", "AMYLASE" in the last 168 hours. No results for input(s): "AMMONIA" in the last 168 hours. Coagulation Profile: No results for input(s): "INR", "PROTIME" in the last 168 hours.  Cardiac Enzymes: No results for input(s): "CKTOTAL", "CKMB", "CKMBINDEX", "TROPONINI" in the last 168 hours. BNP (last 3 results) No results for input(s): "PROBNP" in the last 8760 hours. HbA1C: No results for input(s): "HGBA1C" in the last 72 hours. CBG: Recent Labs  Lab 02/11/22 1756  GLUCAP 158*    Lipid Profile: No results for input(s): "CHOL", "HDL", "LDLCALC", "TRIG", "CHOLHDL", "LDLDIRECT" in the last 72  hours. Thyroid Function Tests: No results for input(s): "TSH", "T4TOTAL", "FREET4", "T3FREE", "THYROIDAB" in the last 72 hours.  Anemia Panel: No results for input(s): "VITAMINB12", "FOLATE", "FERRITIN", "TIBC", "IRON", "RETICCTPCT" in the last 72 hours. Sepsis Labs: No results for input(s): "PROCALCITON", "LATICACIDVEN" in the last 168 hours.   Recent Results (from the past 240 hour(s))  Blood Culture (routine x 2)     Status: Abnormal   Collection Time: 02/07/22  5:01 PM   Specimen: BLOOD RIGHT HAND  Result Value Ref Range Status   Specimen Description   Final    BLOOD RIGHT HAND Performed at [facility], [address] [place], [place] [address]    Special Requests   Final    BOTTLES DRAWN AEROBIC AND ANAEROBIC Blood Culture results may not be optimal due to an inadequate volume of blood received in culture bottles Performed at [facility], [address], [place], [place] [address]    Culture  Setup Time   Final    GRAM POSITIVE COCCI BOTTLES DRAWN AEROBIC AND ANAEROBIC Gram Stain Report Called to,Read Back By and Verified With: GUILBEAULT,D '@0745'$  BY MATTHEWS, B 8.16.2023  IN BOTH AEROBIC AND ANAEROBIC BOTTLES    Culture (A)  Final    STAPHYLOCOCCUS AUREUS SUSCEPTIBILITIES PERFORMED ON PREVIOUS CULTURE WITHIN THE LAST 5 DAYS. Performed at [facility], [place] [address], [place], [place] [address]    Report Status 02/10/2022 FINAL  Final  Blood Culture (routine x 2)     Status: Abnormal   Collection Time: 02/07/22  5:01 PM   Specimen: BLOOD RIGHT ARM  Result Value Ref Range Status   Specimen Description   Final    BLOOD RIGHT ARM Performed at [facility], [address], [place], [place] [address]    Special Requests   Final    BOTTLES DRAWN AEROBIC AND ANAEROBIC Blood Culture results may not be optimal due to an inadequate volume of blood received in culture bottles Performed at [place] [place], [place] [address]    Culture  Setup Time   Final     GRAM POSITIVE COCCI BOTTLES DRAWN AEROBIC AND ANAEROBIC Gram Stain Report Called to,Read Back By and Verified With: GUILBEAULT,D'@0745'$  BY MATTHEWS, B 8.16.2023 IN BOTH AEROBIC AND ANAEROBIC BOTTLES GRAM STAIN REVIEWED-AGREE WITH RESULT CRITICAL RESULT CALLED TO, READ BACK BY AND VERIFIED WITH: Noemi Chapel [phone] AT 1411 BY EC Performed at [facility], [place] [address], [place],  [address]    Culture METHICILLIN RESISTANT STAPHYLOCOCCUS AUREUS (A)  Final   Report Status 02/10/2022 FINAL  Final   Organism ID, Bacteria METHICILLIN RESISTANT STAPHYLOCOCCUS AUREUS  Final      Susceptibility   Methicillin resistant staphylococcus aureus - MIC*    CIPROFLOXACIN >=8 RESISTANT Resistant     ERYTHROMYCIN >=8 RESISTANT Resistant     GENTAMICIN <=0.5 SENSITIVE Sensitive     OXACILLIN >=4 RESISTANT Resistant     TETRACYCLINE >=16 RESISTANT Resistant     VANCOMYCIN <=0.5 SENSITIVE Sensitive     TRIMETH/SULFA >=320 RESISTANT Resistant     CLINDAMYCIN <=0.25 SENSITIVE Sensitive     RIFAMPIN <=0.5 SENSITIVE Sensitive     Inducible Clindamycin NEGATIVE Sensitive     * METHICILLIN RESISTANT STAPHYLOCOCCUS AUREUS  Blood Culture ID Panel (Reflexed)     Status: Abnormal   Collection Time: 02/07/22  5:01 PM  Result Value Ref Range Status   Enterococcus faecalis NOT DETECTED NOT DETECTED Final   Enterococcus Faecium NOT DETECTED NOT DETECTED Final   Listeria monocytogenes NOT DETECTED NOT DETECTED Final   Staphylococcus species DETECTED (A) NOT DETECTED Final    Comment: CRITICAL RESULT CALLED TO, READ BACK BY AND VERIFIED WITH: PHARMD Alycia Rossetti [phone] AT 1411 BY EC    Staphylococcus aureus (BCID) DETECTED (A) NOT DETECTED Final    Comment: Methicillin (oxacillin)-resistant Staphylococcus aureus (MRSA). MRSA is predictably resistant to beta-lactam antibiotics (except ceftaroline). Preferred therapy is vancomycin unless clinically contraindicated. Patient requires  contact precautions if  hospitalized. CRITICAL RESULT CALLED TO, READ BACK BY AND VERIFIED WITH: [place] [phone] AT 9470 BY EC    Staphylococcus epidermidis NOT DETECTED NOT DETECTED Final   Staphylococcus lugdunensis NOT DETECTED NOT DETECTED Final   Streptococcus species NOT DETECTED NOT DETECTED Final   Streptococcus agalactiae NOT DETECTED NOT DETECTED Final   Streptococcus pneumoniae NOT DETECTED NOT DETECTED Final   Streptococcus pyogenes NOT DETECTED NOT DETECTED Final   A.calcoaceticus-baumannii NOT DETECTED NOT DETECTED Final   Bacteroides fragilis NOT DETECTED NOT DETECTED Final   Enterobacterales NOT DETECTED NOT DETECTED Final   Enterobacter cloacae complex NOT DETECTED NOT DETECTED Final  Escherichia coli NOT DETECTED NOT DETECTED Final   Klebsiella aerogenes NOT DETECTED NOT DETECTED Final   Klebsiella oxytoca NOT DETECTED NOT DETECTED Final   Klebsiella pneumoniae NOT DETECTED NOT DETECTED Final   Proteus species NOT DETECTED NOT DETECTED Final   Salmonella species NOT DETECTED NOT DETECTED Final   Serratia marcescens NOT DETECTED NOT DETECTED Final   Haemophilus influenzae NOT DETECTED NOT DETECTED Final   Neisseria meningitidis NOT DETECTED NOT DETECTED Final   Pseudomonas aeruginosa NOT DETECTED NOT DETECTED Final   Stenotrophomonas maltophilia NOT DETECTED NOT DETECTED Final   Candida albicans NOT DETECTED NOT DETECTED Final   Candida auris NOT DETECTED NOT DETECTED Final   Candida glabrata NOT DETECTED NOT DETECTED Final   Candida krusei NOT DETECTED NOT DETECTED Final   Candida parapsilosis NOT DETECTED NOT DETECTED Final   Candida tropicalis NOT DETECTED NOT DETECTED Final   Cryptococcus neoformans/gattii NOT DETECTED NOT DETECTED Final   Meth resistant mecA/C and MREJ DETECTED (A) NOT DETECTED Final    Comment: CRITICAL RESULT CALLED TO, READ BACK BY AND VERIFIED WITH: Noemi Chapel [phone] AT 1411 BY EC Performed at [facility], [place] [address], [place], [place] [address]   Resp Panel by RT-PCR (Flu A&B, Covid) Anterior Nasal Swab     Status: None   Collection Time: 02/07/22  5:20 PM   Specimen: Anterior Nasal Swab  Result Value Ref Range Status   SARS Coronavirus 2 by RT PCR NEGATIVE NEGATIVE Final    Comment: (NOTE) SARS-CoV-2 target nucleic acids are NOT DETECTED.  The SARS-CoV-2 RNA is generally detectable in upper respiratory specimens during the acute phase of infection. The lowest concentration of SARS-CoV-2 viral copies this assay can detect is 138 copies/mL. A negative result does not preclude SARS-Cov-2 infection and should not be used as the sole basis for treatment or other patient management decisions. A negative result may occur with  improper specimen collection/handling, submission of specimen other than nasopharyngeal swab, presence of viral mutation(s) within the areas targeted by this assay, and inadequate number of viral copies(<138 copies/mL). A negative result must be combined with clinical observations, patient history, and epidemiological information. The expected result is Negative.  Fact Sheet for Patients:  EntrepreneurPulse.com.au  Fact Sheet for Healthcare Providers:  IncredibleEmployment.be  This test is no t yet approved or cleared by the [place] FDA and  has been authorized for detection and/or diagnosis of SARS-CoV-2 by FDA under an Emergency Use Authorization (EUA). This EUA will remain  in effect (meaning this test can be used) for the duration of the COVID-19 declaration under Section 564(b)(1) of the Act, 21 U.S.C.section 360bbb-3(b)(1), unless the authorization is terminated  or revoked sooner.       Influenza A by PCR NEGATIVE NEGATIVE Final   Influenza B by PCR NEGATIVE NEGATIVE Final    Comment: (NOTE) The Xpert Xpress SARS-CoV-2/FLU/RSV plus assay is intended as an aid in the diagnosis of influenza from  Nasopharyngeal swab specimens and should not be used as a sole basis for treatment. Nasal washings and aspirates are unacceptable for Xpert Xpress SARS-CoV-2/FLU/RSV testing.  Fact Sheet for Patients: EntrepreneurPulse.com.au  Fact Sheet for Healthcare Providers: IncredibleEmployment.be  This test is not yet approved or cleared by the [place] FDA and has been authorized for detection and/or diagnosis of SARS-CoV-2 by FDA under an Emergency Use Authorization (EUA). This EUA will remain in effect (meaning this test can be used) for the duration of the COVID-19 declaration under  Section 564(b)(1) of the Act, 21 U.S.C. section 360bbb-3(b)(1), unless the authorization is terminated or revoked.  Performed at [facility], [address], [place], [place] [address]   Urine Culture     Status: None   Collection Time: 02/07/22 11:32 PM   Specimen: In/Out Cath Urine  Result Value Ref Range Status   Specimen Description IN/OUT CATH URINE  Final   Special Requests NONE  Final   Culture   Final    NO GROWTH Performed at [facility], [place] [address], [place], [place] [address]    Report Status 02/08/2022 FINAL  Final  Culture, blood (Routine X 2) w Reflex to ID Panel     Status: None   Collection Time: 02/09/22  4:32 AM   Specimen: BLOOD RIGHT HAND  Result Value Ref Range Status   Specimen Description BLOOD RIGHT HAND  Final   Special Requests   Final    AEROBIC BOTTLE ONLY Blood Culture results may not be optimal due to an excessive volume of blood received in culture bottles   Culture   Final    NO GROWTH 5 DAYS Performed at [facility], [place] [address] [place], [place] [address]    Report Status 02/14/2022 FINAL  Final  Culture, blood (Routine X 2) w Reflex to ID Panel     Status: None   Collection Time: 02/09/22  4:32 AM   Specimen: BLOOD LEFT HAND  Result Value Ref Range Status   Specimen Description BLOOD LEFT HAND  Final    Special Requests   Final    AEROBIC BOTTLE ONLY Blood Culture results may not be optimal due to an excessive volume of blood received in culture bottles   Culture   Final    NO GROWTH 5 DAYS Performed at [facility], [place] [address] [place], [place] [address]    Report Status 02/14/2022 FINAL  Final  Culture, blood (Routine X 2) w Reflex to ID Panel     Status: None   Collection Time: 02/11/22  3:49 AM   Specimen: BLOOD LEFT HAND  Result Value Ref Range Status   Specimen Description BLOOD LEFT HAND  Final   Special Requests   Final    BOTTLES DRAWN AEROBIC ONLY Blood Culture results may not be optimal due to an inadequate volume of blood received in culture bottles   Culture   Final    NO GROWTH 5 DAYS Performed at [facility], [place] [address] [place], [place] [address]    Report Status 02/16/2022 FINAL  Final  Culture, blood (Routine X 2) w Reflex to ID Panel     Status: None   Collection Time: 02/11/22  3:57 AM   Specimen: BLOOD RIGHT HAND  Result Value Ref Range Status   Specimen Description BLOOD RIGHT HAND  Final   Special Requests   Final    BOTTLES DRAWN AEROBIC ONLY Blood Culture results may not be optimal due to an inadequate volume of blood received in culture bottles   Culture   Final    NO GROWTH 5 DAYS Performed at [facility], [place] [address], [place],  [address]    Report Status 02/16/2022 FINAL  Final  Body fluid culture w Gram Stain     Status: None   Collection Time: 02/12/22  4:46 PM   Specimen: Pleural Fluid  Result Value Ref Range Status   Specimen Description FLUID  Final   Special Requests  Immunocompromised  Final   Gram Stain NO WBC SEEN NO ORGANISMS SEEN   Final   Culture   Final    NO GROWTH 3 DAYS Performed at [facility], [place] [address] [place], [place] [address]    Report Status 02/16/2022 FINAL  Final  Fungus Culture With Stain     Status: None (Preliminary result)   Collection Time: 02/12/22  4:46 PM    Specimen: Pleural Fluid  Result Value Ref Range Status   Fungus Stain Final report  Final    Comment: (NOTE) Performed At: [facility] [place], [place] [id number] Rush Farmer MD RW:[id number]    Fungus (Mycology) Culture PENDING  Incomplete   Fungal Source FLUID  Final    Comment: Performed at [facility], [place] [address], [place], [place] [address]  Fungus Culture Result     Status: None   Collection Time: 02/12/22  4:46 PM  Result Value Ref Range Status   Result 1 Comment  Final    Comment: (NOTE) KOH/Calcofluor preparation:  no fungus observed. Performed At: [facility] [place], [place] [id number] Rush Farmer MD YK:[id number]   MRSA Next Gen by PCR, Nasal     Status: Abnormal   Collection Time: 02/14/22  7:22 PM   Specimen: Nasal Mucosa; Nasal Swab  Result Value Ref Range Status   MRSA by PCR Next Gen DETECTED (A) NOT DETECTED Final    Comment: RESULT CALLED TO, READ BACK BY AND VERIFIED WITH: RN Eulogio Ditch [phone] '@2311'$  FH (NOTE) The GeneXpert MRSA Assay (FDA approved for NASAL specimens only), is one component of a comprehensive MRSA colonization surveillance program. It is not intended to diagnose MRSA infection nor to guide or monitor treatment for MRSA infections. Test performance is not FDA approved in patients less than 61 years old. Performed at [facility], [place] [address], [place], [place] [address]          Radiology Studies: DG CHEST PORT 1 VIEW  Result Date: 02/16/2022 CLINICAL DATA:  [id number]; right chest tube, pleural effusion, follow-up study EXAM: PORTABLE CHEST 1 VIEW COMPARISON:  Chest x-ray from yesterday FINDINGS: Again seen is the pigtail right chest tube at the right lower lung. The chest tube is kinked at the insertion site. Cardiomediastinal silhouette is enlarged and stable. There has been interval decrease in the right pleural effusion. There has been interval improvement of the  interstitial edema. There are patchy opacity seen in the bilateral mid-lower lungs greater at the retrocardiac region and have mildly improved in the interim as well. The visualized skeletal structures are unremarkable. IMPRESSION: 1. There is a pigtail right chest tube at lower lung. There is a kink at the catheter insertion site. There has been interval decrease in the right pleural effusion. 2. There has been interval improvement of the interstitial edema as well as the patchy bilateral opacities likely on the basis of atelectasis-infiltrations. Electronically Signed   By: Frazier Richards M.D.   On: 02/16/2022 07:59        Scheduled Meds:  bictegravir-emtricitabine-tenofovir AF  1 tablet Oral Daily   Chlorhexidine Gluconate Cloth  6 each Topical Daily   enoxaparin (LOVENOX) injection  40 mg Subcutaneous Q24H   fluconazole  200 mg Oral Daily   guaiFENesin  600 mg Oral BID   ketorolac  15 mg Intravenous Q6H   lidocaine  2 patch Transdermal Q24H   polyethylene glycol  17 g Oral BID  senna-docusate  1 tablet Oral BID   sodium chloride flush  10 mL Intrapleural Q8H   sodium chloride flush  10 mL Intrapleural Q8H   sodium chloride flush  10 mL Intrapleural Q8H   sodium chloride flush  3 mL Intravenous Q12H   sulfamethoxazole-trimethoprim  1 tablet Oral Once per day on Mon Wed Fri   Continuous Infusions:  vancomycin 1,250 mg (02/17/22 0117)     LOS: 10 days    Time spent: 35 minutes    Krisy Dix A Junetta Hearn, MD [facility]   If 7PM-7AM, please contact night-coverage [email or web address]  02/17/2022, 8:28 AM

## 2022-02-17 NOTE — Progress Notes (Signed)
Pharmacy Antibiotic Note  Rick Lam is a 31 y.o. male admitted on 02/07/2022 with MRSA bacteremia.  Pharmacy has been consulted for vancomycin dosing.  Vancomycin peak (drawn ~2.5 hours late) is 22, vancomycin trough (drawn 69mn into next dose) is 34. These levels are inaccurate due to the times they were drawn and will not be used to calculate an AUC or dose adjustment. The patient has been stable on his current regimen since 8/18 with previous AUC of 519 on 8/21. Scr 0.64, WBC 8.9, afebrile--stable. Will continue his current vancomycin regimen.    Plan: Continue vancomycin '1250mg'$  IV q8h Consider obtaining new levels this weekend, 8/26-8/27 Monitor renal fxn, cx, and WBC daily, vanc levels PRN  Height: 6' (182.9 cm) Weight: 78 kg (171 lb 15.3 oz) IBW/kg (Calculated) : 77.6  Temp (24hrs), Avg:97.8 F (36.6 C), Min:97.7 F (36.5 C), Max:98 F (36.7 C)  Recent Labs  Lab 02/12/22 0449 02/13/22 1135 02/13/22 1612 02/14/22 0459 02/15/22 0623 02/16/22 0600 02/17/22 0606 02/17/22 0940  WBC 9.6  --   --  10.6* 10.7* 11.4* 8.9  --   CREATININE 0.65  --   --  0.78 0.73 0.69 0.64  --   VANCOTROUGH  --   --  11*  --   --   --   --  34*  VANCOPEAK  --  31  --   --   --   --  22*  --      Estimated Creatinine Clearance: 144.2 mL/min (by C-G formula based on SCr of 0.64 mg/dL).    Allergies  Allergen Reactions   Banana     Other reaction(s): hives    Antimicrobials this admission: cefepime 8/15 >> 8/16 Bactrim 8/16 (for PJP ppx) >> metronidazole 8/15 '500mg'$  x 1 vancomycin 8/15 >> Fluconazole PO 8/21 >> (9/2)   Dose adjustments this admission: Vanco 1000 q8h >> '1250mg'$  q8h on 8/18  Microbiology results: 8/15 BCID: MRSA 8/15 Ucx: NG 8/17 Bcx: NG 8/19 Bcx: NG 8/20 pleural fl cx: NG 8/20 pleural fl fungus cx: NG   SBilley Gosling PharmD PGY1 Pharmacy Resident 8/25/20231:36 PM

## 2022-02-18 ENCOUNTER — Inpatient Hospital Stay (HOSPITAL_COMMUNITY): Payer: Self-pay

## 2022-02-18 LAB — BASIC METABOLIC PANEL
Anion gap: 5 (ref 5–15)
BUN: 14 mg/dL (ref 6–20)
CO2: 26 mmol/L (ref 22–32)
Calcium: 7.7 mg/dL — ABNORMAL LOW (ref 8.9–10.3)
Chloride: 104 mmol/L (ref 98–111)
Creatinine, Ser: 0.68 mg/dL (ref 0.61–1.24)
GFR, Estimated: >60 mL/min (ref >60)
Glucose, Bld: 91 mg/dL (ref 70–99)
Potassium: 4.6 mmol/L (ref 3.5–5.1)
Sodium: 135 mmol/L (ref 135–145)

## 2022-02-18 LAB — CBC
HCT: 27 % — ABNORMAL LOW (ref 39.0–52.0)
Hemoglobin: 8.7 g/dL — ABNORMAL LOW (ref 13.0–17.0)
MCH: 26.9 pg (ref 26.0–34.0)
MCHC: 32.2 g/dL (ref 30.0–36.0)
MCV: 83.6 fL (ref 80.0–100.0)
Platelets: 630 10*3/uL — ABNORMAL HIGH (ref 150–400)
RBC: 3.23 MIL/uL — ABNORMAL LOW (ref 4.22–5.81)
RDW: 16.4 % — ABNORMAL HIGH (ref 11.5–15.5)
WBC: 6.1 10*3/uL (ref 4.0–10.5)
nRBC: 0 % (ref 0.0–0.2)
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
## 2022-02-18 NOTE — Progress Notes (Signed)
Patient transferred from 2H-5 to 4E-24 with chest tube and telemetry.  Patient tolerated transport without difficulty. All patient belongings transferred with patient. Patients mother at bedside and aware of transfer.

## 2022-02-18 NOTE — Progress Notes (Signed)
PROGRESS NOTE    Rick Lam  AVW:[id number] DOB: Dec 10, 1990 DOA: 02/07/2022 PCP: No primary care provider on file.   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  Presented with painful left axillary nodules with surrounding erythema he was admitted with sepsis, found to have MRSA bacteremia secondary to axillary abscess and cellulitis.  He underwent TEE 8/18 which was negative for endocarditis.  ID is following.  Patient developed worsening respiratory distress 8/20.  He was found to have a large right-sided pleural effusion, empyema.  Chest tube was placed.  Transfer to ICU 8/22 secondary to worsening respiratory failure in the setting of pigtail kink.     Assessment & Plan:   Principal Problem:   Severe sepsis ([place]) Active Problems:   AIDS (acquired immune deficiency syndrome) (HCC)   Oral thrush   Hyponatremia   Acute respiratory failure with hypoxia (HCC)   Acute septic pulmonary embolism without acute cor pulmonale (HCC)   Cutaneous abscess of left upper extremity   Pleural effusion   Empyema (HCC)   1-Sepsis secondary to MRSA bacteremia,  bilateral likely pulmonary septic emboli, Left arm cellulitis and abscess, Empyema -Patient immunosuppressed, in the setting of HIV/AIDS. -Blood cultures from 8/15 positive for MRSA. -Repeated blood culture 8/17 no growth to date. Blood culture 8/19: No growth to date.  -MRI left upper extremity with a small abscess x2 evaluated by Ortho, did not recommend drainage at that time. -TEE 8/18 negative for endocarditis -Pulmonology recommended repeat CT chest at the end of treatment for MRSA if lesions persist consider outpatient bronchoscopy. -Continue with IV vancomycin. -Surgery consulted for further evaluation of axillary abscess on 8/19. -Surgery recommend warm compress, IV antibiotics, no need for drainage.    Acute hypoxic respiratory failure: Bilateral pulmonary nodules, concern with septic emboli Right side Empyema:  Continue with oxygen supplementation.  Incentive spirometry/ Flutter Valve S/P pigtail catheter placement right  8/20. CCM following,  Third dose of lytics through catheter 8/24. Pleural fluid, no growth to date.  Chest x ray 8/25: Stable probable bilateral pulmonary edema with left pleural effusion Follow Pulmonary recommendations.   HIV/AIDS: Continue with Biktarvy and Bactrim ID following, appreciate assistance  Hyponatremia: Could be combination of dehydration and some component of SIADH Sodium improved with hydration.  AKI: Resolved Oral thrush; odynophagia; On diflucan, needs 2 weeks treatment.   Estimated body mass index is 22.38 kg/m as calculated from the following:   Height as of this encounter: 6' (1.829 m).   Weight as of this encounter: 74.8 kg.   DVT prophylaxis: Lovenox Code Status: Full code Family Communication: Care discussed with patient and mother at bedside. /8/23.  Disposition Plan:  Status is: Inpatient Remains inpatient appropriate because: Continue with management of MRSA bacteremia, axillary abscess, bilateral pulmonary septic emboli. Empyema/     Consultants:  ID Surgery   Procedures:  TEE 8/18 negative for endocarditis.   Antimicrobials:  Vancomycin   Subjective: Sitting up in chair on room air, denies sob, no cough, denies pain Tolerating chest tube on right side No new complaints.   Objective: Vitals:   02/18/22 0800 02/18/22 0844 02/18/22 1000 02/18/22 1100  BP: (!) 104/54  (!) 110/53   Pulse: 92 (!) 101 88   Resp: 12  16   Temp:    98 F (36.7 C)  TempSrc:    Oral  SpO2: 97% 93% 94%   Weight:  74.8 kg  Height:    6' (1.829 m)    Intake/Output Summary (Last 24 hours) at 02/18/2022 1251 Last data filed at 02/18/2022 1022 Gross per 24 hour  Intake 1274.92 ml  Output 1020 ml  Net 254.92 ml   Filed  Weights   02/16/22 0500 02/17/22 0500 02/18/22 1100  Weight: 75.2 kg 78 kg 74.8 kg    Examination:  General exam: NAD Respiratory system: BL air movement, chest tube in place Cardiovascular system: S 1, S 2 RRR Gastrointestinal system: BS present, soft,nt Central nervous system:Alert, conversant.  Extremities: no edema, left upper arm cellulitis appears has almost resolved     Data Reviewed: I have personally reviewed following labs and imaging studies  CBC: Recent Labs  Lab 02/14/22 0459 02/15/22 0623 02/16/22 0600 02/17/22 0606 02/18/22 0639  WBC 10.6* 10.7* 11.4* 8.9 6.1  HGB 10.4* 10.5* 10.5* 9.2* 8.7*  HCT 31.2* 31.5* 32.3* 28.3* 27.0*  MCV 80.8 82.9 83.7 83.0 83.6  PLT 391 434* 440* 547* 810*   Basic Metabolic Panel: Recent Labs  Lab 02/14/22 0459 02/15/22 0623 02/16/22 0600 02/17/22 0606 02/18/22 0639  NA 132* 131* 131* 131* 135  K 4.3 4.3 4.4 4.1 4.6  CL 99 98 97* 102 104  CO2 '24 25 25 27 26  '$ GLUCOSE 103* 109* 132* 94 91  BUN '10 17 15 14 14  '$ CREATININE 0.78 0.73 0.69 0.64 0.68  CALCIUM 7.8* 7.8* 7.5* 7.2* 7.7*  MG 1.8  --   --   --   --    GFR: Estimated Creatinine Clearance: 139 mL/min (by C-G formula based on SCr of 0.68 mg/dL). Liver Function Tests: No results for input(s): "AST", "ALT", "ALKPHOS", "BILITOT", "PROT", "ALBUMIN" in the last 168 hours.  No results for input(s): "LIPASE", "AMYLASE" in the last 168 hours. No results for input(s): "AMMONIA" in the last 168 hours. Coagulation Profile: No results for input(s): "INR", "PROTIME" in the last 168 hours.  Cardiac Enzymes: No results for input(s): "CKTOTAL", "CKMB", "CKMBINDEX", "TROPONINI" in the last 168 hours. BNP (last 3 results) No results for input(s): "PROBNP" in the last 8760 hours. HbA1C: No results for input(s): "HGBA1C" in the last 72 hours. CBG: Recent Labs  Lab 02/11/22 1756  GLUCAP 158*   Lipid Profile: No results for input(s): "CHOL", "HDL", "LDLCALC", "TRIG",  "CHOLHDL", "LDLDIRECT" in the last 72 hours. Thyroid Function Tests: No results for input(s): "TSH", "T4TOTAL", "FREET4", "T3FREE", "THYROIDAB" in the last 72 hours.  Anemia Panel: No results for input(s): "VITAMINB12", "FOLATE", "FERRITIN", "TIBC", "IRON", "RETICCTPCT" in the last 72 hours. Sepsis Labs: No results for input(s): "PROCALCITON", "LATICACIDVEN" in the last 168 hours.   Recent Results (from the past 240 hour(s))  Culture, blood (Routine X 2) w Reflex to ID Panel     Status: None   Collection Time: 02/09/22  4:32 AM   Specimen: BLOOD RIGHT HAND  Result Value Ref Range Status   Specimen Description BLOOD RIGHT HAND  Final   Special Requests   Final    AEROBIC BOTTLE ONLY Blood Culture results may not be optimal due to an excessive volume of blood received in culture bottles   Culture   Final    NO GROWTH 5 DAYS Performed at [facility], [place] [address], [place], [place] [address]    Report Status 02/14/2022 FINAL  Final  Culture, blood (Routine X 2) w Reflex to ID Panel     Status: None   Collection Time: 02/09/22  4:32 AM  Specimen: BLOOD LEFT HAND  Result Value Ref Range Status   Specimen Description BLOOD LEFT HAND  Final   Special Requests   Final    AEROBIC BOTTLE ONLY Blood Culture results may not be optimal due to an excessive volume of blood received in culture bottles   Culture   Final    NO GROWTH 5 DAYS Performed at [facility], [place] [address], [place], [place] [address]    Report Status 02/14/2022 FINAL  Final  Culture, blood (Routine X 2) w Reflex to ID Panel     Status: None   Collection Time: 02/11/22  3:49 AM   Specimen: BLOOD LEFT HAND  Result Value Ref Range Status   Specimen Description BLOOD LEFT HAND  Final   Special Requests   Final    BOTTLES DRAWN AEROBIC ONLY Blood Culture results may not be optimal due to an inadequate volume of blood received in culture bottles   Culture   Final    NO GROWTH 5 DAYS Performed at [facility], [place] [address], [place], [place] [address]    Report Status 02/16/2022 FINAL  Final  Culture, blood (Routine X 2) w Reflex to ID Panel     Status: None   Collection Time: 02/11/22  3:57 AM   Specimen: BLOOD RIGHT HAND  Result Value Ref Range Status   Specimen Description BLOOD RIGHT HAND  Final   Special Requests   Final    BOTTLES DRAWN AEROBIC ONLY Blood Culture results may not be optimal due to an inadequate volume of blood received in culture bottles   Culture   Final    NO GROWTH 5 DAYS Performed at [facility], [place] [address], [place], [place] [address]    Report Status 02/16/2022 FINAL  Final  Body fluid culture w Gram Stain     Status: None   Collection Time: 02/12/22  4:46 PM   Specimen: Pleural Fluid  Result Value Ref Range Status   Specimen Description FLUID  Final   Special Requests Immunocompromised  Final   Gram Stain NO WBC SEEN NO ORGANISMS SEEN   Final   Culture   Final    NO GROWTH 3 DAYS Performed at [facility] [address] [place], [place] [address]    Report Status 02/16/2022 FINAL  Final  Fungus Culture With Stain     Status: None (Preliminary result)   Collection Time: 02/12/22  4:46 PM   Specimen: Pleural Fluid  Result Value Ref Range Status   Fungus Stain Final report  Final    Comment: (NOTE) Performed At: [facility] [place], [place] [id number] Rush Farmer MD IH:[id number]    Fungus (Mycology) Culture PENDING  Incomplete   Fungal Source FLUID  Final    Comment: Performed at [facility], [place] [address], [place], [place] [address]  Fungus Culture Result     Status: None   Collection Time: 02/12/22  4:46 PM  Result Value Ref Range Status   Result 1 Comment  Final    Comment: (NOTE) KOH/Calcofluor preparation:  no fungus observed. Performed At: [facility] [place], [place] [id number] Rush Farmer MD YS:[id number]   MRSA Next Gen by PCR, Nasal     Status:  Abnormal   Collection Time: 02/14/22  7:22 PM   Specimen: Nasal Mucosa; Nasal Swab  Result Value Ref Range Status   MRSA by PCR Next Gen DETECTED (  A) NOT DETECTED Final    Comment: RESULT CALLED TO, READ BACK BY AND VERIFIED WITH: RN Eulogio Ditch [phone] '@[id number]'$  FH (NOTE) The GeneXpert MRSA Assay (FDA approved for NASAL specimens only), is one component of a comprehensive MRSA colonization surveillance program. It is not intended to diagnose MRSA infection nor to guide or monitor treatment for MRSA infections. Test performance is not FDA approved in patients less than 53 years old. Performed at [facility], [place] [address] [place], [place] [address]          Radiology Studies: DG CHEST PORT 1 VIEW  Result Date: 02/18/2022 CLINICAL DATA:  Pleural effusion EXAM: PORTABLE CHEST 1 VIEW COMPARISON:  February 17, 2022 FINDINGS: A right chest tube remains in place. Infiltrate in the right base is stable. Pleuroparenchymal thickening in the right apex is likely loculated pleural fluid given the February 13, 2022 chest x-ray. This finding is stable. Mild interstitial opacity elsewhere in the lungs is stable. The cardiomediastinal silhouette is unchanged. Continued left effusion with underlying opacity, similar in the interval. No pneumothorax. IMPRESSION: 1. The right chest tube is stable.  No pneumothorax. 2. Probable loculated fluid in the superior right pleural space, stable. Stable left effusion with underlying opacity. 3. Stable infiltrate in the right base. 4. Stable interstitial opacities in the lungs. Electronically Signed   By: Dorise Bullion III M.D.   On: 02/18/2022 08:20   DG CHEST PORT 1 VIEW  Result Date: 02/17/2022 CLINICAL DATA:  Pleural effusion. EXAM: PORTABLE CHEST 1 VIEW COMPARISON:  February 16, 2022. FINDINGS: Stable cardiomediastinal silhouette. Stable position of right-sided chest tube is noted. No definite pneumothorax is noted. Stable bilateral pulmonary edema is noted with  probable left pleural effusion. Bony thorax is unremarkable. IMPRESSION: Stable position of right-sided chest tube is noted. No definite pneumothorax is noted. There is again noted a kink involving the chest tube. Stable probable bilateral pulmonary edema with left pleural effusion. Electronically Signed   By: Marijo Conception M.D.   On: 02/17/2022 09:18        Scheduled Meds:  bictegravir-emtricitabine-tenofovir AF  1 tablet Oral Daily   Chlorhexidine Gluconate Cloth  6 each Topical Daily   enoxaparin (LOVENOX) injection  40 mg Subcutaneous Q24H   fluconazole  200 mg Oral Daily   guaiFENesin  600 mg Oral BID   ketorolac  15 mg Intravenous Q6H   lidocaine  2 patch Transdermal Q24H   polyethylene glycol  17 g Oral BID   senna-docusate  1 tablet Oral BID   sodium chloride flush  10 mL Intrapleural Q8H   sodium chloride flush  10 mL Intrapleural Q8H   sodium chloride flush  10 mL Intrapleural Q8H   sodium chloride flush  3 mL Intravenous Q12H   sulfamethoxazole-trimethoprim  1 tablet Oral Once per day on Mon Wed Fri   Continuous Infusions:  vancomycin 166.7 mL/hr at 02/18/22 1022     LOS: 11 days    Time spent: 35 minutes    Florencia Reasons, MD PhD FACP [facility]   If 7PM-7AM, please contact night-coverage [email or web address]  02/18/2022, 12:51 PM

## 2022-02-18 NOTE — Progress Notes (Signed)
   NAME:  Rick Lam, MRN:  [id number], DOB:  1990/12/31, LOS: 57 ADMISSION DATE:  02/07/2022, CONSULTATION DATE:  02/07/2022 REFERRING MD:  Roderic Palau, CHIEF COMPLAINT:  fever, chest discomfort, weakness   History of Present Illness:  Rick Lam is a 31 year old male with HIV/AIDS on biktarvy, chronic hepatitis C, latent syphillis s/p treatment who presented with painful left axillary nodules with erythema, found to have sepsis with MRSA bacteremia along with axillary abscess, cellulitis and septic pulmonary emboli.   TEE 8/18 negative for vegetations. ID is following and patient has been on vancomycin. PCCM consulted due to increasing respiratory distress and new right pleural effusion.  Pertinent Medical History:  HIV/AIDS [place] Late latent syphilis, treated Chronic HCV  Significant Hospital Events: Including procedures, antibiotic start and stop dates in addition to other pertinent events   02/06/22 transfer from [facility] ED to ED, started on vanc/cefepime 8/15 - CT Chest with innumerable pulmonary nodules bilaterally, several cavitated; asymmetrical skin thickening/SQ edema in L axilla c/f cellulitis. PCCM consulted. 8/16 - TTE with EF 65-70%, LE dopplers negative for DVT. MRI L humerus with severe upper arm cellulitis, two small 1.5cm abscesses. ID consulted 8/17 - Ortho consult for cellulitis, conservative management with IV abx. BCx positive for MRSA, vanc-sensitive. 8/18 - TEE with Cards, r/o vegetation. Persistent pain. Cellulitis/erythema improving. 8/20 - chest tube placed  8/21 CT of the chest 8/22 First dose of TPA/DNAse with 857m put of of chest tube 8/23 Second dose of TPA/DNAse with 1.8 L out  8/24 Third dose of TPA/DNAse   Interim History / Subjective:   90 cc out of the chest tube.  No complaints  Objective:  Blood pressure (!) 110/53, pulse 88, temperature 98.2 F (36.8 C), temperature source Oral, resp. rate 16, height 6' (1.829 m), weight 78 kg, SpO2 94 %.         Intake/Output Summary (Last 24 hours) at 02/18/2022 1031 Last data filed at 02/18/2022 1022 Gross per 24 hour  Intake 1511.35 ml  Output 1020 ml  Net 491.35 ml   Filed Weights   02/15/22 0500 02/16/22 0500 02/17/22 0500  Weight: 79.5 kg 75.2 kg 78 kg   Physical Examination: Gen:      No acute distress HEENT:  EOMI, sclera anicteric Neck:     No masses; no thyromegaly Lungs:    Clear to auscultation bilaterally; normal respiratory effort CV:         Regular rate and rhythm; no murmurs Abd:      + bowel sounds; soft, non-tender; no palpable masses, no distension Ext:    No edema; adequate peripheral perfusion Skin:      Warm and dry; no rash Neuro: alert and oriented x 3 Psych: normal mood and affect   Assessment & Plan:   Sepsis due to MRSA Bacteremia, septic pulmonary emboli and left arm abscess/cellulitis Acute Hypoxemic Respiratory Failure Right Pleural Effusion HIV/AIDS  Plan: S/p 3 doses of pleural lytic therapy His output has diminished from the chest tube Get CT to reevaluate  Signature:   PMarshell GarfinkelMD [facility] See Amion for pager  If no response to pager , please call [phone] until 7pm After 7:00 pm call Elink  [phone]/26/2023, 10:31 AM

## 2022-02-18 NOTE — Progress Notes (Signed)
Pt A/O x4, VS stable,  no pain at this time. RT  chest tube, Left UE cellulitis. Transferred to 4E24. Pt belonging (phone, charger, tooth brush/paste, clothes are with the patient. Mom at the bedside, updated)

## 2022-02-19 LAB — CBC
HCT: 30.2 % — ABNORMAL LOW (ref 39.0–52.0)
Hemoglobin: 10 g/dL — ABNORMAL LOW (ref 13.0–17.0)
MCH: 27.5 pg (ref 26.0–34.0)
MCHC: 33.1 g/dL (ref 30.0–36.0)
MCV: 83 fL (ref 80.0–100.0)
Platelets: 723 10*3/uL — ABNORMAL HIGH (ref 150–400)
RBC: 3.64 MIL/uL — ABNORMAL LOW (ref 4.22–5.81)
RDW: 16.1 % — ABNORMAL HIGH (ref 11.5–15.5)
WBC: 5.7 10*3/uL (ref 4.0–10.5)
nRBC: 0 % (ref 0.0–0.2)

## 2022-02-19 LAB — BASIC METABOLIC PANEL
Anion gap: 7 (ref 5–15)
BUN: 9 mg/dL (ref 6–20)
CO2: 25 mmol/L (ref 22–32)
Calcium: 8 mg/dL — ABNORMAL LOW (ref 8.9–10.3)
Chloride: 102 mmol/L (ref 98–111)
Creatinine, Ser: 0.63 mg/dL (ref 0.61–1.24)
GFR, Estimated: >60 mL/min (ref >60)
Glucose, Bld: 89 mg/dL (ref 70–99)
Potassium: 4.3 mmol/L (ref 3.5–5.1)
Sodium: 134 mmol/L — ABNORMAL LOW (ref 135–145)

## 2022-02-19 LAB — VANCOMYCIN, PEAK: Vancomycin Pk: 29 ug/mL — ABNORMAL LOW (ref 30–40)

## 2022-02-19 LAB — VANCOMYCIN, TROUGH: Vancomycin Tr: 18 ug/mL (ref 15–20)

## 2022-02-19 MED ORDER — VANCOMYCIN HCL 1250 MG/250ML IV SOLN
1250.0000 mg | Freq: Two times a day (BID) | INTRAVENOUS | Status: DC
Start: 2022-02-19 — End: 2022-02-21
  Administered 2022-02-19 – 2022-02-21 (×4): 1250 mg via INTRAVENOUS
  Filled 2022-02-19 (×4): qty 250

## 2022-02-19 NOTE — Progress Notes (Signed)
PROGRESS NOTE    Rick Lam  RKY:[id number] DOB: [birth year] DOA: 02/07/2022 PCP: No primary care provider on file.   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  Presented with painful left axillary nodules with surrounding erythema he was admitted with sepsis, found to have MRSA bacteremia secondary to axillary abscess and cellulitis.  He underwent TEE 8/18 which was negative for endocarditis.  ID is following.  Patient developed worsening respiratory distress 8/20.  He was found to have a large right-sided pleural effusion, empyema.  Chest tube was placed.  Transfer to ICU 8/22 secondary to worsening respiratory failure in the setting of pigtail kink.     Assessment & Plan:   Principal Problem:   Severe sepsis ([place]) Active Problems:   AIDS (acquired immune deficiency syndrome) (HCC)   Oral thrush   Hyponatremia   Acute respiratory failure with hypoxia (HCC)   Acute septic pulmonary embolism without acute cor pulmonale (HCC)   Cutaneous abscess of left upper extremity   Pleural effusion   Empyema (HCC)   1-Sepsis secondary to MRSA bacteremia,  bilateral likely pulmonary septic emboli, Left arm cellulitis and abscess, Empyema -Patient immunosuppressed, in the setting of HIV/AIDS. -Blood cultures from 8/15 positive for MRSA. -Repeated blood culture 8/17 no growth to date. Blood culture 8/19: No growth to date.  -MRI left upper extremity with a small abscess x2 evaluated by Ortho, did not recommend drainage at that time. -TEE 8/18 negative for endocarditis -Pulmonology recommended repeat CT chest at the end of treatment for MRSA if lesions persist consider outpatient bronchoscopy. -Continue with IV vancomycin. -Surgery consulted for further evaluation of axillary abscess on 8/19. -Surgery recommend warm compress, IV antibiotics, no need for drainage.    Acute hypoxic respiratory failure: Bilateral pulmonary nodules, concern with septic emboli Right side Empyema:  S/P pigtail catheter placement right  8/20 then lytics x3 , chest tube removed on 8/27 Pleural fluid, no growth to date.  Chest x ray 8/25: Stable probable bilateral pulmonary edema with left pleural effusion Repeat cxr pending Encourage ambulation, Incentive spirometry/ Flutter Valve Follow Pulmonary recommendations.   HIV/AIDS: Continue with Biktarvy and Bactrim ID following, appreciate assistance  Hyponatremia: Could be combination of dehydration and some component of SIADH Sodium improved with hydration.  AKI: Resolved Oral thrush; odynophagia; On diflucan, needs 2 weeks treatment.   Estimated body mass index is 22.38 kg/m as calculated from the following:   Height as of this encounter: 6' (1.829 m).   Weight as of this encounter: 74.8 kg.   DVT prophylaxis: Lovenox Code Status: Full code Family Communication: Care discussed with patient and mother at bedside. /8/23.  Disposition Plan:  Status is: Inpatient Remains inpatient appropriate because: Continue with management of MRSA bacteremia, axillary abscess, bilateral pulmonary septic emboli. Empyema/     Consultants:  ID Surgery   Procedures:  TEE 8/18 negative for endocarditis.   Antimicrobials:  Vancomycin   Subjective: Sitting up in chair on room air, denies sob, no cough, denies pain chest tube removed No new complaints. Reports feeling better, eager to get up to ambulate, he is wondering when he can go home  Objective: Vitals:   02/18/22 1100 02/18/22 1746 02/18/22 1932 02/19/22 0432  BP:  117/69 122/65 112/64  Pulse:  92 96 86  Resp:  '18 20 16  '$ Temp: 98 F (36.7 C) 98.4 F (36.9 C) 98.8 F (37.1 C) 98 F (  36.7 C)  TempSrc: Oral Oral Oral Oral  SpO2:  97% 95% 95%  Weight: 74.8 kg     Height: 6' (1.829 m)       Intake/Output Summary (Last 24 hours) at 02/19/2022 0843 Last data  filed at 02/19/2022 0700 Gross per 24 hour  Intake 904.93 ml  Output 1800 ml  Net -895.07 ml   Filed Weights   02/16/22 0500 02/17/22 0500 02/18/22 1100  Weight: 75.2 kg 78 kg 74.8 kg    Examination:  General exam: NAD Respiratory system: BL air movement, chest tube removed  Cardiovascular system: S 1, S 2 RRR Gastrointestinal system: BS present, soft,nt Central nervous system:Alert, conversant.  Extremities: no edema, left upper arm cellulitis appears has almost resolved     Data Reviewed: I have personally reviewed following labs and imaging studies  CBC: Recent Labs  Lab 02/15/22 0623 02/16/22 0600 02/17/22 0606 02/18/22 0639 02/19/22 0528  WBC 10.7* 11.4* 8.9 6.1 5.7  HGB 10.5* 10.5* 9.2* 8.7* 10.0*  HCT 31.5* 32.3* 28.3* 27.0* 30.2*  MCV 82.9 83.7 83.0 83.6 83.0  PLT 434* 440* 547* 630* 578*   Basic Metabolic Panel: Recent Labs  Lab 02/14/22 0459 02/15/22 0623 02/16/22 0600 02/17/22 0606 02/18/22 0639 02/19/22 0528  NA 132* 131* 131* 131* 135 134*  K 4.3 4.3 4.4 4.1 4.6 4.3  CL 99 98 97* 102 104 102  CO2 '24 25 25 27 26 25  '$ GLUCOSE 103* 109* 132* 94 91 89  BUN '10 17 15 14 14 9  '$ CREATININE 0.78 0.73 0.69 0.64 0.68 0.63  CALCIUM 7.8* 7.8* 7.5* 7.2* 7.7* 8.0*  MG 1.8  --   --   --   --   --    GFR: Estimated Creatinine Clearance: 139 mL/min (by C-G formula based on SCr of 0.63 mg/dL). Liver Function Tests: No results for input(s): "AST", "ALT", "ALKPHOS", "BILITOT", "PROT", "ALBUMIN" in the last 168 hours.  No results for input(s): "LIPASE", "AMYLASE" in the last 168 hours. No results for input(s): "AMMONIA" in the last 168 hours. Coagulation Profile: No results for input(s): "INR", "PROTIME" in the last 168 hours.  Cardiac Enzymes: No results for input(s): "CKTOTAL", "CKMB", "CKMBINDEX", "TROPONINI" in the last 168 hours. BNP (last 3 results) No results for input(s): "PROBNP" in the last 8760 hours. HbA1C: No results for input(s): "HGBA1C" in  the last 72 hours. CBG: No results for input(s): "GLUCAP" in the last 168 hours.  Lipid Profile: No results for input(s): "CHOL", "HDL", "LDLCALC", "TRIG", "CHOLHDL", "LDLDIRECT" in the last 72 hours. Thyroid Function Tests: No results for input(s): "TSH", "T4TOTAL", "FREET4", "T3FREE", "THYROIDAB" in the last 72 hours.  Anemia Panel: No results for input(s): "VITAMINB12", "FOLATE", "FERRITIN", "TIBC", "IRON", "RETICCTPCT" in the last 72 hours. Sepsis Labs: No results for input(s): "PROCALCITON", "LATICACIDVEN" in the last 168 hours.   Recent Results (from the past 240 hour(s))  Culture, blood (Routine X 2) w Reflex to ID Panel     Status: None   Collection Time: 02/11/22  3:49 AM   Specimen: BLOOD LEFT HAND  Result Value Ref Range Status   Specimen Description BLOOD LEFT HAND  Final   Special Requests   Final    BOTTLES DRAWN AEROBIC ONLY Blood Culture results may not be optimal due to an inadequate volume of blood received in culture bottles   Culture   Final    NO GROWTH 5 DAYS Performed at [facility], [place] [address] [place], [place] [address]  Report Status 02/16/2022 FINAL  Final  Culture, blood (Routine X 2) w Reflex to ID Panel     Status: None   Collection Time: 02/11/22  3:57 AM   Specimen: BLOOD RIGHT HAND  Result Value Ref Range Status   Specimen Description BLOOD RIGHT HAND  Final   Special Requests   Final    BOTTLES DRAWN AEROBIC ONLY Blood Culture results may not be optimal due to an inadequate volume of blood received in culture bottles   Culture   Final    NO GROWTH 5 DAYS Performed at [facility], [place] [address], [place], [place] [address]    Report Status 02/16/2022 FINAL  Final  Body fluid culture w Gram Stain     Status: None   Collection Time: 02/12/22  4:46 PM   Specimen: Pleural Fluid  Result Value Ref Range Status   Specimen Description FLUID  Final   Special Requests Immunocompromised  Final   Gram Stain NO WBC SEEN NO  ORGANISMS SEEN   Final   Culture   Final    NO GROWTH 3 DAYS Performed at [facility] [address], [place], [place] [address]    Report Status 02/16/2022 FINAL  Final  Fungus Culture With Stain     Status: None (Preliminary result)   Collection Time: 02/12/22  4:46 PM   Specimen: Pleural Fluid  Result Value Ref Range Status   Fungus Stain Final report  Final    Comment: (NOTE) Performed At: [facility] [place], [place] [id number] Rush Farmer MD GQ:[id number]    Fungus (Mycology) Culture PENDING  Incomplete   Fungal Source FLUID  Final    Comment: Performed at [facility], [place] [address], [place], [place] [address]  Fungus Culture Result     Status: None   Collection Time: 02/12/22  4:46 PM  Result Value Ref Range Status   Result 1 Comment  Final    Comment: (NOTE) KOH/Calcofluor preparation:  no fungus observed. Performed At: [facility] [place], [place] [id number] Rush Farmer MD VW:[id number]   MRSA Next Gen by PCR, Nasal     Status: Abnormal   Collection Time: 02/14/22  7:22 PM   Specimen: Nasal Mucosa; Nasal Swab  Result Value Ref Range Status   MRSA by PCR Next Gen DETECTED (A) NOT DETECTED Final    Comment: RESULT CALLED TO, READ BACK BY AND VERIFIED WITH: RN Eulogio Ditch [phone] '@2311'$  FH (NOTE) The GeneXpert MRSA Assay (FDA approved for NASAL specimens only), is one component of a comprehensive MRSA colonization surveillance program. It is not intended to diagnose MRSA infection nor to guide or monitor treatment for MRSA infections. Test performance is not FDA approved in patients less than 77 years old. Performed at [facility], [place] [address], [place], [place] [address]          Radiology Studies: CT CHEST WO CONTRAST  Result Date: 02/18/2022 CLINICAL DATA:  Pleural effusion EXAM: CT CHEST WITHOUT CONTRAST TECHNIQUE: Multidetector CT imaging of the chest was performed following the  standard protocol without IV contrast. RADIATION DOSE REDUCTION: This exam was performed according to the departmental dose-optimization program which includes automated exposure control, adjustment of the mA and/or kV according to patient size and/or use of iterative reconstruction technique. COMPARISON:  Chest CT dated February 13, 2022 FINDINGS: Cardiovascular: Normal heart size. Trace pericardial effusion. Normal caliber thoracic aorta with no atherosclerotic disease. No coronary  artery calcifications. Mediastinum/Nodes: Esophagus and thyroid are unremarkable. Mildly enlarged mediastinal lymph nodes, likely reactive. Reference periaortic node measuring 1.0 cm on series 4, image 51, unchanged when compared with prior exam. Lungs/Pleura: Central airways are patent. Numerous bilateral solid pulmonary nodules, are stable or slightly decreased in size when compared with prior exam. Reference solid nodule of the right lung apex measuring 15 mm on series 8, image 31, unchanged. Reference Peri fissural solid nodule of the left upper lobe measuring 13 mm on series 8, image 48, previously 15 mm. New mild interlobular septal thickening of the lower lungs. Small right pleural effusion with chest tube in place, decreased in size when compared with prior CT. Loculated fluid noted along the right minor fissure. Small left pleural effusion, increased in size when compared with prior. Upper Abdomen: No acute abnormality. Musculoskeletal: No chest wall mass or suspicious bone lesions identified. IMPRESSION: 1. Bilateral solid pulmonary nodules are stable or slightly decreased in size when compared with most recent chest CT, likely due to septic pulmonary emboli given history of MRSA bacteremia. 2. Small right pleural effusion with chest tube in place, decreased in size when compared with prior CT. Small left pleural effusion, increased in size when compared with prior CT. 3. Mild pulmonary edema. Electronically Signed   By: Yetta Glassman M.D.   On: 02/18/2022 14:54   DG CHEST PORT 1 VIEW  Result Date: 02/18/2022 CLINICAL DATA:  Pleural effusion EXAM: PORTABLE CHEST 1 VIEW COMPARISON:  February 17, 2022 FINDINGS: A right chest tube remains in place. Infiltrate in the right base is stable. Pleuroparenchymal thickening in the right apex is likely loculated pleural fluid given the February 13, 2022 chest x-ray. This finding is stable. Mild interstitial opacity elsewhere in the lungs is stable. The cardiomediastinal silhouette is unchanged. Continued left effusion with underlying opacity, similar in the interval. No pneumothorax. IMPRESSION: 1. The right chest tube is stable.  No pneumothorax. 2. Probable loculated fluid in the superior right pleural space, stable. Stable left effusion with underlying opacity. 3. Stable infiltrate in the right base. 4. Stable interstitial opacities in the lungs. Electronically Signed   By: Dorise Bullion III M.D.   On: 02/18/2022 08:20   DG CHEST PORT 1 VIEW  Result Date: 02/17/2022 CLINICAL DATA:  Pleural effusion. EXAM: PORTABLE CHEST 1 VIEW COMPARISON:  February 16, 2022. FINDINGS: Stable cardiomediastinal silhouette. Stable position of right-sided chest tube is noted. No definite pneumothorax is noted. Stable bilateral pulmonary edema is noted with probable left pleural effusion. Bony thorax is unremarkable. IMPRESSION: Stable position of right-sided chest tube is noted. No definite pneumothorax is noted. There is again noted a kink involving the chest tube. Stable probable bilateral pulmonary edema with left pleural effusion. Electronically Signed   By: Marijo Conception M.D.   On: 02/17/2022 09:18        Scheduled Meds:  bictegravir-emtricitabine-tenofovir AF  1 tablet Oral Daily   Chlorhexidine Gluconate Cloth  6 each Topical Daily   enoxaparin (LOVENOX) injection  40 mg Subcutaneous Q24H   fluconazole  200 mg Oral Daily   guaiFENesin  600 mg Oral BID   ketorolac  15 mg Intravenous Q6H    lidocaine  2 patch Transdermal Q24H   polyethylene glycol  17 g Oral BID   senna-docusate  1 tablet Oral BID   sodium chloride flush  10 mL Intrapleural Q8H   sodium chloride flush  10 mL Intrapleural Q8H   sodium chloride flush  10 mL Intrapleural  Q8H   sodium chloride flush  3 mL Intravenous Q12H   sulfamethoxazole-trimethoprim  1 tablet Oral Once per day on Mon Wed Fri   Continuous Infusions:  vancomycin 1,250 mg (02/19/22 0112)     LOS: 12 days        Florencia Reasons, MD PhD FACP [facility]   If 7PM-7AM, please contact night-coverage [email or web address]  02/19/2022, 8:43 AM

## 2022-02-19 NOTE — Progress Notes (Addendum)
NAME:  Rick Lam, MRN:  [id number], DOB:  1991-01-31, LOS: 12 ADMISSION DATE:  02/07/2022, CONSULTATION DATE:  02/07/2022 REFERRING MD:  Roderic Palau, CHIEF COMPLAINT:  fever, chest discomfort, weakness   History of Present Illness:  Rick Lam is a 31 year old male with HIV/AIDS on biktarvy, chronic hepatitis C, latent syphillis s/p treatment who presented with painful left axillary nodules with erythema, found to have sepsis with MRSA bacteremia along with axillary abscess, cellulitis and septic pulmonary emboli.   TEE 8/18 negative for vegetations. ID is following and patient has been on vancomycin. PCCM consulted due to increasing respiratory distress and new right pleural effusion.  Pertinent Medical History:  HIV/AIDS [place] Late latent syphilis, treated Chronic HCV  Significant Hospital Events: Including procedures, antibiotic start and stop dates in addition to other pertinent events   02/06/22 transfer from [facility] to ED, started on vanc/cefepime 8/15 - CT Chest with innumerable pulmonary nodules bilaterally, several cavitated; asymmetrical skin thickening/SQ edema in L axilla c/f cellulitis. PCCM consulted. 8/16 - TTE with EF 65-70%, LE dopplers negative for DVT. MRI L humerus with severe upper arm cellulitis, two small 1.5cm abscesses. ID consulted 8/17 - Ortho consult for cellulitis, conservative management with IV abx. BCx positive for MRSA, vanc-sensitive. 8/18 - TEE with Cards, r/o vegetation. Persistent pain. Cellulitis/erythema improving. 8/20 - chest tube placed  8/21 CT of the chest 8/22 First dose of TPA/DNAse with 822m put of of chest tube 8/23 Second dose of TPA/DNAse with 1.8 L out  8/24 Third dose of TPA/DNAse  8/26 CT chest with bilateral solid pulmonary nodules are stable or slightly decreased in size when compared with most recent chest CT, likely due to septic pulmonary emboli given history of MRSA bacteremia. Small right pleural effusion with chest tube in  place, decreased in size when compared with prior CT. Small left pleural effusion, increased in size when compared with prior CT. Mild pulmonary edema. 8/27 Little to no output from chest tube over the last 24hrs, plan to remove chest tube today.   Interim History / Subjective:  States he feels well with no acute complaints, is very excited that chest tube will be removed today   Objective:  Blood pressure 112/64, pulse 86, temperature 98 F (36.7 C), temperature source Oral, resp. rate 16, height 6' (1.829 m), weight 74.8 kg, SpO2 95 %.        Intake/Output Summary (Last 24 hours) at 02/19/2022 0714 Last data filed at 02/19/2022 0432 Gross per 24 hour  Intake 994.93 ml  Output 1750 ml  Net -755.07 ml    Filed Weights   02/16/22 0500 02/17/22 0500 02/18/22 1100  Weight: 75.2 kg 78 kg 74.8 kg   Physical Examination: General: Well appearing adult male lying in bed in NAD  HEENT: [place]/AT, MM pink/moist, PERRL,  Neuro: Alert and oriented x3, non-focal  CV: s1s2 regular rate and rhythm, no murmur, rubs, or gallops,  PULM:  Clear to ascultation, no increased work of breathing, on RA GI: soft, bowel sounds active in all 4 quadrants, non-tender, non-distended, tolerating oral diet Extremities: warm/dry, no edema  Skin: no rashes or lesions  Assessment & Plan:   Sepsis due to MRSA Bacteremia, septic pulmonary emboli and left arm abscess/cellulitis Acute Hypoxemic Respiratory Failure Right Pleural Effusion HIV/AIDS -S/p 3 doses of pleural lytic therapy  Plan: Remove chest tube  Repeat CXR in am  Continue IV vancomycin   Signature:  Whitney D. HKenton Kingfisher NP-C  Pulmonary & Critical Care  Personal contact information can be found on Amion  02/19/2022, 7:16 AM  ------------------------  Attending note: I have seen and examined the patient. History, labs and imaging reviewed. 31 year old with sepsis due to MRSA bacteremia, septic emboli, parapneumonic effusion status post  chest tube and 3 doses of lytic therapy Pleural effusion is improved on CT with minimal output from chest tube Will remove chest tube and repeat chest x-ray tomorrow.  Marshell Garfinkel MD [facility] 02/19/2022, 6:33 PM

## 2022-02-19 NOTE — Progress Notes (Signed)
Pharmacy Antibiotic Note  Rick Lam is a 30 y.o. male admitted on 02/07/2022 with MRSA bacteremia.  Pharmacy has been consulted for vancomycin dosing.  Vancomycin peak 29 and vancomycin trough 18. Calculated AUC ~680 supratherapeutic. Scr 0.63 stable. Afebrile with wbc wnl. Will adjust vancomycin regimen to target AUC 400-550. Vancomycin 1250 mg q12h estimated AUC ~453.    Plan: Decrease vancomycin '1250mg'$  IV q12h Monitor renal fxn, cx, and WBC daily, vanc levels PRN  Height: 6' (182.9 cm) Weight: 74.8 kg (165 lb) IBW/kg (Calculated) : 77.6  Temp (24hrs), Avg:98.4 F (36.9 C), Min:98 F (36.7 C), Max:98.8 F (37.1 C)  Recent Labs  Lab 02/15/22 0623 02/16/22 0600 02/17/22 0606 02/17/22 0940 02/18/22 0639 02/19/22 0528 02/19/22 0941  WBC 10.7* 11.4* 8.9  --  6.1 5.7  --   CREATININE 0.73 0.69 0.64  --  0.68 0.63  --   VANCOTROUGH  --   --   --  34*  --   --  18  VANCOPEAK  --   --  22*  --   --  29*  --      Estimated Creatinine Clearance: 139 mL/min (by C-G formula based on SCr of 0.63 mg/dL).    Allergies  Allergen Reactions   Banana     Other reaction(s): hives    Antimicrobials this admission: cefepime 8/15 >> 8/16 Bactrim 8/16 (for PJP ppx) >> metronidazole 8/15 '500mg'$  x 1 vancomycin 8/15 >> Fluconazole PO 8/21 >> (9/2)   Dose adjustments this admission: Vancomycin '1250mg'$  q8h >> 1250 mg q12h  Microbiology results: 8/15 BCID: MRSA 8/15 Ucx: NG 8/17 Bcx: NG 8/19 Bcx: NG 8/20 pleural fl cx: NG 8/20 pleural fl fungus cx: NG x3   Gena Fray, PharmD PGY1 Pharmacy Resident   02/19/2022 11:37 AM

## 2022-02-19 NOTE — Progress Notes (Signed)
Chest tube removed with help of Jae Dire, RN.  No complications.  No damage to catheter.

## 2022-02-20 ENCOUNTER — Inpatient Hospital Stay (HOSPITAL_COMMUNITY): Payer: Self-pay

## 2022-02-20 ENCOUNTER — Other Ambulatory Visit (HOSPITAL_COMMUNITY): Payer: Self-pay

## 2022-02-20 ENCOUNTER — Encounter (HOSPITAL_COMMUNITY)
Admission: EM | Disposition: A | Discharge status: Home / Self Care | Payer: Self-pay | Source: Home / Self Care | Attending: Internal Medicine

## 2022-02-20 HISTORY — PX: THORACENTESIS: SHX235

## 2022-02-20 LAB — BASIC METABOLIC PANEL
Anion gap: 9 (ref 5–15)
BUN: 6 mg/dL (ref 6–20)
CO2: 24 mmol/L (ref 22–32)
Calcium: 8.1 mg/dL — ABNORMAL LOW (ref 8.9–10.3)
Chloride: 103 mmol/L (ref 98–111)
Creatinine, Ser: 0.63 mg/dL (ref 0.61–1.24)
GFR, Estimated: >60 mL/min (ref >60)
Glucose, Bld: 85 mg/dL (ref 70–99)
Potassium: 4 mmol/L (ref 3.5–5.1)
Sodium: 136 mmol/L (ref 135–145)

## 2022-02-20 LAB — LACTATE DEHYDROGENASE, PLEURAL OR PERITONEAL FLUID: LD, Fluid: 337 U/L — ABNORMAL HIGH (ref 3–23)

## 2022-02-20 LAB — ALBUMIN: Albumin: 2.1 g/dL — ABNORMAL LOW (ref 3.5–5.0)

## 2022-02-20 LAB — CBC
HCT: 31.9 % — ABNORMAL LOW (ref 39.0–52.0)
Hemoglobin: 10.4 g/dL — ABNORMAL LOW (ref 13.0–17.0)
MCH: 26.7 pg (ref 26.0–34.0)
MCHC: 32.6 g/dL (ref 30.0–36.0)
MCV: 81.8 fL (ref 80.0–100.0)
Platelets: 819 10*3/uL — ABNORMAL HIGH (ref 150–400)
RBC: 3.9 MIL/uL — ABNORMAL LOW (ref 4.22–5.81)
RDW: 16 % — ABNORMAL HIGH (ref 11.5–15.5)
WBC: 6.1 10*3/uL (ref 4.0–10.5)
nRBC: 0 % (ref 0.0–0.2)

## 2022-02-20 LAB — BODY FLUID CELL COUNT WITH DIFFERENTIAL
Eos, Fluid: 2 %
Lymphs, Fluid: 87 %
Monocyte-Macrophage-Serous Fluid: 6 % — ABNORMAL LOW (ref 50–90)
Neutrophil Count, Fluid: 3 % (ref 0–25)
Total Nucleated Cell Count, Fluid: 1775 cu mm — ABNORMAL HIGH (ref 0–1000)

## 2022-02-20 LAB — PROTEIN, PLEURAL OR PERITONEAL FLUID: Total protein, fluid: 4.1 g/dL

## 2022-02-20 LAB — GLUCOSE, PLEURAL OR PERITONEAL FLUID: Glucose, Fluid: 102 mg/dL

## 2022-02-20 LAB — ALBUMIN, PLEURAL OR PERITONEAL FLUID: Albumin, Fluid: <1.5 g/dL

## 2022-02-20 LAB — LACTATE DEHYDROGENASE: LDH: 144 U/L (ref 98–192)

## 2022-02-20 LAB — PROTEIN, TOTAL: Total Protein: 7.6 g/dL (ref 6.5–8.1)

## 2022-02-20 SURGERY — THORACENTESIS
Anesthesia: LOCAL

## 2022-02-20 MED ORDER — MUPIROCIN 2 % EX OINT
1.0000 | TOPICAL_OINTMENT | Freq: Two times a day (BID) | CUTANEOUS | Status: DC
  Administered 2022-02-20 – 2022-02-21 (×3): 1 via NASAL
  Filled 2022-02-20: qty 22

## 2022-02-20 MED ORDER — CHLORHEXIDINE GLUCONATE CLOTH 2 % EX PADS
6.0000 | MEDICATED_PAD | Freq: Every day | CUTANEOUS | Status: DC
  Administered 2022-02-20 – 2022-02-21 (×2): 6 via TOPICAL

## 2022-02-20 NOTE — Progress Notes (Signed)
   NAME:  Rick Lam, MRN:  [id number], DOB:  Jul 07, 1990, LOS: 6 ADMISSION DATE:  02/07/2022, CONSULTATION DATE:  02/07/2022 REFERRING MD:  Roderic Palau, CHIEF COMPLAINT:  fever, chest discomfort, weakness   History of Present Illness:  Rick Lam is a 31 year old male with HIV/AIDS on biktarvy, chronic hepatitis C, latent syphillis s/p treatment who presented with painful left axillary nodules with erythema, found to have sepsis with MRSA bacteremia along with axillary abscess, cellulitis and septic pulmonary emboli.   TEE 8/18 negative for vegetations. ID is following and patient has been on vancomycin. PCCM consulted due to increasing respiratory distress and new right pleural effusion.  Pertinent Medical History:  HIV/AIDS [place] Late latent syphilis, treated Chronic HCV  Significant Hospital Events: Including procedures, antibiotic start and stop dates in addition to other pertinent events   02/06/22 transfer from [facility] ED, started on vanc/cefepime 8/15 - CT Chest with innumerable pulmonary nodules bilaterally, several cavitated; asymmetrical skin thickening/SQ edema in L axilla c/f cellulitis. PCCM consulted. 8/16 - TTE with EF 65-70%, LE dopplers negative for DVT. MRI L humerus with severe upper arm cellulitis, two small 1.5cm abscesses. ID consulted 8/17 - Ortho consult for cellulitis, conservative management with IV abx. BCx positive for MRSA, vanc-sensitive. 8/18 - TEE with Cards, r/o vegetation. Persistent pain. Cellulitis/erythema improving. 8/20 - chest tube placed  8/21 CT of the chest 8/22 First dose of TPA/DNAse with 817m put of of chest tube 8/23 Second dose of TPA/DNAse with 1.8 L out  8/24 Third dose of TPA/DNAse  8/26 CT chest with bilateral solid pulmonary nodules are stable or slightly decreased in size when compared with most recent chest CT, likely due to septic pulmonary emboli given history of MRSA bacteremia. Small right pleural effusion with chest tube in  place, decreased in size when compared with prior CT. Small left pleural effusion, increased in size when compared with prior CT. Mild pulmonary edema. 8/27 Little to no output from chest tube over the last 24hrs, removed chest tube  Interim History / Subjective:   He feels better Wants to go home  Objective:  Blood pressure 114/72, pulse 92, temperature 98.4 F (36.9 C), temperature source Oral, resp. rate 15, height 6' (1.829 m), weight 75.2 kg, SpO2 96 %.        Intake/Output Summary (Last 24 hours) at 02/20/2022 0943 Last data filed at 02/19/2022 2113 Gross per 24 hour  Intake 240 ml  Output 500 ml  Net -260 ml   Filed Weights   02/17/22 0500 02/18/22 1100 02/20/22 0459  Weight: 78 kg 74.8 kg 75.2 kg   Physical Examination:  General:  Resting comfortably in bed HENT: NCAT OP clear PULM:  normal effort CV: warm well perfused GI: non distended MSK: normal bulk and tone Neuro: awake, alert, no distress, MAEW   Assessment & Plan:   Sepsis due to MRSA Bacteremia, septic pulmonary emboli and left arm abscess/cellulitis Acute Hypoxemic Respiratory Failure HIV/AIDS Right Pleural Effusion-S/p 3 doses of pleural lytic therapy, chest tube removed on 8/27 Left pleural effusion noted to be increasing in size on CT chest > worrisome for same process as right  Plan: Thoracentesis on 8/28 in endo suite, left side  BRoselie Awkward MD [place] [facility] Pager: [phone]: [phone] 7:00 pm call Elink  ([phone]

## 2022-02-20 NOTE — Progress Notes (Signed)
PROGRESS NOTE    Rick Lam  JGG:[id number] DOB: 10-21-1990 DOA: 02/07/2022 PCP: No primary care provider on file.   Brief Narrative: 31 year old with past medical history significant for HIV/AIDS, recent CD4 44, chronic hepatitis C, latent syphilis that has been treated previously.  Presented with painful left axillary nodules with surrounding erythema he was admitted with sepsis, found to have MRSA bacteremia secondary to axillary abscess and cellulitis.  He underwent TEE 8/18 which was negative for endocarditis.  ID is following.  Patient developed worsening respiratory distress 8/20.  He was found to have a large right-sided pleural effusion, empyema.  Chest tube was placed.  Transfer to ICU 8/22 secondary to worsening respiratory failure in the setting of pigtail kink.  Chest tube was removed 8/27.    Assessment & Plan:   Principal Problem:   Severe sepsis ([place]) Active Problems:   AIDS (acquired immune deficiency syndrome) (HCC)   Oral thrush   Hyponatremia   Acute respiratory failure with hypoxia (HCC)   Acute septic pulmonary embolism without acute cor pulmonale (HCC)   Cutaneous abscess of left upper extremity   Pleural effusion   Empyema (HCC)   1-Sepsis secondary to MRSA bacteremia,  bilateral likely pulmonary septic emboli, Left arm cellulitis and abscess, para-pneumonic effusion.  -Patient immunosuppressed, in the setting of HIV/AIDS. -Blood cultures from 8/15 positive for MRSA. -Surgery consulted for further evaluation of axillary abscess on 8/19. -Surgery recommend warm compress, IV antibiotics, no need for drainage.  -Repeated blood culture 8/17 no growth to date. Blood culture 8/19: No growth to date.  -MRI left upper extremity with a small abscess x2 evaluated by Ortho, did not recommend drainage at that time. -TEE 8/18 negative for endocarditis -Pulmonology recommended repeat CT chest at the end of treatment for MRSA if lesions persist consider outpatient  bronchoscopy. -Continue with IV vancomycin. -ID looking into options for antibiotics treatment options between Linezolid vs Dalbavancin.    Acute hypoxic respiratory failure: Bilateral pulmonary nodules, concern with septic emboli Right side para-pneumonic effusion:  Continue with oxygen supplementation.  Incentive spirometry/ Flutter Valve S/P pigtail catheter placement right  8/20. CCM following.  Received 3 doses of  lytics through catheter 8/24. Pleural fluid, no growth to date.  Left side effusion moderate on CT. Plan for thoracentesis today.    HIV/AIDS: Continue with Biktarvy and Bactrim ID following, appreciate assistance  Hyponatremia: Could be combination of dehydration and some component of SIADH Sodium improved with hydration.  AKI: Resolved Oral thrush; odynophagia; On diflucan, needs 2 weeks treatment.   Estimated body mass index is 22.48 kg/m as calculated from the following:   Height as of this encounter: 6' (1.829 m).   Weight as of this encounter: 75.2 kg.   DVT prophylaxis: Lovenox Code Status: Full code Family Communication: Care discussed with patient and mother at bedside. /8/23.  Disposition Plan:  Status is: Inpatient Remains inpatient appropriate because: Continue with management of MRSA bacteremia, axillary abscess, bilateral pulmonary septic emboli. Empyema/     Consultants:  ID Surgery   Procedures:  TEE 8/18 negative for endocarditis.   Antimicrobials:  Vancomycin   Subjective: Arm infection improved, he is able to moves his arm better.  He is feeling better, denies dyspnea. He is on Room air.   Objective: Vitals:   02/20/22 0000 02/20/22 0400 02/20/22 0459 02/20/22 1018  BP: (!) 104/56 114/72  105/62  Pulse: 92   98  Resp:  15  20  Temp: 98.4 F (36.9 C) 98.4  F (36.9 C)  98.5 F (36.9 C)  TempSrc: Oral Oral  Oral  SpO2: 96%   99%  Weight:   75.2 kg   Height:        Intake/Output Summary (Last 24 hours) at 02/20/2022  1313 Last data filed at 02/19/2022 2113 Gross per 24 hour  Intake --  Output 500 ml  Net -500 ml    Filed Weights   02/17/22 0500 02/18/22 1100 02/20/22 0459  Weight: 78 kg 74.8 kg 75.2 kg    Examination:  General exam: NAD Respiratory system: BL air movement Cardiovascular system: S 1. S 2 RRR Gastrointestinal system: BS present, soft, nt Central nervous system:alert. Follows command Extremities: No edema   Data Reviewed: I have personally reviewed following labs and imaging studies  CBC: Recent Labs  Lab 02/16/22 0600 02/17/22 0606 02/18/22 0639 02/19/22 0528 02/20/22 0540  WBC 11.4* 8.9 6.1 5.7 6.1  HGB 10.5* 9.2* 8.7* 10.0* 10.4*  HCT 32.3* 28.3* 27.0* 30.2* 31.9*  MCV 83.7 83.0 83.6 83.0 81.8  PLT 440* 547* 630* 723* 819*    Basic Metabolic Panel: Recent Labs  Lab 02/14/22 0459 02/15/22 0623 02/16/22 0600 02/17/22 0606 02/18/22 0639 02/19/22 0528 02/20/22 0540  NA 132*   < > 131* 131* 135 134* 136  K 4.3   < > 4.4 4.1 4.6 4.3 4.0  CL 99   < > 97* 102 104 102 103  CO2 24   < > '25 27 26 25 24  '$ GLUCOSE 103*   < > 132* 94 91 89 85  BUN 10   < > '15 14 14 9 6  '$ CREATININE 0.78   < > 0.69 0.64 0.68 0.63 0.63  CALCIUM 7.8*   < > 7.5* 7.2* 7.7* 8.0* 8.1*  MG 1.8  --   --   --   --   --   --    < > = values in this interval not displayed.    GFR: Estimated Creatinine Clearance: 139.7 mL/min (by C-G formula based on SCr of 0.63 mg/dL). Liver Function Tests: No results for input(s): "AST", "ALT", "ALKPHOS", "BILITOT", "PROT", "ALBUMIN" in the last 168 hours.  No results for input(s): "LIPASE", "AMYLASE" in the last 168 hours. No results for input(s): "AMMONIA" in the last 168 hours. Coagulation Profile: No results for input(s): "INR", "PROTIME" in the last 168 hours.  Cardiac Enzymes: No results for input(s): "CKTOTAL", "CKMB", "CKMBINDEX", "TROPONINI" in the last 168 hours. BNP (last 3 results) No results for input(s): "PROBNP" in the last 8760  hours. HbA1C: No results for input(s): "HGBA1C" in the last 72 hours. CBG: No results for input(s): "GLUCAP" in the last 168 hours.  Lipid Profile: No results for input(s): "CHOL", "HDL", "LDLCALC", "TRIG", "CHOLHDL", "LDLDIRECT" in the last 72 hours. Thyroid Function Tests: No results for input(s): "TSH", "T4TOTAL", "FREET4", "T3FREE", "THYROIDAB" in the last 72 hours.  Anemia Panel: No results for input(s): "VITAMINB12", "FOLATE", "FERRITIN", "TIBC", "IRON", "RETICCTPCT" in the last 72 hours. Sepsis Labs: No results for input(s): "PROCALCITON", "LATICACIDVEN" in the last 168 hours.   Recent Results (from the past 240 hour(s))  Culture, blood (Routine X 2) w Reflex to ID Panel     Status: None   Collection Time: 02/11/22  3:49 AM   Specimen: BLOOD LEFT HAND  Result Value Ref Range Status   Specimen Description BLOOD LEFT HAND  Final   Special Requests   Final    BOTTLES DRAWN AEROBIC ONLY Blood Culture results may  not be optimal due to an inadequate volume of blood received in culture bottles   Culture   Final    NO GROWTH 5 DAYS Performed at [facility], [place] [address], [place], [place] [address]    Report Status 02/16/2022 FINAL  Final  Culture, blood (Routine X 2) w Reflex to ID Panel     Status: None   Collection Time: 02/11/22  3:57 AM   Specimen: BLOOD RIGHT HAND  Result Value Ref Range Status   Specimen Description BLOOD RIGHT HAND  Final   Special Requests   Final    BOTTLES DRAWN AEROBIC ONLY Blood Culture results may not be optimal due to an inadequate volume of blood received in culture bottles   Culture   Final    NO GROWTH 5 DAYS Performed at [facility], [place] [address], [place], [place] [address]    Report Status 02/16/2022 FINAL  Final  Body fluid culture w Gram Stain     Status: None   Collection Time: 02/12/22  4:46 PM   Specimen: Pleural Fluid  Result Value Ref Range Status   Specimen Description FLUID  Final   Special Requests  Immunocompromised  Final   Gram Stain NO WBC SEEN NO ORGANISMS SEEN   Final   Culture   Final    NO GROWTH 3 DAYS Performed at [facility] [address] [place], [place] [address]    Report Status 02/16/2022 FINAL  Final  Fungus Culture With Stain     Status: None (Preliminary result)   Collection Time: 02/12/22  4:46 PM   Specimen: Pleural Fluid  Result Value Ref Range Status   Fungus Stain Final report  Final    Comment: (NOTE) Performed At: [facility] [place], [place] [id number] Rush Farmer MD UT:[id number]    Fungus (Mycology) Culture PENDING  Incomplete   Fungal Source FLUID  Final    Comment: Performed at [facility], [place] [address], [place], [place] [address]  Fungus Culture Result     Status: None   Collection Time: 02/12/22  4:46 PM  Result Value Ref Range Status   Result 1 Comment  Final    Comment: (NOTE) KOH/Calcofluor preparation:  no fungus observed. Performed At: [facility] [place], [place] [id number] Rush Farmer MD BS:[id number]   MRSA Next Gen by PCR, Nasal     Status: Abnormal   Collection Time: 02/14/22  7:22 PM   Specimen: Nasal Mucosa; Nasal Swab  Result Value Ref Range Status   MRSA by PCR Next Gen DETECTED (A) NOT DETECTED Final    Comment: RESULT CALLED TO, READ BACK BY AND VERIFIED WITH: RN Eulogio Ditch [phone] '@[id number]'$  FH (NOTE) The GeneXpert MRSA Assay (FDA approved for NASAL specimens only), is one component of a comprehensive MRSA colonization surveillance program. It is not intended to diagnose MRSA infection nor to guide or monitor treatment for MRSA infections. Test performance is not FDA approved in patients less than 20 years old. Performed at [facility], [place] [address] [place], [place] [address]          Radiology Studies: DG CHEST PORT 1 VIEW  Result Date: 02/20/2022 CLINICAL DATA:  [id number]. Re-evaluate chest status after right chest tube removed. EXAM:  PORTABLE CHEST 1 VIEW COMPARISON:  Portable chest 02/18/2022. FINDINGS: 5:25 a.m. Lateral basal pigtail right chest tube has been removed. There is no visible pneumothorax. Small left-greater-than-right pleural  effusions, overlying left lower lobe dense consolidation and patchy right lower lung field opacities continue to be seen. Aeration is unchanged on the left and mildly improved on the right. The upper lung fields remaining clear. The cardiomediastinal silhouette is stable. IMPRESSION: 1. No visible pneumothorax following right chest tube removal. 2. Unchanged small left pleural effusion and overlying left lower lobe consolidation. 3. Minimal right pleural effusion with improving opacities in the right lower lung field. Electronically Signed   By: Telford Nab M.D.   On: 02/20/2022 07:38   CT CHEST WO CONTRAST  Result Date: 02/18/2022 CLINICAL DATA:  Pleural effusion EXAM: CT CHEST WITHOUT CONTRAST TECHNIQUE: Multidetector CT imaging of the chest was performed following the standard protocol without IV contrast. RADIATION DOSE REDUCTION: This exam was performed according to the departmental dose-optimization program which includes automated exposure control, adjustment of the mA and/or kV according to patient size and/or use of iterative reconstruction technique. COMPARISON:  Chest CT dated February 13, 2022 FINDINGS: Cardiovascular: Normal heart size. Trace pericardial effusion. Normal caliber thoracic aorta with no atherosclerotic disease. No coronary artery calcifications. Mediastinum/Nodes: Esophagus and thyroid are unremarkable. Mildly enlarged mediastinal lymph nodes, likely reactive. Reference periaortic node measuring 1.0 cm on series 4, image 51, unchanged when compared with prior exam. Lungs/Pleura: Central airways are patent. Numerous bilateral solid pulmonary nodules, are stable or slightly decreased in size when compared with prior exam. Reference solid nodule of the right lung apex measuring  15 mm on series 8, image 31, unchanged. Reference Peri fissural solid nodule of the left upper lobe measuring 13 mm on series 8, image 48, previously 15 mm. New mild interlobular septal thickening of the lower lungs. Small right pleural effusion with chest tube in place, decreased in size when compared with prior CT. Loculated fluid noted along the right minor fissure. Small left pleural effusion, increased in size when compared with prior. Upper Abdomen: No acute abnormality. Musculoskeletal: No chest wall mass or suspicious bone lesions identified. IMPRESSION: 1. Bilateral solid pulmonary nodules are stable or slightly decreased in size when compared with most recent chest CT, likely due to septic pulmonary emboli given history of MRSA bacteremia. 2. Small right pleural effusion with chest tube in place, decreased in size when compared with prior CT. Small left pleural effusion, increased in size when compared with prior CT. 3. Mild pulmonary edema. Electronically Signed   By: Yetta Glassman M.D.   On: 02/18/2022 14:54        Scheduled Meds:  bictegravir-emtricitabine-tenofovir AF  1 tablet Oral Daily   [START ON 02/21/2022] Chlorhexidine Gluconate Cloth  6 each Topical Q0600   enoxaparin (LOVENOX) injection  40 mg Subcutaneous Q24H   fluconazole  200 mg Oral Daily   guaiFENesin  600 mg Oral BID   lidocaine  2 patch Transdermal Q24H   mupirocin ointment  1 Application Nasal BID   polyethylene glycol  17 g Oral BID   senna-docusate  1 tablet Oral BID   sodium chloride flush  3 mL Intravenous Q12H   sulfamethoxazole-trimethoprim  1 tablet Oral Once per day on Mon Wed Fri   Continuous Infusions:  vancomycin 1,250 mg (02/20/22 1123)     LOS: 13 days    Time spent: 35 minutes    Ranson Belluomini A Reighn Kaplan, MD [facility]   If 7PM-7AM, please contact night-coverage [email or web address]  02/20/2022, 1:13 PM

## 2022-02-20 NOTE — Progress Notes (Signed)
[facility]  Date of Admission:  02/07/2022           Reason for visit: Follow up on MRSA infection  Current antibiotics: Vancomycin Biktarvy Fluconazole   ASSESSMENT:    31 y.o. male admitted with:  MRSA bacteremia: Admission blood cultures +8/15.  Repeat blood cultures 8/17 and 8/19 negative.  TEE 8/18 without vegetation. Pulmonary septic nodules: Complicated by parapneumonic effusion status post chest tube drainage on the right (removed 8/27).  Most recent CT 8/26 also noted small increased left pleural effusion.  PCCM planning for left-sided thoracentesis today. Left arm cellulitis/abscess: Evaluated by Ortho/general surgery with no indication for intervention at this time. Advanced HIV disease: Currently on Biktarvy and Bactrim for OI prophylaxis Thrush: On fluconazole until 9/4. History of hepatitis C   RECOMMENDATIONS:    Continue vancomycin for disseminated MRSA infection Continue Bactrim for OI prophylaxis Continue Biktarvy Continue fluconazole for oral thrush through 02/27/2022 Discussed with patient that standard of care would typically involve prolonged course of IV antibiotics.  However, based on prior history does not appear to be a PICC line candidate at this time.  Discussed alternative treatment options including weekly dalbavancin infusion versus prolonged course of linezolid with close lab monitoring.   Will look into these options given his uninsured status.  He is agreeable to close follow-up in the hopes of early discharge. I think can likely plan for discharge on Wednesday assuming outpatient follow-up can be arranged on our end and no new complications develop Will follow   Principal Problem:   Severe sepsis ([place]) Active Problems:   AIDS (acquired immune deficiency syndrome) ([place])   Oral thrush   Hyponatremia   Acute respiratory failure with hypoxia ([place])   Acute septic pulmonary embolism without acute cor pulmonale  (HCC)   Cutaneous abscess of left upper extremity   Pleural effusion   Empyema (HCC)    MEDICATIONS:    Scheduled Meds:  bictegravir-emtricitabine-tenofovir AF  1 tablet Oral Daily   enoxaparin (LOVENOX) injection  40 mg Subcutaneous Q24H   fluconazole  200 mg Oral Daily   guaiFENesin  600 mg Oral BID   lidocaine  2 patch Transdermal Q24H   polyethylene glycol  17 g Oral BID   senna-docusate  1 tablet Oral BID   sodium chloride flush  3 mL Intravenous Q12H   sulfamethoxazole-trimethoprim  1 tablet Oral Once per day on Mon Wed Fri   Continuous Infusions:  vancomycin 1,250 mg (02/19/22 2113)   PRN Meds:.acetaminophen **OR** acetaminophen, bisacodyl, hydrALAZINE, hydrOXYzine, ipratropium-albuterol, metoprolol tartrate, naLOXone (NARCAN)  injection, ondansetron **OR** ondansetron (ZOFRAN) IV, mouth rinse, oxyCODONE, traZODone  SUBJECTIVE:   24 hour events:  Status post chest tube removal yesterday Repeat chest x-ray this morning stable Afebrile Tmax 99.3 on room air No new cultures Remains on vancomycin, Biktarvy, fluconazole  Patient is very anxious to go home.  Reports being in the hospital makes him depressed and he has a 54-monthold puppy at home.  He otherwise is feeling improved.  Review of Systems  All other systems reviewed and are negative.     OBJECTIVE:   Blood pressure 114/72, pulse 92, temperature 98.4 F (36.9 C), temperature source Oral, resp. rate 15, height 6' (1.829 m), weight 75.2 kg, SpO2 96 %. Body mass index is 22.48 kg/m.  Physical Exam Constitutional:      General: He is not in acute distress.    Appearance: Normal appearance.  Pulmonary:  Effort: Pulmonary effort is normal. No respiratory distress.  Neurological:     General: No focal deficit present.     Mental Status: He is alert and oriented to person, place, and time.  Psychiatric:        Mood and Affect: Mood normal.        Behavior: Behavior normal.      Lab Results: Lab  Results  Component Value Date   WBC 6.1 02/20/2022   HGB 10.4 (L) 02/20/2022   HCT 31.9 (L) 02/20/2022   MCV 81.8 02/20/2022   PLT 819 (H) 02/20/2022    Lab Results  Component Value Date   NA 136 02/20/2022   K 4.0 02/20/2022   CO2 24 02/20/2022   GLUCOSE 85 02/20/2022   BUN 6 02/20/2022   CREATININE 0.63 02/20/2022   CALCIUM 8.1 (L) 02/20/2022   GFRNONAA >60 02/20/2022   GFRAA >60 01/16/2016    Lab Results  Component Value Date   ALT 20 02/10/2022   AST 18 02/10/2022   ALKPHOS 61 02/10/2022   BILITOT 0.6 02/10/2022    No results found for: "CRP"  No results found for: "ESRSEDRATE"   I have reviewed the micro and lab results in Epic.  Imaging: DG CHEST PORT 1 VIEW  Result Date: 02/20/2022 CLINICAL DATA:  [id number]. Re-evaluate chest status after right chest tube removed. EXAM: PORTABLE CHEST 1 VIEW COMPARISON:  Portable chest 02/18/2022. FINDINGS: 5:25 a.m. Lateral basal pigtail right chest tube has been removed. There is no visible pneumothorax. Small left-greater-than-right pleural effusions, overlying left lower lobe dense consolidation and patchy right lower lung field opacities continue to be seen. Aeration is unchanged on the left and mildly improved on the right. The upper lung fields remaining clear. The cardiomediastinal silhouette is stable. IMPRESSION: 1. No visible pneumothorax following right chest tube removal. 2. Unchanged small left pleural effusion and overlying left lower lobe consolidation. 3. Minimal right pleural effusion with improving opacities in the right lower lung field. Electronically Signed   By: Telford Nab M.D.   On: 02/20/2022 07:38   CT CHEST WO CONTRAST  Result Date: 02/18/2022 CLINICAL DATA:  Pleural effusion EXAM: CT CHEST WITHOUT CONTRAST TECHNIQUE: Multidetector CT imaging of the chest was performed following the standard protocol without IV contrast. RADIATION DOSE REDUCTION: This exam was performed according to the departmental  dose-optimization program which includes automated exposure control, adjustment of the mA and/or kV according to patient size and/or use of iterative reconstruction technique. COMPARISON:  Chest CT dated February 13, 2022 FINDINGS: Cardiovascular: Normal heart size. Trace pericardial effusion. Normal caliber thoracic aorta with no atherosclerotic disease. No coronary artery calcifications. Mediastinum/Nodes: Esophagus and thyroid are unremarkable. Mildly enlarged mediastinal lymph nodes, likely reactive. Reference periaortic node measuring 1.0 cm on series 4, image 51, unchanged when compared with prior exam. Lungs/Pleura: Central airways are patent. Numerous bilateral solid pulmonary nodules, are stable or slightly decreased in size when compared with prior exam. Reference solid nodule of the right lung apex measuring 15 mm on series 8, image 31, unchanged. Reference Peri fissural solid nodule of the left upper lobe measuring 13 mm on series 8, image 48, previously 15 mm. New mild interlobular septal thickening of the lower lungs. Small right pleural effusion with chest tube in place, decreased in size when compared with prior CT. Loculated fluid noted along the right minor fissure. Small left pleural effusion, increased in size when compared with prior. Upper Abdomen: No acute abnormality. Musculoskeletal: No chest wall mass or  suspicious bone lesions identified. IMPRESSION: 1. Bilateral solid pulmonary nodules are stable or slightly decreased in size when compared with most recent chest CT, likely due to septic pulmonary emboli given history of MRSA bacteremia. 2. Small right pleural effusion with chest tube in place, decreased in size when compared with prior CT. Small left pleural effusion, increased in size when compared with prior CT. 3. Mild pulmonary edema. Electronically Signed   By: Yetta Glassman M.D.   On: 02/18/2022 14:54     Imaging independently reviewed in Epic.    [facility] [phone] pager 02/20/2022, 8:09 AM

## 2022-02-20 NOTE — H&P (Signed)
[facility]  CC: dyspnea HPI: 31 y/o male with a complicated past medical history who has MRSA bacteremia, recent complicated parapneumonic effusion on the right treated with pulmozyme and dnase, now with an increasing left sided effusion.  Feels OK.  Here for thora.  Past Medical History:  Diagnosis Date   AIDS (acquired immune deficiency syndrome) ([place]) 06/01/2021   dx'ed 2010. Doesn't recall cd4 nadir. Sexual transmission. 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 No fever, chill, nightsweat. Never been on bactrim   Syphilis 06/01/2021   Remember getting 1 IM shot 04/2021; 02/2021 rpr 128. Doesn't recall rash before that. No concerning sx now via ROS as of 06/01/2021     Thrush 06/01/2021   Has had what he thinks is thrush for 3 months prior to 05/2021 visit. Lots of problem with pain when swallowing and also white plaque on tongue and throat  Also has hypertrophic nodule along gum lines  Started getting medication with spit and squish x3 separate times no effect. Received them at emergency room     Family History  Problem Relation Age of Onset   Alcoholism Father    Diabetes Other    Stroke Other    Hypertension Other      Social History   Socioeconomic History   Marital status: Single    Spouse name: Not on file   Number of children: Not on file   Years of education: Not on file   Highest education level: Not on  file  Occupational History   Not on file  Tobacco Use   Smoking status: Never   Smokeless tobacco: Never  Substance and Sexual Activity   Alcohol use: Not Currently    Comment: Occ   Drug use: No   Sexual activity: Not Currently    Comment: declined condoms  Other Topics Concern   Not on file  Social History Narrative   Not on file   Social Determinants of Health   Financial Resource Strain: Not on file  Food Insecurity: Not on file  Transportation Needs: Not on file  Physical Activity: Not on file  Stress: Not on file  Social Connections: Not on file  Intimate Partner Violence: Not on file     Allergies  Allergen Reactions   Banana     Other reaction(s): hives     '@encmedstart'$ @  Vitals:   02/20/22 1344 02/20/22 1347 02/20/22 1352 02/20/22 1354  BP: 105/64 1'09/67 99/69 98/67 '$  Pulse: (!) 112 (!) 111 (!) 108 (!) 109  Resp: (!) 26 (!) 24 (!) 33 (!) 21  Temp:      TempSrc:      SpO2: 96% (!) 72% (!) 78% 96%  Weight:      Height:       General:  Resting comfortably in bed HENT: NCAT OP clear PULM: CTA B, normal  effort CV: RRR, no mgr GI: BS+, soft, nontender MSK: normal bulk and tone Neuro: awake, alert, no distress, MAEW  CBC    Component Value Date/Time   WBC 6.1 02/20/2022 0540   RBC 3.90 (L) 02/20/2022 0540   HGB 10.4 (L) 02/20/2022 0540   HCT 31.9 (L) 02/20/2022 0540   PLT 819 (H) 02/20/2022 0540   MCV 81.8 02/20/2022 0540   MCH 26.7 02/20/2022 0540   MCHC 32.6 02/20/2022 0540   RDW 16.0 (H) 02/20/2022 0540   LYMPHSABS 1.7 02/11/2022 0341   MONOABS 1.3 (H) 02/11/2022 0341   EOSABS 0.1 02/11/2022 0341   BASOSABS 0.0 02/11/2022 0341   Impression MRSA bacteremia Complicated R pleural effusion Left pleural effusion  Plan: Thoracentesis of left, patient informed of risks and benefits, gave consent  Roselie Awkward, MD [place] Pager: [phone] Cell: [phone] After 7:00 pm call Elink  [phone]

## 2022-02-20 NOTE — Op Note (Signed)
Thoracentesis  Procedure Note  MATTY VANROEKEL  [id number]  May 12, 1991  Date:02/20/22  Time:2:11 PM   Provider Performing:Brent Shloime Keilman   Procedure: Thoracentesis with imaging guidance (68127)  Indication(s) Pleural Effusion  Consent Risks of the procedure as well as the alternatives and risks of each were explained to the patient and/or caregiver.  Consent for the procedure was obtained and is signed in the bedside chart  Anesthesia Topical only with 1% lidocaine    Time Out Verified patient identification, verified procedure, site/side was marked, verified correct patient position, special equipment/implants available, medications/allergies/relevant history reviewed, required imaging and test results available.   Sterile Technique Maximal sterile technique including full sterile barrier drape, hand hygiene, sterile gown, sterile gloves, mask, hair covering, sterile ultrasound probe cover (if used).  Procedure Description Ultrasound was used to identify appropriate pleural anatomy for placement and overlying skin marked.  Area of drainage cleaned and draped in sterile fashion. Lidocaine was used to anesthetize the skin and subcutaneous tissue.  650 cc's of bloody appearing fluid was drained from the left pleural space. Catheter then removed and bandaid applied to site.   Complications/Tolerance None; patient tolerated the procedure well. Chest X-ray is ordered to confirm no post-procedural complication.   EBL Minimal   Specimen(s) Pleural fluid cell count, diff, cytology, culture, ldh, protein, albumin  Roselie Awkward, MD [facility] Pager: [phone] Cell: [phone] After 7:00 pm call Elink  [phone]

## 2022-02-20 NOTE — Progress Notes (Signed)
This nurse entered room to find pt upset and anxious d/t having been in the hospital for 2 weeks. Pt requested PRN anxiety medication. While in room, pt's sister called nurse and shared how he called upset and requested that primary team be asked about possible use of anti-depressants as well. This message was relayed to the primary MD. MD plans to speak with pt at bedside.  Raelyn Number, RN

## 2022-02-21 ENCOUNTER — Other Ambulatory Visit (HOSPITAL_COMMUNITY): Payer: Self-pay

## 2022-02-21 LAB — CREATININE, SERUM
Creatinine, Ser: 0.79 mg/dL (ref 0.61–1.24)
GFR, Estimated: >60 mL/min (ref >60)

## 2022-02-21 LAB — MISC LABCORP TEST (SEND OUT): Labcorp test code: 9985

## 2022-02-21 MED ORDER — POLYETHYLENE GLYCOL 3350 17 GM/SCOOP PO POWD
17.0000 g | Freq: Two times a day (BID) | ORAL | 0 refills | Status: AC
  Filled 2022-02-21: qty 238, 7d supply, fill #0

## 2022-02-21 MED ORDER — ORITAVANCIN DIPHOSPHATE 400 MG IV SOLR
1200.0000 mg | Freq: Once | INTRAVENOUS | Status: AC
  Administered 2022-02-21: 1200 mg via INTRAVENOUS
  Filled 2022-02-21: qty 120

## 2022-02-21 MED ORDER — TRAZODONE HCL 100 MG PO TABS
100.0000 mg | ORAL_TABLET | Freq: Every evening | ORAL | 1 refills | Status: AC | PRN
  Filled 2022-02-21 – 2022-07-04 (×2): qty 30, 30d supply, fill #0

## 2022-02-21 MED ORDER — GUAIFENESIN ER 600 MG PO TB12
600.0000 mg | ORAL_TABLET | Freq: Two times a day (BID) | ORAL | 0 refills | Status: AC
  Filled 2022-02-21: qty 30, 15d supply, fill #0

## 2022-02-21 MED ORDER — SULFAMETHOXAZOLE-TRIMETHOPRIM 800-160 MG PO TABS
1.0000 | ORAL_TABLET | ORAL | 0 refills | Status: DC
  Filled 2022-02-21: qty 60, 140d supply, fill #0
  Filled 2022-07-04: qty 30, 70d supply, fill #0

## 2022-02-21 MED ORDER — FLUCONAZOLE 200 MG PO TABS
200.0000 mg | ORAL_TABLET | Freq: Every day | ORAL | 0 refills | Status: AC
  Filled 2022-02-21: qty 5, 5d supply, fill #0

## 2022-02-21 MED ORDER — ESCITALOPRAM OXALATE 5 MG PO TABS
5.0000 mg | ORAL_TABLET | Freq: Every day | ORAL | 1 refills | Status: AC
  Filled 2022-02-21: qty 30, 30d supply, fill #0

## 2022-02-21 MED ORDER — OXYCODONE HCL 5 MG PO TABS
5.0000 mg | ORAL_TABLET | Freq: Four times a day (QID) | ORAL | 0 refills | Status: AC | PRN
Start: 2022-02-21 — End: 2022-02-24
  Filled 2022-02-21: qty 10, 3d supply, fill #0

## 2022-02-21 MED ORDER — ESCITALOPRAM OXALATE 10 MG PO TABS
5.0000 mg | ORAL_TABLET | Freq: Every day | ORAL | Status: DC
  Administered 2022-02-21: 5 mg via ORAL
  Filled 2022-02-21: qty 1

## 2022-02-21 NOTE — Progress Notes (Signed)
NAME:  Rick Lam, MRN:  [id number], DOB:  Nov 14, 1990, LOS: 36 ADMISSION DATE:  02/07/2022, CONSULTATION DATE:  02/07/2022 REFERRING MD:  Roderic Palau, CHIEF COMPLAINT:  fever, chest discomfort, weakness   History of Present Illness:  Rick Lam is a 31 year old male with HIV/AIDS on biktarvy, chronic hepatitis C, latent syphillis s/p treatment who presented with painful left axillary nodules with erythema, found to have sepsis with MRSA bacteremia along with axillary abscess, cellulitis and septic pulmonary emboli.   TEE 8/18 negative for vegetations. ID is following and patient has been on vancomycin. PCCM consulted due to increasing respiratory distress and new right pleural effusion.  Pertinent Medical History:  HIV/AIDS [place] Late latent syphilis, treated Chronic HCV  Significant Hospital Events: Including procedures, antibiotic start and stop dates in addition to other pertinent events   02/06/22 transfer from [facility] to ED, started on vanc/cefepime 8/15 - CT Chest with innumerable pulmonary nodules bilaterally, several cavitated; asymmetrical skin thickening/SQ edema in L axilla c/f cellulitis. PCCM consulted. 8/16 - TTE with EF 65-70%, LE dopplers negative for DVT. MRI L humerus with severe upper arm cellulitis, two small 1.5cm abscesses. ID consulted 8/17 - Ortho consult for cellulitis, conservative management with IV abx. BCx positive for MRSA, vanc-sensitive. 8/18 - TEE with Cards, r/o vegetation. Persistent pain. Cellulitis/erythema improving. 8/20 - chest tube placed  8/21 CT of the chest 8/22 First dose of TPA/DNAse with 877m put of of chest tube 8/23 Second dose of TPA/DNAse with 1.8 L out  8/24 Third dose of TPA/DNAse  8/26 CT chest with bilateral solid pulmonary nodules are stable or slightly decreased in size when compared with most recent chest CT, likely due to septic pulmonary emboli given history of MRSA bacteremia. Small right pleural effusion with chest tube in  place, decreased in size when compared with prior CT. Small left pleural effusion, increased in size when compared with prior CT. Mild pulmonary edema. 8/27 Little to no output from chest tube over the last 24hrs, removed chest tube 8/28 left thora> 650cc bloody fluid, lymph predominant  Interim History / Subjective:   Feels better Wants to leave  Objective:  Blood pressure 109/74, pulse 99, temperature 98.6 F (37 C), temperature source Oral, resp. rate 20, height 6' (1.829 m), weight 68.1 kg, SpO2 97 %.        Intake/Output Summary (Last 24 hours) at 02/21/2022 1155 Last data filed at 02/21/2022 [id number] per 24 hour  Intake 1227.22 ml  Output 775 ml  Net 452.22 ml   Filed Weights   02/18/22 1100 02/20/22 0459 02/21/22 0500  Weight: 74.8 kg 75.2 kg 68.1 kg   Physical Examination:  General:  Resting comfortably in bed HENT: NCAT OP clear PULM: CTA B, normal effort CV: RRR, no mgr GI: BS+, soft, nontender MSK: normal bulk and tone Neuro: awake, alert, no distress, MAEW    Assessment & Plan:   Sepsis due to MRSA Bacteremia, septic pulmonary emboli and left arm abscess/cellulitis Acute Hypoxemic Respiratory Failure HIV/AIDS Right Pleural Effusion-S/p 3 doses of pleural lytic therapy, chest tube removed on 8/27 Left pleural effusion > unclear etiology, lymphocytic predominant  Plan: F/u cytology and culture from pleural fluid as outpatient Added on fungal and AFB culture to pleural fluid today OK to d/c home from my perspective Get a CXR if chest pain or dyspnea No reason to f/u in pulmonary clinic unless pleural fluid recurs   BRoselie Awkward MD [facility] Pager: [phone]: [phone] 7:00  pm call Elink  [phone]

## 2022-02-21 NOTE — Progress Notes (Signed)
[facility]  Date of Admission:  02/07/2022           Reason for visit: Follow up on disseminated MRSA infection  Current antibiotics: Vancomycin Biktarvy Fluconazole    ASSESSMENT:    31 y.o. male admitted with:  MRSA bacteremia: Admission blood cultures were positive on 02/07/2022 with repeat blood cultures 8/17 and 8/19 negative.  He underwent TEE on 02/10/2022 that was without vegetation.  However, right-sided endocarditis highly suspected given #2. Pulmonary septic nodules: Complicated by parapneumonic effusion.  Status post chest tube drainage on the right with subsequent removal on 02/19/2022.  Most recent CT 02/18/2022 also noted a small increase left pleural effusion status postthoracentesis 02/20/2022 with 650 cc of fluid drained.  This was for site predominant and fungal/AFB cultures have been added. Left arm cellulitis/abscess: Evaluated by orthopedic and general surgery with no indication for intervention at this time.  This is clinically improved. Advanced HIV disease: Currently on Biktarvy as well as Bactrim for OI prophylaxis. Thrush: On empiric fluconazole course through 02/27/2022. History of hepatitis C.  RECOMMENDATIONS:    Will give a dose of oritavancin today and would be okay for discharge following that We have arranged for weekly dalbavancin infusions starting next Wednesday, 03/01/2022 x 5 more doses.  Discussed with patient the necessity of keeping these appointments and being consistent with follow-up for this alternative approach to treatment given his strong desire to go home.  We are planning for 5 more doses of dalbavancin as an outpatient which will effectively provide 6 weeks of therapy from yesterday's thoracentesis Continue Biktarvy Continue Bactrim for OI prophylaxis Continue fluconazole for oral thrush through 02/27/2022 Follow-up with Dr. Gale Journey on 03/09/22 at 10:30am Please call as needed   Principal Problem:   Severe  sepsis ([place]) Active Problems:   AIDS (acquired immune deficiency syndrome) ([place])   Oral thrush   Hyponatremia   Acute respiratory failure with hypoxia ([place])   Acute septic pulmonary embolism without acute cor pulmonale (HCC)   Cutaneous abscess of left upper extremity   Pleural effusion   Empyema ([facility])    MEDICATIONS:    Scheduled Meds:  bictegravir-emtricitabine-tenofovir AF  1 tablet Oral Daily   Chlorhexidine Gluconate Cloth  6 each Topical Q0600   enoxaparin (LOVENOX) injection  40 mg Subcutaneous Q24H   fluconazole  200 mg Oral Daily   guaiFENesin  600 mg Oral BID   lidocaine  2 patch Transdermal Q24H   mupirocin ointment  1 Application Nasal BID   polyethylene glycol  17 g Oral BID   senna-docusate  1 tablet Oral BID   sodium chloride flush  3 mL Intravenous Q12H   sulfamethoxazole-trimethoprim  1 tablet Oral Once per day on Mon Wed Fri   Continuous Infusions:  vancomycin 1,250 mg (02/21/22 1000)   PRN Meds:.acetaminophen **OR** acetaminophen, bisacodyl, hydrALAZINE, hydrOXYzine, ipratropium-albuterol, metoprolol tartrate, naLOXone (NARCAN)  injection, ondansetron **OR** ondansetron (ZOFRAN) IV, mouth rinse, oxyCODONE, traZODone  SUBJECTIVE:   33 hour events:  Patient underwent left-sided thoracentesis of pleural effusion.  Notable for 650 cc of bloody appearing fluid drained from the left side. Repeat chest x-ray showed interval decrease in the effusion Fluid cultures from this are no growth Afebrile, Tmax 99.1 Low oxygen levels charted yesterday afternoon, however, more or less has been stable on room air   Patient remains fixed in his desire to discharge home today if possible.  He states he is feeling great.  He also states that  he will be consistent with follow-up for weekly dalbavancin infusions as well as seeing Dr. Gale Journey in the clinic.  Review of Systems  All other systems reviewed and are negative.     OBJECTIVE:   Blood pressure 109/74, pulse 99,  temperature 98.6 F (37 C), temperature source Oral, resp. rate 20, height 6' (1.829 m), weight 68.1 kg, SpO2 97 %. Body mass index is 20.36 kg/m.  Physical Exam Constitutional:      General: He is not in acute distress.    Appearance: Normal appearance.  HENT:     Head: Normocephalic and atraumatic.  Pulmonary:     Effort: Pulmonary effort is normal. No respiratory distress.  Musculoskeletal:        General: Normal range of motion.  Skin:    General: Skin is warm and dry.  Neurological:     General: No focal deficit present.     Mental Status: He is alert and oriented to person, place, and time.  Psychiatric:        Mood and Affect: Mood normal.        Behavior: Behavior normal.      Lab Results: Lab Results  Component Value Date   WBC 6.1 02/20/2022   HGB 10.4 (L) 02/20/2022   HCT 31.9 (L) 02/20/2022   MCV 81.8 02/20/2022   PLT 819 (H) 02/20/2022    Lab Results  Component Value Date   NA 136 02/20/2022   K 4.0 02/20/2022   CO2 24 02/20/2022   GLUCOSE 85 02/20/2022   BUN 6 02/20/2022   CREATININE 0.79 02/21/2022   CALCIUM 8.1 (L) 02/20/2022   GFRNONAA >60 02/21/2022   GFRAA >60 01/16/2016    Lab Results  Component Value Date   ALT 20 02/10/2022   AST 18 02/10/2022   ALKPHOS 61 02/10/2022   BILITOT 0.6 02/10/2022    No results found for: "CRP"  No results found for: "ESRSEDRATE"   I have reviewed the micro and lab results in Epic.  Imaging: DG CHEST PORT 1 VIEW  Result Date: 02/20/2022 CLINICAL DATA:  Status post left thoracentesis EXAM: PORTABLE CHEST 1 VIEW COMPARISON:  Previous studies including the examination done earlier today FINDINGS: There is interval decrease in left pleural effusion. There is no pneumothorax. Minimal left pleural effusion is seen. Small right pleural effusion is seen. There are no signs of alveolar pulmonary edema or new focal infiltrates. IMPRESSION: There is interval decrease in left pleural effusion. There is no  pneumothorax. Electronically Signed   By: Elmer Picker M.D.   On: 02/20/2022 14:50   DG CHEST PORT 1 VIEW  Result Date: 02/20/2022 CLINICAL DATA:  [id number]. Re-evaluate chest status after right chest tube removed. EXAM: PORTABLE CHEST 1 VIEW COMPARISON:  Portable chest 02/18/2022. FINDINGS: 5:25 a.m. Lateral basal pigtail right chest tube has been removed. There is no visible pneumothorax. Small left-greater-than-right pleural effusions, overlying left lower lobe dense consolidation and patchy right lower lung field opacities continue to be seen. Aeration is unchanged on the left and mildly improved on the right. The upper lung fields remaining clear. The cardiomediastinal silhouette is stable. IMPRESSION: 1. No visible pneumothorax following right chest tube removal. 2. Unchanged small left pleural effusion and overlying left lower lobe consolidation. 3. Minimal right pleural effusion with improving opacities in the right lower lung field. Electronically Signed   By: Telford Nab M.D.   On: 02/20/2022 07:38     Imaging independently reviewed in Epic.    Jenna Luo  [facility] [phone] pager 02/21/2022, 10:27 AM

## 2022-02-21 NOTE — Plan of Care (Signed)

## 2022-02-21 NOTE — TOC Transition Note (Signed)
Transition of Care (TOC) - CM/SW Discharge Note Marvetta Gibbons RN, BSN Transitions of Care Unit 4E- RN Case Manager See Treatment Team for direct phone #    Patient Details  Name: Rick Lam MRN: [id number] Date of Birth: 12-10-1990  Transition of Care [facility]) CM/SW Contact:  Dawayne Patricia, RN Phone Number: 02/21/2022, 3:45 PM   Clinical Narrative:    Pt stable for transition home today. ID has arranged for weekly dalbavancin infusions starting next Wednesday, 03/01/2022 x 5 more doses. Pt will come to [facility] clinic for these infusions. ID has discussed this plan with pt and pt is agreeable. Pt to follow up with ID clinic as outpt .   Family to transport home, [facility] pharmacy to fill meds prior to discharge.   RNCM will sign off for now as intervention is no longer needed. Please re-consult  if new needs arise, or contact RNCM assigned to treatment team for further questions/concerns.      Final next level of care: Home/Self Care Barriers to Discharge: No Barriers Identified   Patient Goals and CMS Choice     Choice offered to / list presented to : NA  Discharge Placement                 Home      Discharge Plan and Services     Post Acute Care Choice: NA          DME Arranged: N/A DME Agency: NA       HH Arranged: NA HH Agency: NA        Social Determinants of Health (SDOH) Interventions     Readmission Risk Interventions    02/21/2022    3:45 PM  Readmission Risk Prevention Plan  Transportation Screening Complete  PCP or Specialist Appt within 5-7 Days Complete  Home Care Screening Complete  Medication Review (RN CM) Complete

## 2022-02-21 NOTE — Progress Notes (Signed)
Pt to be d/c from 4E to home. D/c instructions and medication education provided, states understanding. Tele and IV removed.   Raelyn Number, RN

## 2022-02-21 NOTE — Discharge Summary (Signed)
Physician Discharge Summary   Patient: Rick Lam MRN: [id number] DOB: 08/14/1990  Admit date:     02/07/2022  Discharge date: 02/21/22  Discharge Physician: Elmarie Shiley   PCP: No primary care provider on file.   Recommendations at discharge:    Follow up fungal and AFB culture from pleural fluid.  Follow up with ID for MRSA bacteremia follow up.  Plan for IV dalbavancin infusion for 5 more doses Follow-up appointment with Dr. Daryll Brod on 03/09/2022 at 10:30 AM Follow up with PCP for further care of depression.   Discharge Diagnoses: Principal Problem:   Severe sepsis ([place]) Active Problems:   AIDS (acquired immune deficiency syndrome) (HCC)   Oral thrush   Hyponatremia   Acute respiratory failure with hypoxia (HCC)   Acute septic pulmonary embolism without acute cor pulmonale (HCC)   Cutaneous abscess of left upper extremity   Pleural effusion   Empyema (HCC)  Resolved Problems:   * No resolved hospital problems. 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  Presented with painful left axillary nodules with surrounding erythema he was admitted with sepsis, found to have MRSA bacteremia secondary to axillary abscess and cellulitis.  He underwent TEE 8/18 which was negative for endocarditis.  ID is following.   Patient developed worsening respiratory distress 8/20.  He was found to have a large right-sided pleural effusion, empyema.  Chest tube was placed.  Transfer to ICU 8/22 secondary to worsening respiratory failure in the setting of pigtail kink.  Chest tube was removed 8/27.  He subsequently underwent left-sided thoracentesis 8/28.  Yielding 650 cc, with predominant lymphocytes.  AFB and fungal cultures has been ordered and currently pending  For his MRSA bacteremia patient will be discharged home on IV dalbavancin, infusion appointment will be  arranged by infectious disease team.  Patient is stable for discharge home today.   Assessment and Plan:   1-Sepsis secondary to MRSA bacteremia,  bilateral likely pulmonary septic emboli, Left arm cellulitis and abscess, para-pneumonic effusion.  -Patient immunosuppressed, in the setting of HIV/AIDS. -Blood cultures from 8/15 positive for MRSA. -Surgery consulted for further evaluation of axillary abscess on 8/19. -Surgery recommend warm compress, IV antibiotics, no need for drainage.  -Repeated blood culture 8/17 no growth to date. Blood culture 8/19: No growth to date.  -MRI left upper extremity with a small abscess x2 evaluated by Ortho, did not recommend drainage at that time. -TEE 8/18 negative for endocarditis -Pulmonology recommended repeat CT chest at the end of treatment for MRSA if lesions persist consider outpatient bronchoscopy. -Continue with IV vancomycin. -ID will arrange  Dalbavancin infusion out patient.      Acute hypoxic respiratory failure: Bilateral pulmonary nodules, concern with septic emboli Right side para-pneumonic effusion:  Continue with oxygen supplementation.  Incentive spirometry/ Flutter Valve S/P pigtail catheter placement right  8/20. CCM following.  Received 3 doses of  lytics through catheter 8/24. Pleural fluid, no growth to date.  Chest tube was removed 8/27. Left side effusion moderate on CT. underwent left-sided thoracentesis yielding 650 cc pleural fluid with predominant lymphocyte.  AFB and culture were added to pleural fluid. Has been cleared for discharge by pulmonologist     HIV/AIDS: Continue with Biktarvy and Bactrim ID following, appreciate assistance   Hyponatremia: Could be combination of dehydration and some component of SIADH Sodium improved with hydration.   AKI: Resolved Oral thrush; odynophagia;  On diflucan, needs 2 weeks treatment.  Depression.  He denies suicidal thoughts.  He willing to try low-dose Lexapro. He  feels depressed because he has been here in the hospital without being able to see his doggy. He was able to contract for safety       Consultants: ID, CMM Procedures performed: TEE, Thoracentesis.  Disposition: Home Diet recommendation:  Regular diet DISCHARGE MEDICATION: Allergies as of 02/21/2022       Reactions   Banana    Other reaction(s): hives        Medication List     STOP taking these medications    citalopram 20 MG tablet Commonly known as: CELEXA   FLINSTONES GUMMIES OMEGA-3 DHA PO   hydrOXYzine 25 MG tablet Commonly known as: ATARAX   ibuprofen 200 MG tablet Commonly known as: ADVIL   metoCLOPramide 10 MG tablet Commonly known as: REGLAN   nystatin 100000 UNIT/ML suspension Commonly known as: MYCOSTATIN   ondansetron 8 MG tablet Commonly known as: Zofran   zolpidem 10 MG tablet Commonly known as: AMBIEN       TAKE these medications    Biktarvy 50-200-25 MG Tabs tablet Generic drug: bictegravir-emtricitabine-tenofovir AF TAKE 1 TABLET BY MOUTH DAILY   escitalopram 5 MG tablet Commonly known as: LEXAPRO Take 1 tablet (5 mg total) by mouth daily.   fluconazole 200 MG tablet Commonly known as: DIFLUCAN Take 1 tablet (200 mg total) by mouth daily for 5 days. Start taking on: February 22, 2022   oxyCODONE 5 MG immediate release tablet Commonly known as: Oxy IR/ROXICODONE Take 1 tablet (5 mg total) by mouth every 6 (six) hours as needed for up to 3 days for moderate pain.   polyethylene glycol powder 17 GM/SCOOP powder Commonly known as: GLYCOLAX/MIRALAX Take 17 g by mouth 2 (two) times daily.   SM Mucus Relief 600 MG 12 hr tablet Generic drug: guaiFENesin Take 1 tablet (600 mg total) by mouth 2 (two) times daily.   sulfamethoxazole-trimethoprim 800-160 MG tablet Commonly known as: BACTRIM DS Take 1 tablet by mouth 3 (three) times a week. Start taking on: February 22, 2022 What changed: when to take this   traZODone 100 MG  tablet Commonly known as: DESYREL Take 1 tablet (100 mg total) by mouth at bedtime as needed for sleep. What changed:  when to take this reasons to take this        Follow-up Information     Vu, Rockey Situ, MD Follow up on 03/09/2022.   Specialty: Infectious Diseases Why: Appointment time- 10:30am Contact information: [address] [place] [address] [place] [address] [phone]                Discharge Exam: Danley Danker Weights   02/18/22 1100 02/20/22 0459 02/21/22 0500  Weight: 74.8 kg 75.2 kg 68.1 kg   General; NAD  Condition at discharge: stable  The results of significant diagnostics from this hospitalization (including imaging, microbiology, ancillary and laboratory) are listed below for reference.   Imaging Studies: DG CHEST PORT 1 VIEW  Result Date: 02/20/2022 CLINICAL DATA:  Status post left thoracentesis EXAM: PORTABLE CHEST 1 VIEW COMPARISON:  Previous studies including the examination done earlier today FINDINGS: There is interval decrease in left pleural effusion. There is no pneumothorax. Minimal left pleural effusion is seen. Small right pleural effusion is seen. There are no signs of alveolar pulmonary edema or new focal infiltrates. IMPRESSION: There is interval decrease in left pleural effusion. There is no pneumothorax. Electronically  Signed   By: Elmer Picker M.D.   On: 02/20/2022 14:50   DG CHEST PORT 1 VIEW  Result Date: 02/20/2022 CLINICAL DATA:  [id number]. Re-evaluate chest status after right chest tube removed. EXAM: PORTABLE CHEST 1 VIEW COMPARISON:  Portable chest 02/18/2022. FINDINGS: 5:25 a.m. Lateral basal pigtail right chest tube has been removed. There is no visible pneumothorax. Small left-greater-than-right pleural effusions, overlying left lower lobe dense consolidation and patchy right lower lung field opacities continue to be seen. Aeration is unchanged on the left and mildly improved on the right. The upper lung fields remaining clear.  The cardiomediastinal silhouette is stable. IMPRESSION: 1. No visible pneumothorax following right chest tube removal. 2. Unchanged small left pleural effusion and overlying left lower lobe consolidation. 3. Minimal right pleural effusion with improving opacities in the right lower lung field. Electronically Signed   By: Telford Nab M.D.   On: 02/20/2022 07:38   CT CHEST WO CONTRAST  Result Date: 02/18/2022 CLINICAL DATA:  Pleural effusion EXAM: CT CHEST WITHOUT CONTRAST TECHNIQUE: Multidetector CT imaging of the chest was performed following the standard protocol without IV contrast. RADIATION DOSE REDUCTION: This exam was performed according to the departmental dose-optimization program which includes automated exposure control, adjustment of the mA and/or kV according to patient size and/or use of iterative reconstruction technique. COMPARISON:  Chest CT dated February 13, 2022 FINDINGS: Cardiovascular: Normal heart size. Trace pericardial effusion. Normal caliber thoracic aorta with no atherosclerotic disease. No coronary artery calcifications. Mediastinum/Nodes: Esophagus and thyroid are unremarkable. Mildly enlarged mediastinal lymph nodes, likely reactive. Reference periaortic node measuring 1.0 cm on series 4, image 51, unchanged when compared with prior exam. Lungs/Pleura: Central airways are patent. Numerous bilateral solid pulmonary nodules, are stable or slightly decreased in size when compared with prior exam. Reference solid nodule of the right lung apex measuring 15 mm on series 8, image 31, unchanged. Reference Peri fissural solid nodule of the left upper lobe measuring 13 mm on series 8, image 48, previously 15 mm. New mild interlobular septal thickening of the lower lungs. Small right pleural effusion with chest tube in place, decreased in size when compared with prior CT. Loculated fluid noted along the right minor fissure. Small left pleural effusion, increased in size when compared with  prior. Upper Abdomen: No acute abnormality. Musculoskeletal: No chest wall mass or suspicious bone lesions identified. IMPRESSION: 1. Bilateral solid pulmonary nodules are stable or slightly decreased in size when compared with most recent chest CT, likely due to septic pulmonary emboli given history of MRSA bacteremia. 2. Small right pleural effusion with chest tube in place, decreased in size when compared with prior CT. Small left pleural effusion, increased in size when compared with prior CT. 3. Mild pulmonary edema. Electronically Signed   By: Yetta Glassman M.D.   On: 02/18/2022 14:54   DG CHEST PORT 1 VIEW  Result Date: 02/18/2022 CLINICAL DATA:  Pleural effusion EXAM: PORTABLE CHEST 1 VIEW COMPARISON:  February 17, 2022 FINDINGS: A right chest tube remains in place. Infiltrate in the right base is stable. Pleuroparenchymal thickening in the right apex is likely loculated pleural fluid given the February 13, 2022 chest x-ray. This finding is stable. Mild interstitial opacity elsewhere in the lungs is stable. The cardiomediastinal silhouette is unchanged. Continued left effusion with underlying opacity, similar in the interval. No pneumothorax. IMPRESSION: 1. The right chest tube is stable.  No pneumothorax. 2. Probable loculated fluid in the superior right pleural space, stable. Stable left effusion  with underlying opacity. 3. Stable infiltrate in the right base. 4. Stable interstitial opacities in the lungs. Electronically Signed   By: Dorise Bullion III M.D.   On: 02/18/2022 08:20   DG CHEST PORT 1 VIEW  Result Date: 02/17/2022 CLINICAL DATA:  Pleural effusion. EXAM: PORTABLE CHEST 1 VIEW COMPARISON:  February 16, 2022. FINDINGS: Stable cardiomediastinal silhouette. Stable position of right-sided chest tube is noted. No definite pneumothorax is noted. Stable bilateral pulmonary edema is noted with probable left pleural effusion. Bony thorax is unremarkable. IMPRESSION: Stable position of right-sided  chest tube is noted. No definite pneumothorax is noted. There is again noted a kink involving the chest tube. Stable probable bilateral pulmonary edema with left pleural effusion. Electronically Signed   By: Marijo Conception M.D.   On: 02/17/2022 09:18   DG CHEST PORT 1 VIEW  Result Date: 02/16/2022 CLINICAL DATA:  [id number]; right chest tube, pleural effusion, follow-up study EXAM: PORTABLE CHEST 1 VIEW COMPARISON:  Chest x-ray from yesterday FINDINGS: Again seen is the pigtail right chest tube at the right lower lung. The chest tube is kinked at the insertion site. Cardiomediastinal silhouette is enlarged and stable. There has been interval decrease in the right pleural effusion. There has been interval improvement of the interstitial edema. There are patchy opacity seen in the bilateral mid-lower lungs greater at the retrocardiac region and have mildly improved in the interim as well. The visualized skeletal structures are unremarkable. IMPRESSION: 1. There is a pigtail right chest tube at lower lung. There is a kink at the catheter insertion site. There has been interval decrease in the right pleural effusion. 2. There has been interval improvement of the interstitial edema as well as the patchy bilateral opacities likely on the basis of atelectasis-infiltrations. Electronically Signed   By: Frazier Richards M.D.   On: 02/16/2022 07:59   DG CHEST PORT 1 VIEW  Result Date: 02/15/2022 CLINICAL DATA:  Sepsis.  Chest tube present. EXAM: PORTABLE CHEST 1 VIEW COMPARISON:  One-view chest x-ray 02/14/2022 FINDINGS: The heart is enlarged. Diffuse interstitial edema has increased. Right pleural effusion is increased slightly. No pneumothorax is present. A small left pleural effusion is stable. Lung volumes have decreased. IMPRESSION: 1. Cardiomegaly with increasing interstitial edema and right pleural effusion. 2. Stable small left pleural effusion. Electronically Signed   By: San Morelle M.D.   On: 02/15/2022  08:11   DG CHEST PORT 1 VIEW  Result Date: 02/14/2022 CLINICAL DATA:  Respiratory distress EXAM: PORTABLE CHEST 1 VIEW COMPARISON:  Chest x-ray dated February 14, 2022 FINDINGS: Cardiac and mediastinal contours are unchanged. Small right pleural effusion with chest tube in place. Stable trace left pleural effusion. Unchanged bilateral heterogeneous opacities. No evidence of pneumothorax. IMPRESSION: 1. Stable small right pleural effusion with chest tube in place. 2. Unchanged bilateral heterogeneous pulmonary opacities. Electronically Signed   By: Yetta Glassman M.D.   On: 02/14/2022 18:14   DG CHEST PORT 1 VIEW  Result Date: 02/14/2022 CLINICAL DATA:  Shortness of breath, cough EXAM: PORTABLE CHEST 1 VIEW COMPARISON:  Previous studies including the examination of 02/12/2022 FINDINGS: Cardiac size is within normal limits. Central pulmonary vessels are less prominent. There is improvement in the aeration in both lungs. Residual increased markings are seen in right parahilar region and both lower lung fields. Small to moderate bilateral pleural effusions are seen, more so on the right side. Right chest tube is noted. There is no pneumothorax. IMPRESSION: There is slight improvement in the aeration  of both lungs suggesting resolving pneumonia or decrease in pulmonary edema. No new focal infiltrates are seen. Small to moderate bilateral pleural effusions, more so on the right side with no significant change. Electronically Signed   By: Elmer Picker M.D.   On: 02/14/2022 13:22   CT CHEST WO CONTRAST  Result Date: 02/13/2022 CLINICAL DATA:  Pneumonia, complications suspected. History of chronic hepatitis C and AIDS. EXAM: CT CHEST WITHOUT CONTRAST TECHNIQUE: Multidetector CT imaging of the chest was performed following the standard protocol without IV contrast. RADIATION DOSE REDUCTION: This exam was performed according to the departmental dose-optimization program which includes automated exposure  control, adjustment of the mA and/or kV according to patient size and/or use of iterative reconstruction technique. COMPARISON:  Chest CT 02/07/2022 and 06/17/2021. Prior chest radiographs, most recently done yesterday. FINDINGS: Cardiovascular: No significant vascular findings on noncontrast imaging. Trace pericardial fluid. The heart size is normal. Mediastinum/Nodes: Numerous residual small mediastinal, hilar and axillary lymph nodes are similar to previous study and although suboptimally evaluated without contrast, are likely reactive. The thyroid gland, trachea and esophagus demonstrate no significant findings. Lungs/Pleura: Interval enlargement of right greater than left pleural effusions. A small caliber pigtail catheter has been placed inferolaterally in the right pleural space. Worsening aeration of both lungs dependently, most consistent with atelectasis. Progressive enlargement of multiple pulmonary nodules bilaterally compared with the recent prior study. In the right upper lobe, there is a 1.6 cm nodule on image 27/5 (previously 1.1 cm). A lingular nodule measuring 2.2 cm on image 65/5 previously measured 1.5 cm, and a left lower lobe nodule measuring 1.6 cm on image 50/5 previously measured 1.1 cm. Cavitation of these nodules is less prominent than previously. Upper abdomen:  No acute findings.  Probable hepatic steatosis. Musculoskeletal/Chest wall: There is no chest wall mass or suspicious osseous finding. IMPRESSION: 1. Enlarging nodularity in both lungs compared with recent prior study of 6 days ago, most consistent with an infectious process (septic emboli or opportunistic infection). The rapidity of change would be unusual for Kaposi sarcoma. 2. Enlarging right greater than left pleural effusions following right pleural drainage catheter placement. 3. Grossly stable thoracic adenopathy, likely reactive. Electronically Signed   By: Richardean Sale M.D.   On: 02/13/2022 15:55   DG CHEST PORT 1  VIEW  Result Date: 02/12/2022 CLINICAL DATA:  Chest tube placement EXAM: PORTABLE CHEST 1 VIEW COMPARISON:  Earlier today at 9:24 a.m. FINDINGS: 5:36 p.m. Placement of a right-sided pigtail pleural catheter. Midline trachea. Normal heart size for level of inspiration. Decrease in small to moderate right-sided pleural effusion with possible loculation laterally. No pneumothorax. Lung volumes are extremely low. Slight improvement in right basilar aeration. Otherwise, similar basilar predominant airspace disease bilaterally. IMPRESSION: Placement of a right-sided pleural drain with decreased pleural fluid and improved right base aeration. Otherwise, similar bilateral airspace disease. Electronically Signed   By: Abigail Miyamoto M.D.   On: 02/12/2022 18:14   DG CHEST PORT 1 VIEW  Result Date: 02/12/2022 CLINICAL DATA:  Dyspnea. EXAM: PORTABLE CHEST 1 VIEW COMPARISON:  02/07/2022 FINDINGS: Stable cardiomediastinal contours. New moderate to large right pleural effusion with veil like opacification over the right lung. Persistent nodular pulmonary opacities throughout both lungs compatible with septic emboli. The visualized osseous structures are unremarkable. IMPRESSION: 1. New moderate to large right pleural effusion. 2. Persistent bilateral nodular pulmonary opacities compatible with septic emboli. Electronically Signed   By: Kerby Moors M.D.   On: 02/12/2022 09:24   ECHO TEE  Result Date: 02/11/2022    TRANSESOPHOGEAL ECHO REPORT   Patient Name:   Rick Lam Date of Exam: 02/10/2022 Medical Rec #:  [id number]        Height:       72.0 in Accession #:    [id number]       Weight:       175.2 lb Date of Birth:  1990-10-08        BSA:          2.014 m Patient Age:    41 years         BP:           119/70 mmHg Patient Gender: M                HR:           100 bpm. Exam Location:  Inpatient Procedure: Transesophageal Echo Indications:     Bacteremia  History:         Patient has prior history of Echocardiogram  examinations, most                  recent 02/08/2022. AIDS.  Sonographer:     Johny Chess RDCS Referring Phys:  [id number] Margie Billet Diagnosing Phys: Skeet Latch MD PROCEDURE: After discussion of the risks and benefits of a TEE, an informed consent was obtained from the patient. The transesophogeal probe was passed without difficulty through the esophogus of the patient. Imaged were obtained with the patient in a left lateral decubitus position. Sedation performed by different physician. The patient was monitored while under deep sedation. Anesthestetic sedation was provided intravenously by Anesthesiology: '203mg'$  of Propofol, '20mg'$  of Lidocaine. The patient's vital signs; including heart rate, blood pressure, and oxygen saturation; remained stable throughout the procedure. The patient developed no complications during the procedure. IMPRESSIONS  1. Left ventricular ejection fraction, by estimation, is 60 to 65%. The left ventricle has normal function. The left ventricle has no regional wall motion abnormalities.  2. Right ventricular systolic function is normal. The right ventricular size is normal.  3. No left atrial/left atrial appendage thrombus was detected.  4. The mitral valve is normal in structure. Trivial mitral valve regurgitation. No evidence of mitral stenosis.  5. The aortic valve is tricuspid. Aortic valve regurgitation is not visualized. No aortic stenosis is present.  6. The inferior vena cava is normal in size with greater than 50% respiratory variability, suggesting right atrial pressure of 3 mmHg. Conclusion(s)/Recommendation(s): Normal biventricular function without evidence of hemodynamically significant valvular heart disease. FINDINGS  Left Ventricle: Left ventricular ejection fraction, by estimation, is 60 to 65%. The left ventricle has normal function. The left ventricle has no regional wall motion abnormalities. The left ventricular internal cavity size was normal in size.  There is  no left ventricular hypertrophy. Right Ventricle: The right ventricular size is normal. No increase in right ventricular wall thickness. Right ventricular systolic function is normal. Left Atrium: Left atrial size was normal in size. No left atrial/left atrial appendage thrombus was detected. Right Atrium: Right atrial size was normal in size. Pericardium: There is no evidence of pericardial effusion. Mitral Valve: The mitral valve is normal in structure. Trivial mitral valve regurgitation. No evidence of mitral valve stenosis. Tricuspid Valve: The tricuspid valve is normal in structure. Tricuspid valve regurgitation is trivial. No evidence of tricuspid stenosis. Aortic Valve: The aortic valve is tricuspid. Aortic valve regurgitation is not visualized. No aortic stenosis is present. Pulmonic Valve: The pulmonic  valve was normal in structure. Pulmonic valve regurgitation is trivial. No evidence of pulmonic stenosis. Aorta: The aortic root is normal in size and structure. Venous: The inferior vena cava is normal in size with greater than 50% respiratory variability, suggesting right atrial pressure of 3 mmHg. IAS/Shunts: No atrial level shunt detected by color flow Doppler. Skeet Latch MD Electronically signed by Skeet Latch MD Signature Date/Time: 02/11/2022/12:04:23 PM    Final    MR HUMERUS LEFT W WO CONTRAST  Result Date: 02/09/2022 CLINICAL DATA:  Left arm infection. EXAM: MRI OF THE LEFT HUMERUS WITHOUT AND WITH CONTRAST TECHNIQUE: Multiplanar, multisequence MR imaging of the left humerus was performed before and after the administration of intravenous contrast. CONTRAST:  78m GADAVIST GADOBUTROL 1 MMOL/ML IV SOLN COMPARISON:  None Available. FINDINGS: Bones/Joint/Cartilage No marrow signal abnormality. No fracture or dislocation. Joint spaces are preserved. No joint effusion. Muscles and Tendons Intact. Mild edema in the medial triceps and biceps muscles adjacent to the brachial vessels  without enhancement, likely reactive. No muscle atrophy. Soft tissue Severe circumferential soft tissue swelling and enhancement of the upper arm. There is a small 1.5 x 1.0 x 1.6 cm rim enhancing fluid collection in the medial proximal upper arm just deep to the skin surface (series 11, image 20). There is a second slightly smaller 1.5 x 0.6 x 1.2 cm rim enhancing fluid collection in the lateral chest wall/axilla (series 11, image 21). Multifocal patchy airspace disease in the left lung. IMPRESSION: 1. Severe cellulitis of the upper arm, with two small 1.5 cm superficial abscesses in the medial proximal upper arm and lateral chest wall/axilla. Electronically Signed   By: WTitus DubinM.D.   On: 02/09/2022 07:59   ECHOCARDIOGRAM COMPLETE  Result Date: 02/08/2022    ECHOCARDIOGRAM REPORT   Patient Name:   Rick AMOSDate of Exam: 02/08/2022 Medical Rec #:  [id number]       Height:       72.0 in Accession #:    [id number]      Weight:       164.0 lb Date of Birth:  606/18/1992       BSA:          1.958 m Patient Age:    31 years         BP:           120/74 mmHg Patient Gender: M                HR:           120 bpm. Exam Location:  Inpatient Procedure: 2D Echo, Cardiac Doppler and Color Doppler Indications:    bacteremia  History:        Patient has no prior history of Echocardiogram examinations.                 Arrythmias:Tachycardia; Signs/Symptoms:Chest Pain, Shortness of                 Breath, Dyspnea and Fever.  Sonographer:    NGreer PickerelReferring Phys: [id number] Tansi 1. Left ventricular ejection fraction, by estimation, is 65 to 70%. The left ventricle has normal function. The left ventricle has no regional wall motion abnormalities. Left ventricular diastolic parameters were normal.  2. Right ventricular systolic function is normal. The right ventricular size is normal.  3. The mitral valve is normal in structure. No evidence of mitral valve regurgitation. No evidence of  mitral  stenosis.  4. The aortic valve has an indeterminant number of cusps. Aortic valve regurgitation is not visualized. No aortic stenosis is present.  5. The inferior vena cava is normal in size with greater than 50% respiratory variability, suggesting right atrial pressure of 3 mmHg. Comparison(s): No prior Echocardiogram. FINDINGS  Left Ventricle: Left ventricular ejection fraction, by estimation, is 65 to 70%. The left ventricle has normal function. The left ventricle has no regional wall motion abnormalities. The left ventricular internal cavity size was normal in size. There is  no left ventricular hypertrophy. Left ventricular diastolic parameters were normal. Right Ventricle: The right ventricular size is normal. No increase in right ventricular wall thickness. Right ventricular systolic function is normal. Left Atrium: Left atrial size was normal in size. Right Atrium: Right atrial size was normal in size. Pericardium: Trivial pericardial effusion is present. The pericardial effusion is circumferential. Mitral Valve: The mitral valve is normal in structure. No evidence of mitral valve regurgitation. No evidence of mitral valve stenosis. Tricuspid Valve: The tricuspid valve is normal in structure. Tricuspid valve regurgitation is trivial. No evidence of tricuspid stenosis. Aortic Valve: The aortic valve has an indeterminant number of cusps. Aortic valve regurgitation is not visualized. No aortic stenosis is present. Aortic valve mean gradient measures 6.0 mmHg. Aortic valve peak gradient measures 11.3 mmHg. Aortic valve area, by VTI measures 2.31 cm. Pulmonic Valve: The pulmonic valve was normal in structure. Pulmonic valve regurgitation is not visualized. No evidence of pulmonic stenosis. Aorta: The aortic root is normal in size and structure. Venous: The inferior vena cava is normal in size with greater than 50% respiratory variability, suggesting right atrial pressure of 3 mmHg. IAS/Shunts: No atrial  level shunt detected by color flow Doppler.  LEFT VENTRICLE PLAX 2D LVIDd:         4.30 cm   Diastology LVIDs:         2.40 cm   LV e' medial:    12.30 cm/s LV PW:         0.97 cm   LV E/e' medial:  9.3 LV IVS:        1.00 cm   LV e' lateral:   18.50 cm/s LVOT diam:     2.10 cm   LV E/e' lateral: 6.2 LV SV:         50 LV SV Index:   26 LVOT Area:     3.46 cm  RIGHT VENTRICLE RV Basal diam:  3.00 cm RV S prime:     15.90 cm/s TAPSE (M-mode): 1.8 cm LEFT ATRIUM             Index        RIGHT ATRIUM           Index LA diam:        3.50 cm 1.79 cm/m   RA Area:     10.70 cm LA Vol (A2C):   42.4 ml 21.65 ml/m  RA Volume:   22.20 ml  11.34 ml/m LA Vol (A4C):   24.3 ml 12.41 ml/m LA Biplane Vol: 32.2 ml 16.44 ml/m  AORTIC VALVE AV Area (Vmax):    2.58 cm AV Area (Vmean):   2.52 cm AV Area (VTI):     2.31 cm AV Vmax:           168.00 cm/s AV Vmean:          113.000 cm/s AV VTI:            0.217  m AV Peak Grad:      11.3 mmHg AV Mean Grad:      6.0 mmHg LVOT Vmax:         125.00 cm/s LVOT Vmean:        82.100 cm/s LVOT VTI:          0.145 m LVOT/AV VTI ratio: 0.67  AORTA Ao Root diam: 3.50 cm Ao Asc diam:  3.10 cm MITRAL VALVE MV Area (PHT): 4.49 cm     SHUNTS MV Decel Time: 169 msec     Systemic VTI:  0.14 m MV E velocity: 114.00 cm/s  Systemic Diam: 2.10 cm MV A velocity: 92.70 cm/s MV E/A ratio:  1.23 Kardie Tobb DO Electronically signed by Berniece Salines DO Signature Date/Time: 02/08/2022/5:26:56 PM    Final    VAS [place] LOWER EXTREMITY VENOUS (DVT)  Result Date: 02/08/2022  Lower Venous DVT Study Patient Name:  Rick Lam  Date of Exam:   02/08/2022 Medical Rec #: [id number]         Accession #:    [id number] Date of Birth: 1990-12-10         Patient Gender: M Patient Age:   73 years Exam [facility] Procedure:      VAS [place] LOWER EXTREMITY VENOUS (DVT) Referring Phys: Leslye Peer --------------------------------------------------------------------------------  Indications: Swelling.   Comparison Study: No previous exam noted. Performing Technologist: Bobetta Lime BS, RVT  Examination Guidelines: A complete evaluation includes B-mode imaging, spectral Doppler, color Doppler, and power Doppler as needed of all accessible portions of each vessel. Bilateral testing is considered an integral part of a complete examination. Limited examinations for reoccurring indications may be performed as noted. The reflux portion of the exam is performed with the patient in reverse Trendelenburg.  +---------+---------------+---------+-----------+----------+--------------+ RIGHT    CompressibilityPhasicitySpontaneityPropertiesThrombus Aging +---------+---------------+---------+-----------+----------+--------------+ CFV      Full           Yes      Yes                                 +---------+---------------+---------+-----------+----------+--------------+ SFJ      Full                                                        +---------+---------------+---------+-----------+----------+--------------+ FV Prox  Full                                                        +---------+---------------+---------+-----------+----------+--------------+ FV Mid   Full                                                        +---------+---------------+---------+-----------+----------+--------------+ FV DistalFull                                                        +---------+---------------+---------+-----------+----------+--------------+  PFV      Full                                                        +---------+---------------+---------+-----------+----------+--------------+ POP      Full           Yes      Yes                                 +---------+---------------+---------+-----------+----------+--------------+ PTV      Full                                                        +---------+---------------+---------+-----------+----------+--------------+  PERO     Full                                                        +---------+---------------+---------+-----------+----------+--------------+   +---------+---------------+---------+-----------+----------+--------------+ LEFT     CompressibilityPhasicitySpontaneityPropertiesThrombus Aging +---------+---------------+---------+-----------+----------+--------------+ CFV      Full           Yes      Yes                                 +---------+---------------+---------+-----------+----------+--------------+ SFJ      Full                                                        +---------+---------------+---------+-----------+----------+--------------+ FV Prox  Full                                                        +---------+---------------+---------+-----------+----------+--------------+ FV Mid   Full                                                        +---------+---------------+---------+-----------+----------+--------------+ FV DistalFull                                                        +---------+---------------+---------+-----------+----------+--------------+ PFV      Full                                                        +---------+---------------+---------+-----------+----------+--------------+  POP      Full           Yes      Yes                                 +---------+---------------+---------+-----------+----------+--------------+ PTV      Full                                                        +---------+---------------+---------+-----------+----------+--------------+ PERO     Full                                                        +---------+---------------+---------+-----------+----------+--------------+     Summary: BILATERAL: - No evidence of deep vein thrombosis seen in the lower extremities, bilaterally. -No evidence of popliteal cyst, bilaterally.   *See table(s) above for measurements and  observations. Electronically signed by Jamelle Haring on 02/08/2022 at 4:26:09 PM.    Final    CT Chest W Contrast  Result Date: 02/07/2022 CLINICAL DATA:  Left axillary infection. Multiple lung nodule seen on chest x-ray. History of HIV. EXAM: CT CHEST WITH CONTRAST TECHNIQUE: Multidetector CT imaging of the chest was performed during intravenous contrast administration. RADIATION DOSE REDUCTION: This exam was performed according to the departmental dose-optimization program which includes automated exposure control, adjustment of the mA and/or kV according to patient size and/or use of iterative reconstruction technique. CONTRAST:  57m OMNIPAQUE IOHEXOL 300 MG/ML  SOLN COMPARISON:  Chest x-ray from same day. CT chest dated June 17, 2021. FINDINGS: Cardiovascular: No significant vascular findings. Normal heart size. No pericardial effusion. Mediastinum/Nodes: Prominent subcentimeter mediastinal, bilateral hilar, and bilateral axillary lymph nodes have slightly decreased in size compared to prior study, and remain likely reactive. The thyroid gland, trachea, and esophagus demonstrate no significant findings. Lungs/Pleura: Scattered pulmonary nodules throughout both lungs, several of which are cavitated. Scattered mild smooth interlobular septal thickening. Trace left-greater-than-right pleural effusions. No pneumothorax. Upper Abdomen: No acute abnormality. Musculoskeletal: Asymmetric nodular skin thickening and subcutaneous edema in the left axilla. No acute or significant osseous findings. IMPRESSION: 1. Innumerable pulmonary nodules scattered throughout both lungs, several of which are cavitated. Differential considerations include septic pulmonary emboli as well as atypical bacterial or fungal pneumonia given history of HIV. 2. Asymmetric nodular skin thickening and subcutaneous edema in the left axilla, concerning for cellulitis. No discrete abscess. 3. Trace bilateral pleural effusions. Electronically  Signed   By: WTitus DubinM.D.   On: 02/07/2022 18:29   DG Chest Port 1 View  Result Date: 02/07/2022 CLINICAL DATA:  Possible sepsis.  History of HIV. EXAM: PORTABLE CHEST 1 VIEW COMPARISON:  CT of the chest on 06/17/2021 FINDINGS: The heart size and mediastinal contours are within normal limits. Abnormal nodular airspace opacities in both lungs, particularly in the perihilar and lower lung zones bilaterally. Infectious etiology favored including potential septic emboli. No associated overt pulmonary edema, pneumothorax or pleural fluid identified. The visualized skeletal structures are unremarkable. IMPRESSION: Abnormal nodular airspace opacities in both lungs. Favor infectious etiology including potential septic emboli. Consider further evaluation with CT of the  chest with contrast. Electronically Signed   By: Aletta Edouard M.D.   On: 02/07/2022 17:20    Microbiology: Results for orders placed or performed during the hospital encounter of 02/07/22  Blood Culture (routine x 2)     Status: Abnormal   Collection Time: 02/07/22  5:01 PM   Specimen: BLOOD RIGHT HAND  Result Value Ref Range Status   Specimen Description   Final    BLOOD RIGHT HAND Performed at [facility], [address], [place], [place] [address]    Special Requests   Final    BOTTLES DRAWN AEROBIC AND ANAEROBIC Blood Culture results may not be optimal due to an inadequate volume of blood received in culture bottles Performed at [place] [place], [place] [address]    Culture  Setup Time   Final    GRAM POSITIVE COCCI BOTTLES DRAWN AEROBIC AND ANAEROBIC Gram Stain Report Called to,Read Back By and Verified With: GUILBEAULT,D '@0745'$  BY MATTHEWS, B 8.16.2023 IN BOTH AEROBIC AND ANAEROBIC BOTTLES    Culture (A)  Final    STAPHYLOCOCCUS AUREUS SUSCEPTIBILITIES PERFORMED ON PREVIOUS CULTURE WITHIN THE LAST 5 DAYS. Performed at [facility], [place] [address], [place], [place] [address]    Report  Status 02/10/2022 FINAL  Final  Blood Culture (routine x 2)     Status: Abnormal   Collection Time: 02/07/22  5:01 PM   Specimen: BLOOD RIGHT ARM  Result Value Ref Range Status   Specimen Description   Final    BLOOD RIGHT ARM Performed at [facility], [address] [place], [place] [address]    Special Requests   Final    BOTTLES DRAWN AEROBIC AND ANAEROBIC Blood Culture results may not be optimal due to an inadequate volume of blood received in culture bottles Performed at [place] [place], [place] [address]    Culture  Setup Time   Final    GRAM POSITIVE COCCI BOTTLES DRAWN AEROBIC AND ANAEROBIC Gram Stain Report Called to,Read Back By and Verified With: GUILBEAULT,D'@0745'$  BY MATTHEWS, B 8.16.2023 IN BOTH AEROBIC AND ANAEROBIC BOTTLES GRAM STAIN REVIEWED-AGREE WITH RESULT CRITICAL RESULT CALLED TO, READ BACK BY AND VERIFIED WITH: Noemi Chapel [id number] AT 1411 BY EC Performed at [facility], [place] [address] [place], [place] [address]    Culture METHICILLIN RESISTANT STAPHYLOCOCCUS AUREUS (A)  Final   Report Status 02/10/2022 FINAL  Final   Organism ID, Bacteria METHICILLIN RESISTANT STAPHYLOCOCCUS AUREUS  Final      Susceptibility   Methicillin resistant staphylococcus aureus - MIC*    CIPROFLOXACIN >=8 RESISTANT Resistant     ERYTHROMYCIN >=8 RESISTANT Resistant     GENTAMICIN <=0.5 SENSITIVE Sensitive     OXACILLIN >=4 RESISTANT Resistant     TETRACYCLINE >=16 RESISTANT Resistant     VANCOMYCIN <=0.5 SENSITIVE Sensitive     TRIMETH/SULFA >=320 RESISTANT Resistant     CLINDAMYCIN <=0.25 SENSITIVE Sensitive     RIFAMPIN <=0.5 SENSITIVE Sensitive     Inducible Clindamycin NEGATIVE Sensitive     * METHICILLIN RESISTANT STAPHYLOCOCCUS AUREUS  Blood Culture ID Panel (Reflexed)     Status: Abnormal   Collection Time: 02/07/22  5:01 PM  Result Value Ref Range Status   Enterococcus faecalis NOT DETECTED NOT DETECTED Final   Enterococcus  Faecium NOT DETECTED NOT DETECTED Final   Listeria monocytogenes NOT DETECTED NOT DETECTED Final   Staphylococcus species DETECTED (A) NOT DETECTED Final    Comment:  CRITICAL RESULT CALLED TO, READ BACK BY AND VERIFIED WITH: [place] [id number] AT 1411 BY EC    Staphylococcus aureus (BCID) DETECTED (A) NOT DETECTED Final    Comment: Methicillin (oxacillin)-resistant Staphylococcus aureus (MRSA). MRSA is predictably resistant to beta-lactam antibiotics (except ceftaroline). Preferred therapy is vancomycin unless clinically contraindicated. Patient requires contact precautions if  hospitalized. CRITICAL RESULT CALLED TO, READ BACK BY AND VERIFIED WITH: [place] [id number] AT 39 BY EC    Staphylococcus epidermidis NOT DETECTED NOT DETECTED Final   Staphylococcus lugdunensis NOT DETECTED NOT DETECTED Final   Streptococcus species NOT DETECTED NOT DETECTED Final   Streptococcus agalactiae NOT DETECTED NOT DETECTED Final   Streptococcus pneumoniae NOT DETECTED NOT DETECTED Final   Streptococcus pyogenes NOT DETECTED NOT DETECTED Final   A.calcoaceticus-baumannii NOT DETECTED NOT DETECTED Final   Bacteroides fragilis NOT DETECTED NOT DETECTED Final   Enterobacterales NOT DETECTED NOT DETECTED Final   Enterobacter cloacae complex NOT DETECTED NOT DETECTED Final   Escherichia coli NOT DETECTED NOT DETECTED Final   Klebsiella aerogenes NOT DETECTED NOT DETECTED Final   Klebsiella oxytoca NOT DETECTED NOT DETECTED Final   Klebsiella pneumoniae NOT DETECTED NOT DETECTED Final   Proteus species NOT DETECTED NOT DETECTED Final   Salmonella species NOT DETECTED NOT DETECTED Final   Serratia marcescens NOT DETECTED NOT DETECTED Final   Haemophilus influenzae NOT DETECTED NOT DETECTED Final   Neisseria meningitidis NOT DETECTED NOT DETECTED Final   Pseudomonas aeruginosa NOT DETECTED NOT DETECTED Final   Stenotrophomonas maltophilia NOT DETECTED NOT DETECTED Final    Candida albicans NOT DETECTED NOT DETECTED Final   Candida auris NOT DETECTED NOT DETECTED Final   Candida glabrata NOT DETECTED NOT DETECTED Final   Candida krusei NOT DETECTED NOT DETECTED Final   Candida parapsilosis NOT DETECTED NOT DETECTED Final   Candida tropicalis NOT DETECTED NOT DETECTED Final   Cryptococcus neoformans/gattii NOT DETECTED NOT DETECTED Final   Meth resistant mecA/C and MREJ DETECTED (A) NOT DETECTED Final    Comment: CRITICAL RESULT CALLED TO, READ BACK BY AND VERIFIED WITH: Noemi Chapel [id number] AT 1411 BY EC Performed at [facility], [address], [place], [place] [address]   Resp Panel by RT-PCR (Flu A&B, Covid) Anterior Nasal Swab     Status: None   Collection Time: 02/07/22  5:20 PM   Specimen: Anterior Nasal Swab  Result Value Ref Range Status   SARS Coronavirus 2 by RT PCR NEGATIVE NEGATIVE Final    Comment: (NOTE) SARS-CoV-2 target nucleic acids are NOT DETECTED.  The SARS-CoV-2 RNA is generally detectable in upper respiratory specimens during the acute phase of infection. The lowest concentration of SARS-CoV-2 viral copies this assay can detect is 138 copies/mL. A negative result does not preclude SARS-Cov-2 infection and should not be used as the sole basis for treatment or other patient management decisions. A negative result may occur with  improper specimen collection/handling, submission of specimen other than nasopharyngeal swab, presence of viral mutation(s) within the areas targeted by this assay, and inadequate number of viral copies(<138 copies/mL). A negative result must be combined with clinical observations, patient history, and epidemiological information. The expected result is Negative.  Fact Sheet for Patients:  EntrepreneurPulse.com.au  Fact Sheet for Healthcare Providers:  IncredibleEmployment.be  This test is no t yet approved or cleared by the [place] FDA and   has been authorized for detection and/or diagnosis of SARS-CoV-2 by FDA under an Emergency Use Authorization (EUA). This  EUA will remain  in effect (meaning this test can be used) for the duration of the COVID-19 declaration under Section 564(b)(1) of the Act, 21 U.S.C.section 360bbb-3(b)(1), unless the authorization is terminated  or revoked sooner.       Influenza A by PCR NEGATIVE NEGATIVE Final   Influenza B by PCR NEGATIVE NEGATIVE Final    Comment: (NOTE) The Xpert Xpress SARS-CoV-2/FLU/RSV plus assay is intended as an aid in the diagnosis of influenza from Nasopharyngeal swab specimens and should not be used as a sole basis for treatment. Nasal washings and aspirates are unacceptable for Xpert Xpress SARS-CoV-2/FLU/RSV testing.  Fact Sheet for Patients: EntrepreneurPulse.com.au  Fact Sheet for Healthcare Providers: IncredibleEmployment.be  This test is not yet approved or cleared by the [place] FDA and has been authorized for detection and/or diagnosis of SARS-CoV-2 by FDA under an Emergency Use Authorization (EUA). This EUA will remain in effect (meaning this test can be used) for the duration of the COVID-19 declaration under Section 564(b)(1) of the Act, 21 U.S.C. section 360bbb-3(b)(1), unless the authorization is terminated or revoked.  Performed at [facility], [address] [place], [place] [address]   Urine Culture     Status: None   Collection Time: 02/07/22 11:32 PM   Specimen: In/Out Cath Urine  Result Value Ref Range Status   Specimen Description IN/OUT CATH URINE  Final   Special Requests NONE  Final   Culture   Final    NO GROWTH Performed at [facility], [place] [address], [place], [place] [address]    Report Status 02/08/2022 FINAL  Final  Culture, blood (Routine X 2) w Reflex to ID Panel     Status: None   Collection Time: 02/09/22  4:32 AM   Specimen: BLOOD RIGHT HAND  Result Value Ref Range  Status   Specimen Description BLOOD RIGHT HAND  Final   Special Requests   Final    AEROBIC BOTTLE ONLY Blood Culture results may not be optimal due to an excessive volume of blood received in culture bottles   Culture   Final    NO GROWTH 5 DAYS Performed at [facility], [place] [address] [place], [place] [address]    Report Status 02/14/2022 FINAL  Final  Culture, blood (Routine X 2) w Reflex to ID Panel     Status: None   Collection Time: 02/09/22  4:32 AM   Specimen: BLOOD LEFT HAND  Result Value Ref Range Status   Specimen Description BLOOD LEFT HAND  Final   Special Requests   Final    AEROBIC BOTTLE ONLY Blood Culture results may not be optimal due to an excessive volume of blood received in culture bottles   Culture   Final    NO GROWTH 5 DAYS Performed at [facility], [place] [address], [place],  [address]    Report Status 02/14/2022 FINAL  Final  Culture, blood (Routine X 2) w Reflex to ID Panel     Status: None   Collection Time: 02/11/22  3:49 AM   Specimen: BLOOD LEFT HAND  Result Value Ref Range Status   Specimen Description BLOOD LEFT HAND  Final   Special Requests   Final    BOTTLES DRAWN AEROBIC ONLY Blood Culture results may not be optimal due to an inadequate volume of blood received in culture bottles   Culture   Final    NO GROWTH 5 DAYS Performed at [facility], [address] [place], [place] [address]    Report Status 02/16/2022 FINAL  Final  Culture, blood (Routine X 2) w Reflex to ID Panel     Status: None   Collection Time: 02/11/22  3:57 AM   Specimen: BLOOD RIGHT HAND  Result Value Ref Range Status   Specimen Description BLOOD RIGHT HAND  Final   Special Requests   Final    BOTTLES DRAWN AEROBIC ONLY Blood Culture results may not be optimal due to an inadequate volume of blood received in culture bottles   Culture   Final    NO GROWTH 5 DAYS Performed at [facility], [place] [address] [place], [place] [address]     Report Status 02/16/2022 FINAL  Final  Body fluid culture w Gram Stain     Status: None   Collection Time: 02/12/22  4:46 PM   Specimen: Pleural Fluid  Result Value Ref Range Status   Specimen Description FLUID  Final   Special Requests Immunocompromised  Final   Gram Stain NO WBC SEEN NO ORGANISMS SEEN   Final   Culture   Final    NO GROWTH 3 DAYS Performed at [place] [address], [place], [place] [address]    Report Status 02/16/2022 FINAL  Final  Fungus Culture With Stain     Status: None (Preliminary result)   Collection Time: 02/12/22  4:46 PM   Specimen: Pleural Fluid  Result Value Ref Range Status   Fungus Stain Final report  Final    Comment: (NOTE) Performed At: [facility] [place], [place] [id number] Rush Farmer MD DS:[id number]    Fungus (Mycology) Culture PENDING  Incomplete   Fungal Source FLUID  Final    Comment: Performed at [facility], [place] [address] [place], [place] [address]  Fungus Culture Result     Status: None   Collection Time: 02/12/22  4:46 PM  Result Value Ref Range Status   Result 1 Comment  Final    Comment: (NOTE) KOH/Calcofluor preparation:  no fungus observed. Performed At: [facility] [place], [place] [id number] Rush Farmer MD [id number]   MRSA Next Gen by PCR, Nasal     Status: Abnormal   Collection Time: 02/14/22  7:22 PM   Specimen: Nasal Mucosa; Nasal Swab  Result Value Ref Range Status   MRSA by PCR Next Gen DETECTED (A) NOT DETECTED Final    Comment: RESULT CALLED TO, READ BACK BY AND VERIFIED WITH: RN Eulogio Ditch [phone] '@2311'$  FH (NOTE) The GeneXpert MRSA Assay (FDA approved for NASAL specimens only), is one component of a comprehensive MRSA colonization surveillance program. It is not intended to diagnose MRSA infection nor to guide or monitor treatment for MRSA infections. Test performance is not FDA approved in patients less than 40  years old. Performed at [facility], [place] [address] [place], [place] [address]   Body fluid culture w Gram Stain     Status: None (Preliminary result)   Collection Time: 02/20/22  2:02 PM   Specimen: Pleural Fluid  Result Value Ref Range Status   Specimen Description FLUID PLEURAL  Final   Special Requests NONE  Final   Gram Stain NO WBC SEEN NO ORGANISMS SEEN   Final   Culture   Final    NO GROWTH < 24 HOURS Performed at [facility], [place] [address], [place], [place] [address]    Report Status PENDING  Incomplete    Labs: CBC: Recent Labs  Lab 02/16/22 0600 02/17/22 0606 02/18/22 0639 02/19/22 0528 02/20/22 0540  WBC 11.4* 8.9 6.1 5.7 6.1  HGB 10.5* 9.2* 8.7* 10.0* 10.4*  HCT 32.3* 28.3* 27.0* 30.2* 31.9*  MCV 83.7 83.0 83.6 83.0 81.8  PLT 440* 547* 630* 723* 929*   Basic Metabolic Panel: Recent Labs  Lab 02/16/22 0600 02/17/22 0606 02/18/22 0639 02/19/22 0528 02/20/22 0540 02/21/22 0532  NA 131* 131* 135 134* 136  --   K 4.4 4.1 4.6 4.3 4.0  --   CL 97* 102 104 102 103  --   CO2 '25 27 26 25 24  '$ --   GLUCOSE 132* 94 91 89 85  --   BUN '15 14 14 9 6  '$ --   CREATININE 0.69 0.64 0.68 0.63 0.63 0.79  CALCIUM 7.5* 7.2* 7.7* 8.0* 8.1*  --    Liver Function Tests: Recent Labs  Lab 02/20/22 1518  PROT 7.6  ALBUMIN 2.1*   CBG: No results for input(s): "GLUCAP" in the last 168 hours.  Discharge time spent: greater than 30 minutes.  Signed: Elmarie Shiley, MD [facility] 02/21/2022

## 2022-02-22 ENCOUNTER — Encounter (HOSPITAL_COMMUNITY): Payer: Self-pay | Admitting: Pulmonary Disease

## 2022-02-22 LAB — CYTOLOGY - NON PAP

## 2022-02-22 NOTE — Telephone Encounter (Signed)
Error

## 2022-02-23 LAB — ACID FAST SMEAR (AFB, MYCOBACTERIA): Acid Fast Smear: NEGATIVE

## 2022-02-24 LAB — BODY FLUID CULTURE W GRAM STAIN
Culture: NO GROWTH
Gram Stain: NONE SEEN

## 2022-02-27 LAB — ALBUMIN, PLEURAL OR PERITONEAL FLUID: Albumin, Fluid: 1.6 g/dL

## 2022-02-27 LAB — BODY FLUID CELL COUNT WITH DIFFERENTIAL
Eos, Fluid: 0 %
Lymphs, Fluid: 10 %
Monocyte-Macrophage-Serous Fluid: 5 % — ABNORMAL LOW (ref 50–90)
Neutrophil Count, Fluid: 85 % — ABNORMAL HIGH (ref 0–25)
Total Nucleated Cell Count, Fluid: 320 cu mm (ref 0–1000)

## 2022-02-27 LAB — GLUCOSE, PLEURAL OR PERITONEAL FLUID: Glucose, Fluid: 55 mg/dL

## 2022-02-27 LAB — LACTATE DEHYDROGENASE, PLEURAL OR PERITONEAL FLUID: LD, Fluid: 417 U/L — ABNORMAL HIGH (ref 3–23)

## 2022-02-27 LAB — PROTEIN, PLEURAL OR PERITONEAL FLUID: Total protein, fluid: 4 g/dL

## 2022-03-01 ENCOUNTER — Encounter (HOSPITAL_COMMUNITY)
Admission: RE | Admit: 2022-03-01 | Discharge: 2022-03-01 | Disposition: A | Discharge status: Home / Self Care | Payer: Self-pay | Source: Ambulatory Visit | Attending: Internal Medicine | Admitting: Internal Medicine

## 2022-03-01 ENCOUNTER — Encounter (HOSPITAL_COMMUNITY): Payer: Self-pay | Admitting: Internal Medicine

## 2022-03-01 VITALS — BP 125/71 | HR 99 | Temp 98.4°F | Resp 18

## 2022-03-01 DIAGNOSIS — L02414 Cutaneous abscess of left upper limb: Secondary | ICD-10-CM | POA: Insufficient documentation

## 2022-03-01 DIAGNOSIS — B9562 Methicillin resistant Staphylococcus aureus infection as the cause of diseases classified elsewhere: Secondary | ICD-10-CM | POA: Insufficient documentation

## 2022-03-01 DIAGNOSIS — R7881 Bacteremia: Secondary | ICD-10-CM | POA: Insufficient documentation

## 2022-03-01 LAB — COMPREHENSIVE METABOLIC PANEL
ALT: 15 U/L (ref 0–44)
AST: 19 U/L (ref 15–41)
Albumin: 3.1 g/dL — ABNORMAL LOW (ref 3.5–5.0)
Alkaline Phosphatase: 92 U/L (ref 38–126)
Anion gap: 8 (ref 5–15)
BUN: 11 mg/dL (ref 6–20)
CO2: 25 mmol/L (ref 22–32)
Calcium: 9 mg/dL (ref 8.9–10.3)
Chloride: 105 mmol/L (ref 98–111)
Creatinine, Ser: 0.9 mg/dL (ref 0.61–1.24)
GFR, Estimated: >60 mL/min (ref >60)
Glucose, Bld: 84 mg/dL (ref 70–99)
Potassium: 4.4 mmol/L (ref 3.5–5.1)
Sodium: 138 mmol/L (ref 135–145)
Total Bilirubin: 0.3 mg/dL (ref 0.3–1.2)
Total Protein: 8.8 g/dL — ABNORMAL HIGH (ref 6.5–8.1)

## 2022-03-01 LAB — CBC
HCT: 38.7 % — ABNORMAL LOW (ref 39.0–52.0)
Hemoglobin: 12.5 g/dL — ABNORMAL LOW (ref 13.0–17.0)
MCH: 27.4 pg (ref 26.0–34.0)
MCHC: 32.3 g/dL (ref 30.0–36.0)
MCV: 84.7 fL (ref 80.0–100.0)
Platelets: 527 10*3/uL — ABNORMAL HIGH (ref 150–400)
RBC: 4.57 MIL/uL (ref 4.22–5.81)
RDW: 16.1 % — ABNORMAL HIGH (ref 11.5–15.5)
WBC: 6.3 10*3/uL (ref 4.0–10.5)
nRBC: 0 % (ref 0.0–0.2)

## 2022-03-01 LAB — SEDIMENTATION RATE: Sed Rate: 36 mm/hr — ABNORMAL HIGH (ref 0–16)

## 2022-03-01 LAB — C-REACTIVE PROTEIN: CRP: 0.8 mg/dL (ref <1.0)

## 2022-03-01 MED ORDER — SODIUM CHLORIDE 0.9% FLUSH: 10.0000 mL | INTRAVENOUS | Status: DC | PRN

## 2022-03-01 MED ORDER — DEXTROSE 5 % IV SOLN
1000.0000 mg | Freq: Once | INTRAVENOUS | Status: AC
  Administered 2022-03-01: 1000 mg via INTRAVENOUS
  Filled 2022-03-01: qty 50

## 2022-03-08 ENCOUNTER — Encounter (HOSPITAL_COMMUNITY)
Admission: RE | Admit: 2022-03-08 | Discharge: 2022-03-08 | Disposition: A | Discharge status: Home / Self Care | Payer: Self-pay | Source: Ambulatory Visit | Attending: Internal Medicine | Admitting: Internal Medicine

## 2022-03-08 VITALS — BP 112/76 | HR 88 | Temp 97.4°F | Resp 18

## 2022-03-08 DIAGNOSIS — L02414 Cutaneous abscess of left upper limb: Secondary | ICD-10-CM

## 2022-03-08 DIAGNOSIS — R7881 Bacteremia: Secondary | ICD-10-CM

## 2022-03-08 LAB — COMPREHENSIVE METABOLIC PANEL
ALT: 14 U/L (ref 0–44)
AST: 17 U/L (ref 15–41)
Albumin: 3.2 g/dL — ABNORMAL LOW (ref 3.5–5.0)
Alkaline Phosphatase: 93 U/L (ref 38–126)
Anion gap: 4 — ABNORMAL LOW (ref 5–15)
BUN: 9 mg/dL (ref 6–20)
CO2: 25 mmol/L (ref 22–32)
Calcium: 8.7 mg/dL — ABNORMAL LOW (ref 8.9–10.3)
Chloride: 107 mmol/L (ref 98–111)
Creatinine, Ser: 0.93 mg/dL (ref 0.61–1.24)
GFR, Estimated: >60 mL/min (ref >60)
Glucose, Bld: 85 mg/dL (ref 70–99)
Potassium: 3.8 mmol/L (ref 3.5–5.1)
Sodium: 136 mmol/L (ref 135–145)
Total Bilirubin: 0.5 mg/dL (ref 0.3–1.2)
Total Protein: 8.2 g/dL — ABNORMAL HIGH (ref 6.5–8.1)

## 2022-03-08 LAB — CBC
HCT: 35.2 % — ABNORMAL LOW (ref 39.0–52.0)
Hemoglobin: 11.3 g/dL — ABNORMAL LOW (ref 13.0–17.0)
MCH: 27.5 pg (ref 26.0–34.0)
MCHC: 32.1 g/dL (ref 30.0–36.0)
MCV: 85.6 fL (ref 80.0–100.0)
Platelets: 398 10*3/uL (ref 150–400)
RBC: 4.11 MIL/uL — ABNORMAL LOW (ref 4.22–5.81)
RDW: 16.6 % — ABNORMAL HIGH (ref 11.5–15.5)
WBC: 5.6 10*3/uL (ref 4.0–10.5)
nRBC: 0 % (ref 0.0–0.2)

## 2022-03-08 MED ORDER — SODIUM CHLORIDE 0.9% FLUSH: 10.0000 mL | INTRAVENOUS | Status: DC | PRN

## 2022-03-08 MED ORDER — DEXTROSE 5 % IV SOLN
500.0000 mg | INTRAVENOUS | Status: DC
  Administered 2022-03-08: 500 mg via INTRAVENOUS
  Filled 2022-03-08: qty 25

## 2022-03-09 ENCOUNTER — Encounter: Payer: Self-pay | Admitting: Internal Medicine

## 2022-03-09 ENCOUNTER — Other Ambulatory Visit: Payer: Self-pay

## 2022-03-09 ENCOUNTER — Ambulatory Visit (INDEPENDENT_AMBULATORY_CARE_PROVIDER_SITE_OTHER): Payer: Self-pay | Admitting: Internal Medicine

## 2022-03-09 VITALS — BP 111/72 | HR 76 | Resp 16 | Ht 72.0 in | Wt 156.0 lb

## 2022-03-09 DIAGNOSIS — B9562 Methicillin resistant Staphylococcus aureus infection as the cause of diseases classified elsewhere: Secondary | ICD-10-CM

## 2022-03-09 DIAGNOSIS — B20 Human immunodeficiency virus [HIV] disease: Secondary | ICD-10-CM

## 2022-03-09 DIAGNOSIS — R7881 Bacteremia: Secondary | ICD-10-CM
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
## 2022-03-09 NOTE — Patient Instructions (Signed)
Continue what you are doing with biktarvy/bactrim  Finish 3 more shots of dalbavancin   See me in 3 months   Blood tests today

## 2022-03-13 LAB — COMPLETE METABOLIC PANEL WITH GFR
AG Ratio: 0.8 (calc) — ABNORMAL LOW (ref 1.0–2.5)
ALT: 10 U/L (ref 9–46)
AST: 12 U/L (ref 10–40)
Albumin: 3.7 g/dL (ref 3.6–5.1)
Alkaline phosphatase (APISO): 102 U/L (ref 36–130)
BUN: 7 mg/dL (ref 7–25)
CO2: 24 mmol/L (ref 20–32)
Calcium: 9 mg/dL (ref 8.6–10.3)
Chloride: 105 mmol/L (ref 98–110)
Creat: 0.93 mg/dL (ref 0.60–1.26)
Globulin: 4.5 g/dL (calc) — ABNORMAL HIGH (ref 1.9–3.7)
Glucose, Bld: 72 mg/dL (ref 65–99)
Potassium: 4.4 mmol/L (ref 3.5–5.3)
Sodium: 137 mmol/L (ref 135–146)
Total Bilirubin: 0.3 mg/dL (ref 0.2–1.2)
Total Protein: 8.2 g/dL — ABNORMAL HIGH (ref 6.1–8.1)
eGFR: 111 mL/min/{1.73_m2} (ref >=60)

## 2022-03-13 LAB — CBC
HCT: 38.9 % (ref 38.5–50.0)
Hemoglobin: 12.8 g/dL — ABNORMAL LOW (ref 13.2–17.1)
MCH: 27.2 pg (ref 27.0–33.0)
MCHC: 32.9 g/dL (ref 32.0–36.0)
MCV: 82.8 fL (ref 80.0–100.0)
MPV: 10 fL (ref 7.5–12.5)
Platelets: 373 10*3/uL (ref 140–400)
RBC: 4.7 10*6/uL (ref 4.20–5.80)
RDW: 16.8 % — ABNORMAL HIGH (ref 11.0–15.0)
WBC: 4.5 10*3/uL (ref 3.8–10.8)

## 2022-03-13 LAB — HIV-1 RNA QUANT-NO REFLEX-BLD
HIV 1 RNA Quant: <20 Copies/mL — ABNORMAL HIGH
HIV-1 RNA Quant, Log: <1.3 Log cps/mL — ABNORMAL HIGH

## 2022-03-14 LAB — FUNGUS CULTURE RESULT

## 2022-03-14 LAB — FUNGUS CULTURE WITH STAIN

## 2022-03-14 LAB — FUNGAL ORGANISM REFLEX

## 2022-03-15 ENCOUNTER — Encounter (HOSPITAL_COMMUNITY)
Admission: RE | Admit: 2022-03-15 | Discharge: 2022-03-15 | Disposition: A | Discharge status: Home / Self Care | Payer: Self-pay | Source: Ambulatory Visit | Attending: Internal Medicine | Admitting: Internal Medicine

## 2022-03-15 VITALS — BP 127/76 | HR 84 | Temp 97.9°F | Resp 16 | Ht 72.0 in | Wt 163.0 lb

## 2022-03-15 DIAGNOSIS — B9562 Methicillin resistant Staphylococcus aureus infection as the cause of diseases classified elsewhere: Secondary | ICD-10-CM

## 2022-03-15 DIAGNOSIS — L02414 Cutaneous abscess of left upper limb: Secondary | ICD-10-CM

## 2022-03-15 LAB — COMPREHENSIVE METABOLIC PANEL
ALT: 14 U/L (ref 0–44)
AST: 16 U/L (ref 15–41)
Albumin: 3.5 g/dL (ref 3.5–5.0)
Alkaline Phosphatase: 84 U/L (ref 38–126)
Anion gap: 8 (ref 5–15)
BUN: 14 mg/dL (ref 6–20)
CO2: 23 mmol/L (ref 22–32)
Calcium: 8.9 mg/dL (ref 8.9–10.3)
Chloride: 106 mmol/L (ref 98–111)
Creatinine, Ser: 1.1 mg/dL (ref 0.61–1.24)
GFR, Estimated: >60 mL/min (ref >60)
Glucose, Bld: 98 mg/dL (ref 70–99)
Potassium: 4 mmol/L (ref 3.5–5.1)
Sodium: 137 mmol/L (ref 135–145)
Total Bilirubin: 0.6 mg/dL (ref 0.3–1.2)
Total Protein: 7.9 g/dL (ref 6.5–8.1)

## 2022-03-15 LAB — SEDIMENTATION RATE: Sed Rate: 23 mm/hr — ABNORMAL HIGH (ref 0–16)

## 2022-03-15 LAB — CBC
HCT: 37.3 % — ABNORMAL LOW (ref 39.0–52.0)
Hemoglobin: 11.6 g/dL — ABNORMAL LOW (ref 13.0–17.0)
MCH: 27.4 pg (ref 26.0–34.0)
MCHC: 31.1 g/dL (ref 30.0–36.0)
MCV: 88 fL (ref 80.0–100.0)
Platelets: 286 10*3/uL (ref 150–400)
RBC: 4.24 MIL/uL (ref 4.22–5.81)
RDW: 17.4 % — ABNORMAL HIGH (ref 11.5–15.5)
WBC: 4.5 10*3/uL (ref 4.0–10.5)
nRBC: 0 % (ref 0.0–0.2)

## 2022-03-15 LAB — C-REACTIVE PROTEIN: CRP: 0.7 mg/dL (ref <1.0)

## 2022-03-15 MED ORDER — DEXTROSE 5 % IV SOLN
500.0000 mg | INTRAVENOUS | Status: DC
  Administered 2022-03-15: 500 mg via INTRAVENOUS
  Filled 2022-03-15: qty 25

## 2022-03-15 MED ORDER — SODIUM CHLORIDE 0.9% FLUSH: 10.0000 mL | INTRAVENOUS | Status: DC | PRN

## 2022-03-22 ENCOUNTER — Encounter (HOSPITAL_COMMUNITY)
Admission: RE | Admit: 2022-03-22 | Discharge: 2022-03-22 | Disposition: A | Discharge status: Home / Self Care | Payer: Self-pay | Source: Ambulatory Visit | Attending: Internal Medicine | Admitting: Internal Medicine

## 2022-03-22 VITALS — BP 114/68 | HR 99 | Temp 98.4°F | Resp 18

## 2022-03-22 DIAGNOSIS — R7881 Bacteremia: Secondary | ICD-10-CM

## 2022-03-22 DIAGNOSIS — L02414 Cutaneous abscess of left upper limb: Secondary | ICD-10-CM

## 2022-03-22 LAB — COMPREHENSIVE METABOLIC PANEL
ALT: 20 U/L (ref 0–44)
AST: 33 U/L (ref 15–41)
Albumin: 3.3 g/dL — ABNORMAL LOW (ref 3.5–5.0)
Alkaline Phosphatase: 79 U/L (ref 38–126)
Anion gap: 5 (ref 5–15)
BUN: 8 mg/dL (ref 6–20)
CO2: 23 mmol/L (ref 22–32)
Calcium: 8.2 mg/dL — ABNORMAL LOW (ref 8.9–10.3)
Chloride: 108 mmol/L (ref 98–111)
Creatinine, Ser: 1.03 mg/dL (ref 0.61–1.24)
GFR, Estimated: >60 mL/min (ref >60)
Glucose, Bld: 133 mg/dL — ABNORMAL HIGH (ref 70–99)
Potassium: 3.8 mmol/L (ref 3.5–5.1)
Sodium: 136 mmol/L (ref 135–145)
Total Bilirubin: 0.8 mg/dL (ref 0.3–1.2)
Total Protein: 7 g/dL (ref 6.5–8.1)

## 2022-03-22 LAB — FUNGUS CULTURE WITH STAIN

## 2022-03-22 LAB — CBC
HCT: 35.2 % — ABNORMAL LOW (ref 39.0–52.0)
Hemoglobin: 11.3 g/dL — ABNORMAL LOW (ref 13.0–17.0)
MCH: 28 pg (ref 26.0–34.0)
MCHC: 32.1 g/dL (ref 30.0–36.0)
MCV: 87.3 fL (ref 80.0–100.0)
Platelets: 251 10*3/uL (ref 150–400)
RBC: 4.03 MIL/uL — ABNORMAL LOW (ref 4.22–5.81)
RDW: 17.3 % — ABNORMAL HIGH (ref 11.5–15.5)
WBC: 3.7 10*3/uL — ABNORMAL LOW (ref 4.0–10.5)
nRBC: 0 % (ref 0.0–0.2)

## 2022-03-22 LAB — FUNGAL ORGANISM REFLEX

## 2022-03-22 LAB — FUNGUS CULTURE RESULT

## 2022-03-22 MED ORDER — SODIUM CHLORIDE 0.9% FLUSH: 10.0000 mL | INTRAVENOUS | Status: DC | PRN

## 2022-03-22 MED ORDER — DEXTROSE 5 % IV SOLN
500.0000 mg | INTRAVENOUS | Status: DC
  Administered 2022-03-22: 500 mg via INTRAVENOUS
  Filled 2022-03-22: qty 25

## 2022-03-29 ENCOUNTER — Encounter (HOSPITAL_COMMUNITY)
Admission: RE | Admit: 2022-03-29 | Discharge: 2022-03-29 | Disposition: A | Discharge status: Home / Self Care | Payer: Self-pay | Source: Ambulatory Visit | Attending: Internal Medicine | Admitting: Internal Medicine

## 2022-03-29 VITALS — BP 110/99 | HR 100 | Temp 97.6°F

## 2022-03-29 DIAGNOSIS — L02414 Cutaneous abscess of left upper limb: Secondary | ICD-10-CM | POA: Insufficient documentation

## 2022-03-29 DIAGNOSIS — B9562 Methicillin resistant Staphylococcus aureus infection as the cause of diseases classified elsewhere: Secondary | ICD-10-CM | POA: Insufficient documentation

## 2022-03-29 DIAGNOSIS — R7881 Bacteremia: Secondary | ICD-10-CM | POA: Insufficient documentation

## 2022-03-29 LAB — CBC
HCT: 40.1 % (ref 39.0–52.0)
Hemoglobin: 13.2 g/dL (ref 13.0–17.0)
MCH: 28.4 pg (ref 26.0–34.0)
MCHC: 32.9 g/dL (ref 30.0–36.0)
MCV: 86.2 fL (ref 80.0–100.0)
Platelets: 337 10*3/uL (ref 150–400)
RBC: 4.65 MIL/uL (ref 4.22–5.81)
RDW: 16.7 % — ABNORMAL HIGH (ref 11.5–15.5)
WBC: 4 10*3/uL (ref 4.0–10.5)
nRBC: 0 % (ref 0.0–0.2)

## 2022-03-29 LAB — C-REACTIVE PROTEIN: CRP: 1.4 mg/dL — ABNORMAL HIGH (ref <1.0)

## 2022-03-29 LAB — COMPREHENSIVE METABOLIC PANEL
ALT: 16 U/L (ref 0–44)
AST: 17 U/L (ref 15–41)
Albumin: 3.7 g/dL (ref 3.5–5.0)
Alkaline Phosphatase: 85 U/L (ref 38–126)
Anion gap: 8 (ref 5–15)
BUN: 13 mg/dL (ref 6–20)
CO2: 22 mmol/L (ref 22–32)
Calcium: 8.5 mg/dL — ABNORMAL LOW (ref 8.9–10.3)
Chloride: 105 mmol/L (ref 98–111)
Creatinine, Ser: 0.88 mg/dL (ref 0.61–1.24)
GFR, Estimated: >60 mL/min (ref >60)
Glucose, Bld: 106 mg/dL — ABNORMAL HIGH (ref 70–99)
Potassium: 3.6 mmol/L (ref 3.5–5.1)
Sodium: 135 mmol/L (ref 135–145)
Total Bilirubin: 0.5 mg/dL (ref 0.3–1.2)
Total Protein: 7.8 g/dL (ref 6.5–8.1)

## 2022-03-29 LAB — SEDIMENTATION RATE: Sed Rate: 10 mm/hr (ref 0–16)

## 2022-03-29 MED ORDER — SODIUM CHLORIDE 0.9% FLUSH: 10.0000 mL | INTRAVENOUS | Status: DC | PRN

## 2022-03-29 MED ORDER — DEXTROSE 5 % IV SOLN
500.0000 mg | INTRAVENOUS | Status: DC
  Administered 2022-03-29: 500 mg via INTRAVENOUS
  Filled 2022-03-29: qty 25

## 2022-04-06 LAB — ACID FAST CULTURE WITH REFLEXED SENSITIVITIES (MYCOBACTERIA): Acid Fast Culture: NEGATIVE

## 2022-04-10 ENCOUNTER — Other Ambulatory Visit: Payer: Self-pay | Admitting: Internal Medicine

## 2022-04-10 DIAGNOSIS — B20 Human immunodeficiency virus [HIV] disease: Secondary | ICD-10-CM

## 2022-06-08 ENCOUNTER — Ambulatory Visit: Payer: Medicaid Other | Admitting: Internal Medicine

## 2022-06-09 ENCOUNTER — Encounter: Payer: Self-pay | Admitting: Internal Medicine

## 2022-06-09 ENCOUNTER — Ambulatory Visit (INDEPENDENT_AMBULATORY_CARE_PROVIDER_SITE_OTHER): Payer: Self-pay | Admitting: Internal Medicine

## 2022-06-09 ENCOUNTER — Other Ambulatory Visit: Payer: Self-pay

## 2022-06-09 VITALS — BP 117/76 | HR 100 | Temp 97.8°F | Wt 166.0 lb

## 2022-06-09 DIAGNOSIS — B9562 Methicillin resistant Staphylococcus aureus infection as the cause of diseases classified elsewhere: Secondary | ICD-10-CM

## 2022-06-09 DIAGNOSIS — R7881 Bacteremia: Secondary | ICD-10-CM

## 2022-06-09 DIAGNOSIS — B20 Human immunodeficiency virus [HIV] disease: Secondary | ICD-10-CM

## 2022-06-09 MED ORDER — BICTEGRAVIR-EMTRICITAB-TENOFOV 50-200-25 MG PO TABS: 1.0000 | ORAL_TABLET | Freq: Every day | ORAL | 11 refills | Status: DC

## 2022-06-09 NOTE — Patient Instructions (Signed)
See me in 08/2022 and will do all the labs then   New rx biktarvy is placed   Sometimes spring 2024 we can consider cabenuva injection

## 2022-06-09 NOTE — Addendum Note (Signed)
Addended by: Prudencio Pair T on: 06/09/2022 10:49 AM   Modules accepted: Orders
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
## 2022-06-14 ENCOUNTER — Other Ambulatory Visit: Payer: Self-pay | Admitting: Internal Medicine

## 2022-07-04 ENCOUNTER — Other Ambulatory Visit (HOSPITAL_COMMUNITY): Payer: Self-pay

## 2022-07-04 ENCOUNTER — Encounter (HOSPITAL_COMMUNITY): Payer: Self-pay | Admitting: Internal Medicine

## 2022-07-05 ENCOUNTER — Ambulatory Visit: Payer: Medicaid Other

## 2022-07-05 ENCOUNTER — Ambulatory Visit: Payer: Medicaid Other | Admitting: Internal Medicine

## 2022-07-15 ENCOUNTER — Encounter (HOSPITAL_COMMUNITY): Payer: Self-pay | Admitting: Internal Medicine

## 2022-07-15 ENCOUNTER — Other Ambulatory Visit (HOSPITAL_COMMUNITY): Payer: Self-pay

## 2022-08-02 ENCOUNTER — Encounter: Payer: Self-pay | Admitting: Internal Medicine

## 2022-08-03 MED ORDER — VALACYCLOVIR HCL 1 G PO TABS: 1000.0000 mg | ORAL_TABLET | Freq: Two times a day (BID) | ORAL | 2 refills | Status: DC

## 2022-08-15 ENCOUNTER — Other Ambulatory Visit: Payer: Self-pay

## 2022-08-27 ENCOUNTER — Other Ambulatory Visit (HOSPITAL_COMMUNITY): Payer: Self-pay

## 2022-08-27 ENCOUNTER — Other Ambulatory Visit: Payer: Self-pay

## 2022-09-11 ENCOUNTER — Encounter (HOSPITAL_COMMUNITY): Payer: Self-pay | Admitting: Internal Medicine

## 2022-09-18 ENCOUNTER — Telehealth: Payer: Self-pay

## 2022-09-18 ENCOUNTER — Encounter: Payer: Self-pay | Admitting: Internal Medicine

## 2022-09-18 ENCOUNTER — Other Ambulatory Visit (HOSPITAL_COMMUNITY): Payer: Self-pay

## 2022-09-18 ENCOUNTER — Ambulatory Visit: Payer: BLUE CROSS/BLUE SHIELD | Admitting: Internal Medicine

## 2022-09-18 ENCOUNTER — Other Ambulatory Visit: Payer: Self-pay

## 2022-09-18 ENCOUNTER — Encounter (HOSPITAL_COMMUNITY): Payer: Self-pay | Admitting: Internal Medicine

## 2022-09-18 VITALS — BP 102/67 | HR 95 | Temp 97.9°F | Ht 72.0 in | Wt 173.0 lb

## 2022-09-18 DIAGNOSIS — Z113 Encounter for screening for infections with a predominantly sexual mode of transmission: Secondary | ICD-10-CM

## 2022-09-18 DIAGNOSIS — B20 Human immunodeficiency virus [HIV] disease: Secondary | ICD-10-CM | POA: Diagnosis not present

## 2022-09-18 DIAGNOSIS — Z1159 Encounter for screening for other viral diseases: Secondary | ICD-10-CM
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
## 2022-09-18 NOTE — Patient Instructions (Signed)
I will refer your case to our pharmacy team for cabenuva   Continue biktarvy/bactrim for now   See me in 6 months (if you qualify for cabenuva, you will have pharmacy visits as well to get the shots)   Thank you

## 2022-09-18 NOTE — Telephone Encounter (Signed)
[facility] Patient Advocate Encounter   Received notification from [facility] that prior authorization for [place] is required.(Pharmacy Benefits)   PA submitted on 09/18/22 [place] Status is pending    [facility] will continue to follow.   Ileene Patrick, [facility] Patient [facility] for Infectious Disease Phone: [phone] Fax:  [phone]

## 2022-09-19 LAB — T-HELPER CELLS (CD4) COUNT (NOT AT ARMC)
CD4 % Helper T Cell: 12 % — ABNORMAL LOW (ref 33–65)
CD4 T Cell Abs: 198 /uL — ABNORMAL LOW (ref 400–1790)

## 2022-09-21 LAB — CBC
HCT: 48.2 % (ref 38.5–50.0)
Hemoglobin: 16.2 g/dL (ref 13.2–17.1)
MCH: 30.2 pg (ref 27.0–33.0)
MCHC: 33.6 g/dL (ref 32.0–36.0)
MCV: 89.8 fL (ref 80.0–100.0)
MPV: 10.4 fL (ref 7.5–12.5)
Platelets: 343 10*3/uL (ref 140–400)
RBC: 5.37 10*6/uL (ref 4.20–5.80)
RDW: 15.3 % — ABNORMAL HIGH (ref 11.0–15.0)
WBC: 3.8 10*3/uL (ref 3.8–10.8)

## 2022-09-21 LAB — HEPATITIS B SURFACE ANTIGEN: Hepatitis B Surface Ag: NONREACTIVE

## 2022-09-21 LAB — COMPLETE METABOLIC PANEL WITH GFR
AG Ratio: 1.3 (calc) (ref 1.0–2.5)
ALT: 18 U/L (ref 9–46)
AST: 20 U/L (ref 10–40)
Albumin: 4.6 g/dL (ref 3.6–5.1)
Alkaline phosphatase (APISO): 93 U/L (ref 36–130)
BUN: 17 mg/dL (ref 7–25)
CO2: 23 mmol/L (ref 20–32)
Calcium: 9.4 mg/dL (ref 8.6–10.3)
Chloride: 102 mmol/L (ref 98–110)
Creat: 1.11 mg/dL (ref 0.60–1.26)
Globulin: 3.6 g/dL (calc) (ref 1.9–3.7)
Glucose, Bld: 108 mg/dL — ABNORMAL HIGH (ref 65–99)
Potassium: 4.2 mmol/L (ref 3.5–5.3)
Sodium: 137 mmol/L (ref 135–146)
Total Bilirubin: 0.7 mg/dL (ref 0.2–1.2)
Total Protein: 8.2 g/dL — ABNORMAL HIGH (ref 6.1–8.1)
eGFR: 90 mL/min/{1.73_m2} (ref >=60)

## 2022-09-21 LAB — RPR TITER: RPR Titer: 1:4 {titer} — ABNORMAL HIGH

## 2022-09-21 LAB — HEPATITIS B CORE ANTIBODY, TOTAL: Hep B Core Total Ab: NONREACTIVE

## 2022-09-21 LAB — HEPATITIS B SURFACE ANTIBODY, QUANTITATIVE: Hep B S AB Quant (Post): <5 m[IU]/mL — ABNORMAL LOW (ref >=10)

## 2022-09-21 LAB — RPR: RPR Ser Ql: REACTIVE — AB

## 2022-09-21 LAB — HIV-1 RNA QUANT-NO REFLEX-BLD
HIV 1 RNA Quant: NOT DETECTED Copies/mL
HIV-1 RNA Quant, Log: NOT DETECTED Log cps/mL

## 2022-09-21 LAB — T PALLIDUM AB: T Pallidum Abs: POSITIVE — AB

## 2022-10-02 ENCOUNTER — Telehealth: Payer: Self-pay

## 2022-10-02 NOTE — Telephone Encounter (Signed)
[name], [name]

## 2022-10-09 ENCOUNTER — Telehealth: Payer: Self-pay

## 2022-10-09 NOTE — Telephone Encounter (Signed)
Called Jerral to counsel on Cabenuva and he is willing to start after hearing the benefits. Will schedule him for this week.   Blane Ohara, PharmD  PGY1 Pharmacy Resident

## 2022-10-09 NOTE — Telephone Encounter (Signed)
[facility] Patient Advocate Encounter   Was successful in obtaining Patient into the [employer] Portal.          Clearance Coots, CPhT Specialty Pharmacy Patient [facility] for Infectious Disease Phone: [phone] Fax:  [phone]

## 2022-10-11 ENCOUNTER — Other Ambulatory Visit: Payer: Self-pay

## 2022-10-11 ENCOUNTER — Ambulatory Visit (INDEPENDENT_AMBULATORY_CARE_PROVIDER_SITE_OTHER): Payer: BLUE CROSS/BLUE SHIELD | Admitting: Pharmacist

## 2022-10-11 DIAGNOSIS — Z23 Encounter for immunization: Secondary | ICD-10-CM

## 2022-10-11 DIAGNOSIS — B20 Human immunodeficiency virus [HIV] disease: Secondary | ICD-10-CM

## 2022-10-11 MED ORDER — CABOTEGRAVIR & RILPIVIRINE ER 600 & 900 MG/3ML IM SUER
1.0000 | Freq: Once | INTRAMUSCULAR | Status: AC
  Administered 2022-10-11: 1 via INTRAMUSCULAR

## 2022-10-11 NOTE — Progress Notes (Signed)
NEW REFERRAL TO CPP CLINIC  

## 2022-10-11 NOTE — Progress Notes (Signed)
HPI: Rick Lam is a 32 y.o. male who presents to the [facility] pharmacy clinic for [place] administration.  Patient Active Problem List   Diagnosis Date Noted   [facility]    Pleural effusion    Acute septic pulmonary embolism without acute cor pulmonale    Cutaneous abscess of left upper extremity    Acute respiratory failure with hypoxia 02/08/2022   Cellulitis of left arm    Multifocal pneumonia    MRSA bacteremia    Severe sepsis 02/07/2022   Hyponatremia 02/07/2022   Chronic hepatitis C without hepatic coma 07/06/2021   AIDS (acquired immune deficiency syndrome) 06/01/2021   Oral thrush 06/01/2021   Rash 06/01/2021   Syphilis 06/01/2021   Kaposi sarcoma 06/01/2021   Dyspnea 06/01/2021    Patient's Medications  New Prescriptions   No medications on file  Previous Medications   BIKTARVY 50-200-25 MG TABS TABLET    TAKE 1 TABLET BY MOUTH DAILY   BUPROPION (WELLBUTRIN XL) 300 MG 24 HR TABLET    Take 300 mg by mouth daily.   BUSPIRONE (BUSPAR) 5 MG TABLET    Take 5 mg by mouth 3 (three) times daily.   ESCITALOPRAM (LEXAPRO) 5 MG TABLET    Take 1 tablet (5 mg total) by mouth daily.   GUAIFENESIN (MUCINEX) 600 MG 12 HR TABLET    Take 1 tablet (600 mg total) by mouth 2 (two) times daily.   POLYETHYLENE GLYCOL POWDER (GLYCOLAX/MIRALAX) 17 GM/SCOOP POWDER    Take 17 g by mouth 2 (two) times daily.   SULFAMETHOXAZOLE-TRIMETHOPRIM (BACTRIM DS) 800-160 MG TABLET    Take 1 tablet by mouth 3 (three) times a week.   TRAZODONE (DESYREL) 100 MG TABLET    Take 1 tablet (100 mg total) by mouth at bedtime as needed for sleep.   VALACYCLOVIR (VALTREX) 1000 MG TABLET    Take 1 tablet (1,000 mg total) by mouth 2 (two) times daily.  Modified Medications   No medications on file  Discontinued Medications   No medications on file    Allergies: Allergies  Allergen Reactions   Banana     Other reaction(s): hives    Past Medical History: Past Medical History:  Diagnosis Date   AIDS  (acquired immune deficiency syndrome) (HCC) 06/01/2021   dx'ed 2010. Doesn't recall cd4 nadir. Sexual transmission. 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 No fever, chill, nightsweat. Never been on bactrim   Syphilis 06/01/2021   Remember getting 1 IM shot 04/2021; 02/2021 rpr 128. Doesn't recall rash before that. No concerning sx now via ROS as of 06/01/2021     Thrush 06/01/2021   Has had what he thinks is thrush for 3 months prior to 05/2021 visit. Lots of problem with pain when swallowing and also white plaque on tongue and throat  Also has hypertrophic nodule along  gum lines  Started getting medication with spit and squish x3 separate times no effect. Received them at emergency room    Social History: Social History   Socioeconomic History   Marital status: Single    Spouse name: Not on file   Number of children: Not on file   Years of education: Not on file   Highest education level: Not on file  Occupational History   Not on file  Tobacco Use   Smoking status: Never   Smokeless tobacco: Never  Substance and Sexual Activity   Alcohol use: Not Currently    Comment: Occ   Drug use: No   Sexual activity: Not Currently    Comment: declined condoms  Other Topics Concern   Not on file  Social History Narrative   Not on file   Social Determinants of Health   Financial Resource Strain: Not on file  Food  Insecurity: Not on file  Transportation Needs: Not on file  Physical Activity: Not on file  Stress: Not on file  Social Connections: Not on file    Labs: Lab Results  Component Value Date   HIV1RNAQUANT Not Detected 09/18/2022   HIV1RNAQUANT <20 (H) 03/09/2022   HIV1RNAQUANT 16,800 02/09/2022   CD4TABS 198 (L) 09/18/2022   CD4TABS 57 (L) 07/06/2021    RPR and STI Lab Results  Component Value Date   LABRPR REACTIVE (A) 09/18/2022   LABRPR REACTIVE (A) 01/13/2022   LABRPR REACTIVE (A) 06/01/2021   LABRPR Reactive (A) 03/11/2021   LABRPR Reactive (A) 11/26/2017   RPRTITER 1:4 (H) 09/18/2022   RPRTITER 1:8 (H) 01/13/2022   RPRTITER 1:32 (H) 06/01/2021        No data to display          Hepatitis B Lab Results  Component Value Date   HEPBSAG NON-REACTIVE 09/18/2022   HEPBCAB NON-REACTIVE 09/18/2022   Hepatitis C Lab Results  Component Value Date   HEPCAB REACTIVE (A) 06/01/2021   HCVRNAPCRQN 77 (H) 06/01/2021   Hepatitis A Lab Results  Component Value Date   HAV NON-REACTIVE 06/01/2021   Lipids: No results found for: "CHOL", "TRIG", "HDL", "CHOLHDL", "VLDL", "LDLCALC"  Current HIV Regimen: Biktarvy  TARGET DATE: The 17th   Assessment: Josep presents today for his first initiation injection for Cabenuva. Counseled that [place] is two separate intramuscular injections in the gluteal muscle on each side for each visit. Explained that the second injection is 30 days after the initial injection then every 2 months thereafter. Discussed the rare but significant chance of developing resistance despite compliance. Explained that showing up to injection appointments is very important and warned that if 2 appointments are missed, it will be reassessed by their provider whether they are a good candidate for injection therapy. Counseled on possible side effects associated with the injections such as injection site pain, which is usually mild to moderate in nature,  injection site nodules, and injection site reactions. Asked to call the clinic or send me a mychart message if they experience any issues, such as fatigue, nausea, headache, rash, or dizziness. Advised that they can take ibuprofen or tylenol for injection site pain if needed.   Administered cabotegravir 600mg /20mL in left upper outer quadrant of the gluteal muscle. Administered rilpivirine 900 mg/89mL in the right upper outer quadrant of the gluteal muscle. Monitored patient for 10 minutes after injection. Injections were tolerated well without issue. Counseled to stop taking Biktarvy after today's dose and to call with any issues that may arise.  Will make follow up appointments for second initiation injection in 30 days and then maintenance injections every 2 months thereafter.   [place] declines STI testing today as he has had no new partners and no symptoms. He is excited to receive the Cabenuva as he recently stopped taking Bactrim so he has no daily pills to take. He is eligible for the hepatitis A, hepatitis B, meningococcal, pneumococcal, tetanus, and HPV vaccine series. He agrees to get the hepatitis B and HPV vaccine today. He will follow up to complete these series over the next several months. We counseled [place] not to rub the injection site as this can cause the medication to be disrupted under the skin.   Plan: - Stop Biktarvy after today's dose - First Cabenuva injections administered - Second initiation injection scheduled for May 16th, 2024 - Maintenance injections scheduled for July 16th, 2024  - Hepatitis B and HPV vaccines administered today  - Call with any issues or questions  Blane Ohara, PharmD  PGY1 Pharmacy Resident

## 2022-10-13 LAB — HIV-1 RNA QUANT-NO REFLEX-BLD
HIV 1 RNA Quant: NOT DETECTED Copies/mL
HIV-1 RNA Quant, Log: NOT DETECTED Log cps/mL

## 2022-10-20 ENCOUNTER — Other Ambulatory Visit (HOSPITAL_COMMUNITY): Payer: Self-pay

## 2022-10-26 ENCOUNTER — Encounter (HOSPITAL_COMMUNITY): Payer: Self-pay | Admitting: Internal Medicine

## 2022-11-08 NOTE — Progress Notes (Unsigned)
HPI: Rick Lam is a 32 y.o. male who presents to the [facility] pharmacy clinic for [place] administration.  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   BUSPIRONE (BUSPAR) 5 MG TABLET    Take 5 mg by mouth 3 (three) times daily.   ESCITALOPRAM (LEXAPRO) 5 MG TABLET    Take 1 tablet (5 mg total) by mouth daily.   GUAIFENESIN (MUCINEX) 600 MG 12 HR TABLET    Take 1 tablet (600 mg total) by mouth 2 (two) times daily.   POLYETHYLENE GLYCOL POWDER (GLYCOLAX/MIRALAX) 17 GM/SCOOP POWDER    Take 17 g by mouth 2 (two) times daily.   SULFAMETHOXAZOLE-TRIMETHOPRIM (BACTRIM DS) 800-160 MG TABLET    Take 1 tablet by mouth 3 (three) times a week.   TRAZODONE (DESYREL) 100 MG TABLET    Take 1 tablet (100 mg total) by mouth at bedtime as needed for sleep.  Modified Medications   No medications on file  Discontinued Medications   No medications on file    Allergies: Allergies  Allergen Reactions   Banana     Other reaction(s): hives    Past Medical History: Past Medical History:  Diagnosis Date   AIDS (acquired immune deficiency syndrome) (HCC) 06/01/2021   dx'ed  2010. Doesn't recall cd4 nadir. Sexual transmission. 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 No fever, chill, nightsweat. Never been on bactrim   Syphilis 06/01/2021   Remember getting 1 IM shot 04/2021; 02/2021 rpr 128. Doesn't recall rash before that. No concerning sx now via ROS as of 06/01/2021     Thrush 06/01/2021   Has had what he thinks is thrush for 3 months prior to 05/2021 visit. Lots of problem with pain when swallowing and also white plaque on tongue and throat  Also has hypertrophic nodule along gum lines  Started getting medication with spit and squish x3 separate times no effect.  Received them at emergency room    Social History: Social History   Socioeconomic History   Marital status: Single    Spouse name: Not on file   Number of children: Not on file   Years of education: Not on file   Highest education level: Not on file  Occupational History   Not on file  Tobacco Use   Smoking status: Never   Smokeless tobacco: Never  Substance and Sexual Activity   Alcohol use: Not Currently    Comment: Occ   Drug use: No   Sexual activity: Not Currently    Comment: declined condoms  Other Topics Concern   Not on file  Social History Narrative   Not on file   Social Determinants of Health   Financial Resource Strain: Not on file  Food Insecurity: Not on file  Transportation Needs: Not on file   Physical Activity: Not on file  Stress: Not on file  Social Connections: Not on file    Labs: Lab Results  Component Value Date   HIV1RNAQUANT Not Detected 10/11/2022   HIV1RNAQUANT Not Detected 09/18/2022   HIV1RNAQUANT <20 (H) 03/09/2022   CD4TABS 198 (L) 09/18/2022   CD4TABS 57 (L) 07/06/2021    RPR and STI Lab Results  Component Value Date   LABRPR REACTIVE (A) 09/18/2022   LABRPR REACTIVE (A) 01/13/2022   LABRPR REACTIVE (A) 06/01/2021   LABRPR Reactive (A) 03/11/2021   LABRPR Reactive (A) 11/26/2017   RPRTITER 1:4 (H) 09/18/2022   RPRTITER 1:8 (H) 01/13/2022   RPRTITER 1:32 (H) 06/01/2021   Hepatitis B Lab Results  Component Value Date   HEPBSAG NON-REACTIVE 09/18/2022   HEPBCAB NON-REACTIVE 09/18/2022   Hepatitis  Lab Results  Component Value Date   HEPCAB REACTIVE (A) 06/01/2021   HCVRNAPCRQN 77 (H) 06/01/2021   Hepatitis A Lab Results  Component Value Date   HAV NON-REACTIVE 06/01/2021   Lipids: No results found for: "CHOL", "TRIG", "HDL", "CHOLHDL", "VLDL", "LDLCALC"  TARGET DATE: the 17th   Assessment: Rick Lam presents today for his second Cabenuva injection. Past injection was tolerated well without issues. Last STI screening was 09/18/22 and was negative. No new sexual partners since last visit. He politely declines STI testing today. Not taking Bactrim. We will obtain CD4 count to verify this continues to uptrend. Last CD4 count in 08/2022 was 198.   He is eligible for multiple vaccinations today, including second dose of the hepatitis B, second dose of HPV, meningococcal, pneumococcal, hepatitis A, and Tdap.   Administered cabotegravir 600mg /68mL in left upper outer quadrant of the gluteal muscle. Administered rilpivirine 900 mg/64mL in the right upper outer quadrant of the gluteal muscle. No issues with injections. He will follow up in 2 months for next set of injections.  Plan: - Cabenuva injections administered - Administered the following  vaccinations: 2/2 HBV, 2/3 HPV, meningococcal, pneumococcal, HAV, and Tdap.  - Will be due for final meningococcal vaccination 7/24, final HPV in 10/24, and final HAV 11/24.  - Obtain HIV RNA and CD4 count today - Next injections scheduled for 01/09/23 with Cassie and 03/21/23 with Dr. Renold Don - Call with any issues or questions  Irish Elders, PharmD PGY-1 [facility] Pharmacy Resident

## 2022-11-09 ENCOUNTER — Other Ambulatory Visit: Payer: Self-pay

## 2022-11-09 ENCOUNTER — Ambulatory Visit (INDEPENDENT_AMBULATORY_CARE_PROVIDER_SITE_OTHER): Payer: Self-pay | Admitting: Pharmacist

## 2022-11-09 DIAGNOSIS — Z113 Encounter for screening for infections with a predominantly sexual mode of transmission: Secondary | ICD-10-CM

## 2022-11-09 DIAGNOSIS — B20 Human immunodeficiency virus [HIV] disease: Secondary | ICD-10-CM | POA: Diagnosis not present

## 2022-11-09 DIAGNOSIS — Z23 Encounter for immunization: Secondary | ICD-10-CM

## 2022-11-09 MED ORDER — CABOTEGRAVIR & RILPIVIRINE ER 600 & 900 MG/3ML IM SUER
1.0000 | Freq: Once | INTRAMUSCULAR | Status: AC
Start: 2022-11-09 — End: 2022-11-09
  Administered 2022-11-09: 1 via INTRAMUSCULAR

## 2022-11-10 LAB — T-HELPER CELLS (CD4) COUNT (NOT AT ARMC)
CD4 % Helper T Cell: 13 % — ABNORMAL LOW (ref 33–65)
CD4 T Cell Abs: 177 /uL — ABNORMAL LOW (ref 400–1790)

## 2022-11-12 LAB — HIV-1 RNA QUANT-NO REFLEX-BLD
HIV 1 RNA Quant: NOT DETECTED Copies/mL
HIV-1 RNA Quant, Log: NOT DETECTED Log cps/mL

## 2022-11-13 ENCOUNTER — Encounter: Payer: Self-pay | Admitting: Internal Medicine

## 2022-11-13 ENCOUNTER — Telehealth: Payer: Self-pay

## 2022-11-13 ENCOUNTER — Other Ambulatory Visit (HOSPITAL_COMMUNITY): Payer: Self-pay

## 2022-11-13 ENCOUNTER — Other Ambulatory Visit: Payer: Self-pay

## 2022-11-13 ENCOUNTER — Encounter (HOSPITAL_COMMUNITY): Payer: Self-pay | Admitting: Internal Medicine

## 2022-11-13 DIAGNOSIS — B20 Human immunodeficiency virus [HIV] disease: Secondary | ICD-10-CM

## 2022-11-13 MED ORDER — SULFAMETHOXAZOLE-TRIMETHOPRIM 800-160 MG PO TABS
1.0000 | ORAL_TABLET | ORAL | 0 refills | Status: DC
Start: 2022-11-13 — End: 2022-11-16
  Filled 2022-11-13: qty 12, 28d supply, fill #0

## 2022-11-13 NOTE — Telephone Encounter (Signed)
Attempted to reach Rick Lam regarding his CD4 results. Let him know to give [place] a call back.  He will need to reinitate Bactrim as his CD4 count is downtrending.   Irish Elders, PharmD PGY-1 [facility] Pharmacy Resident

## 2022-11-14 NOTE — Telephone Encounter (Signed)
Explained to Rick Lam that his CD4 count went down a bit from last month. Plan to restart Bactrim 3 times weekly at this time and continue to monitor.  He vocalized understanding. All questions and concerns addressed.  Irish Elders, PharmD PGY-1 [facility] Pharmacy Resident

## 2022-11-15 ENCOUNTER — Other Ambulatory Visit: Payer: Self-pay

## 2022-11-15 MED ORDER — VALACYCLOVIR HCL 1 G PO TABS: 1000.0000 mg | ORAL_TABLET | Freq: Two times a day (BID) | ORAL | 2 refills | Status: AC

## 2022-11-16 ENCOUNTER — Encounter: Payer: Self-pay | Admitting: Internal Medicine

## 2022-11-16 ENCOUNTER — Other Ambulatory Visit (HOSPITAL_COMMUNITY): Payer: Self-pay

## 2022-11-16 DIAGNOSIS — B20 Human immunodeficiency virus [HIV] disease: Secondary | ICD-10-CM

## 2022-11-16 MED ORDER — SULFAMETHOXAZOLE-TRIMETHOPRIM 800-160 MG PO TABS
1.0000 | ORAL_TABLET | ORAL | 0 refills | Status: DC
Start: 2022-11-17 — End: 2022-11-30

## 2022-11-29 ENCOUNTER — Encounter: Payer: Self-pay | Admitting: Internal Medicine

## 2022-11-29 DIAGNOSIS — B20 Human immunodeficiency virus [HIV] disease: Secondary | ICD-10-CM

## 2022-11-30 ENCOUNTER — Other Ambulatory Visit: Payer: Self-pay

## 2022-11-30 DIAGNOSIS — B20 Human immunodeficiency virus [HIV] disease: Secondary | ICD-10-CM

## 2022-11-30 MED ORDER — SULFAMETHOXAZOLE-TRIMETHOPRIM 800-160 MG PO TABS
1.0000 | ORAL_TABLET | ORAL | 0 refills | Status: DC
Start: 2022-11-30 — End: 2023-04-24

## 2022-11-30 MED ORDER — SULFAMETHOXAZOLE-TRIMETHOPRIM 800-160 MG PO TABS
1.0000 | ORAL_TABLET | ORAL | 0 refills | Status: DC
Start: 2022-12-01 — End: 2022-11-30

## 2022-12-15 ENCOUNTER — Telehealth: Payer: Medicaid Other | Admitting: Physician Assistant

## 2022-12-15 ENCOUNTER — Encounter (HOSPITAL_COMMUNITY): Payer: Self-pay | Admitting: Internal Medicine

## 2022-12-15 ENCOUNTER — Ambulatory Visit: Payer: Medicaid Other

## 2022-12-15 DIAGNOSIS — R0602 Shortness of breath: Secondary | ICD-10-CM

## 2022-12-15 DIAGNOSIS — R051 Acute cough: Secondary | ICD-10-CM

## 2022-12-15 NOTE — Progress Notes (Signed)
Because of describing having shortness of breath with severe shoulder pain and having a personal history of hypoxia with respiratory failure, I feel your condition warrants further evaluation and I recommend that you be seen in a face to face visit. It would be best to have someone listen to your lungs and possibly consider getting a chest x-ray as well.    NOTE: There will be NO CHARGE for this eVisit   If you are having a true medical emergency please call 911.      For an urgent face to face visit, [place] has eight urgent care centers for your convenience:   NEW!! [facility] Health Urgent Care Center at [facility] Get Driving Directions [phone] [address], [address] [place], [address]    [facility] Urgent Care Center at [facility] Get Driving Directions [phone] [address] [place], [place] [address]   [facility] Health Urgent Care Center [facility]) Get Driving Directions [phone] [address] [place], [place] [address]  [facility] Health Urgent Care Center [facility]) Get Driving Directions [phone] [address] [place],  [place]  [address]  [facility] Health Urgent Care Center [facility] - at [employer]  [phone] [address] [place],  [place] [address]   [facility] Health Urgent Care at [facility] Get Driving Directions [phone] [address], [address] [place], [place] [address]   [facility] Health Urgent Care at [facility] Get Driving Directions  [phone] [address] [place], [place] [address]   [facility] Health Urgent Care at [facility] Directions [phone] [address] [place], [place] [address]  Your MyChart E-visit questionnaire answers were reviewed by a board certified advanced clinical practitioner to complete your personal care plan based on your specific symptoms.  Thank you for using  e-Visits.    I have spent 5 minutes in review of e-visit questionnaire, review and updating patient chart, medical decision making and response to patient.   Margaretann Loveless, PA-C

## 2023-01-09 ENCOUNTER — Ambulatory Visit: Payer: BLUE CROSS/BLUE SHIELD | Admitting: Pharmacist

## 2023-01-11 ENCOUNTER — Ambulatory Visit: Payer: Medicaid Other | Admitting: Pharmacist

## 2023-02-24 ENCOUNTER — Other Ambulatory Visit (HOSPITAL_COMMUNITY): Payer: Self-pay

## 2023-03-14 ENCOUNTER — Encounter (HOSPITAL_COMMUNITY): Payer: Self-pay | Admitting: Internal Medicine

## 2023-03-21 ENCOUNTER — Ambulatory Visit: Payer: 59 | Admitting: Internal Medicine

## 2023-04-06 ENCOUNTER — Telehealth: Payer: 59 | Admitting: Family Medicine

## 2023-04-06 DIAGNOSIS — B20 Human immunodeficiency virus [HIV] disease: Secondary | ICD-10-CM

## 2023-04-06 DIAGNOSIS — J40 Bronchitis, not specified as acute or chronic: Secondary | ICD-10-CM | POA: Diagnosis not present

## 2023-04-06 MED ORDER — LEVOFLOXACIN 500 MG PO TABS: 500.0000 mg | ORAL_TABLET | Freq: Every day | ORAL | 0 refills | Status: AC

## 2023-04-06 MED ORDER — PREDNISONE 20 MG PO TABS: 20.0000 mg | ORAL_TABLET | Freq: Two times a day (BID) | ORAL | 0 refills | Status: AC

## 2023-04-06 NOTE — Progress Notes (Signed)
Virtual Visit Consent   Rick Lam, you are scheduled for a virtual visit with a [facility] Health provider today. Just as with appointments in the office, your consent must be obtained to participate. Your consent will be active for this visit and any virtual visit you may have with one of our providers in the next 365 days. If you have a MyChart account, a copy of this consent can be sent to you electronically.  As this is a virtual visit, video technology does not allow for your provider to perform a traditional examination. This may limit your provider's ability to fully assess your condition. If your provider identifies any concerns that need to be evaluated in person or the need to arrange testing (such as labs, EKG, etc.), we will make arrangements to do so. Although advances in technology are sophisticated, we cannot ensure that it will always work on either your end or our end. If the connection with a video visit is poor, the visit may have to be switched to a telephone visit. With either a video or telephone visit, we are not always able to ensure that we have a secure connection.  By engaging in this virtual visit, you consent to the provision of healthcare and authorize for your insurance to be billed (if applicable) for the services provided during this visit. Depending on your insurance coverage, you may receive a charge related to this service.  I need to obtain your verbal consent now. Are you willing to proceed with your visit today? Rick Lam has provided verbal consent on 04/06/2023 for a virtual visit (video or telephone). Georgana Curio, FNP  Date: 04/06/2023 12:12 PM  Virtual Visit via Video Note   I, Georgana Curio, connected with  Rick Lam  ([id number], [birth year]) on 04/06/23 at 12:15 PM EDT by a video-enabled telemedicine application and verified that I am speaking with the correct person using two identifiers.  Location: Patient: Virtual Visit Location Patient:  Home Provider: Virtual Visit Location Provider: Home Office   I discussed the limitations of evaluation and management by telemedicine and the availability of in person appointments. The patient expressed understanding and agreed to proceed.    History of Present Illness: Rick Lam is a 32 y.o. who identifies as a male who was assigned male at birth, and is being seen today for cough, fever and wheezing for the past week worsening. He says he is not able to use inhalers. He was sick last year in ICU with a "lung infection" and wants to catch it early this time. Marland Kitchen  HPI: HPI  Problems:  Patient Active Problem List   Diagnosis Date Noted   Empyema (HCC)    Pleural effusion    Acute septic pulmonary embolism without acute cor pulmonale (HCC)    Cutaneous abscess of left upper extremity    Acute respiratory failure with hypoxia (HCC) 02/08/2022   Cellulitis of left arm    Multifocal pneumonia    MRSA bacteremia    Severe sepsis (HCC) 02/07/2022   Hyponatremia 02/07/2022   Chronic hepatitis C without hepatic coma (HCC) 07/06/2021   AIDS (acquired immune deficiency syndrome) (HCC) 06/01/2021   Oral thrush 06/01/2021   Rash 06/01/2021   Syphilis 06/01/2021   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 (Patient not taking: Reported on 09/18/2022), Disp: 30 tablet, Rfl: 1   guaiFENesin (MUCINEX) 600 MG 12 hr tablet, Take 1 tablet (600 mg total) by mouth 2 (two) times daily., Disp: 30 tablet, Rfl: 0   polyethylene glycol powder (GLYCOLAX/MIRALAX) 17 GM/SCOOP powder, Take 17 g by  mouth 2 (two) times daily., Disp: 238 g, Rfl: 0   sulfamethoxazole-trimethoprim (BACTRIM DS) 800-160 MG tablet, Take 1 tablet by mouth 3 (three) times a week., Disp: 90 tablet, Rfl: 0   traZODone (DESYREL) 100 MG tablet, Take 1 tablet (100 mg total) by mouth at bedtime as needed for sleep., Disp: 30 tablet, Rfl: 1  Observations/Objective: Patient is well-developed, well-nourished in no acute distress.  Resting comfortably  at home.  Head is normocephalic, atraumatic.  No labored breathing.  Speech is clear and coherent with logical content.  Patient is alert and oriented at baseline.  He is in no distress.   Assessment and Plan: 1. AIDS (acquired immune deficiency syndrome) (HCC)  2. Bronchitis  Increase fluids, humidifier at night, ER if sx persist or worsen. He plans to get back in with his provider for HIV meds.   Follow Up Instructions: I discussed the assessment and treatment plan with the patient. The patient was provided an opportunity to ask questions and all were answered. The patient agreed with the plan and demonstrated an understanding of the instructions.  A copy of instructions were sent to the patient via MyChart unless otherwise noted below.     The patient was advised to call back or seek an in-person evaluation if the symptoms worsen or if the condition fails to improve as anticipated.    Georgana Curio, FNP

## 2023-04-06 NOTE — Patient Instructions (Signed)

## 2023-04-24 ENCOUNTER — Other Ambulatory Visit: Payer: Self-pay

## 2023-04-24 ENCOUNTER — Other Ambulatory Visit (HOSPITAL_COMMUNITY): Payer: Self-pay

## 2023-04-24 ENCOUNTER — Telehealth: Payer: Self-pay

## 2023-04-24 ENCOUNTER — Encounter: Payer: Self-pay | Admitting: Internal Medicine

## 2023-04-24 ENCOUNTER — Encounter (HOSPITAL_COMMUNITY): Payer: Self-pay | Admitting: Internal Medicine

## 2023-04-24 ENCOUNTER — Other Ambulatory Visit: Payer: Self-pay | Admitting: Pharmacist

## 2023-04-24 ENCOUNTER — Ambulatory Visit (INDEPENDENT_AMBULATORY_CARE_PROVIDER_SITE_OTHER): Payer: 59 | Admitting: Internal Medicine

## 2023-04-24 VITALS — BP 102/68 | HR 99 | Resp 16 | Ht 72.0 in | Wt 182.0 lb

## 2023-04-24 DIAGNOSIS — Z9189 Other specified personal risk factors, not elsewhere classified: Secondary | ICD-10-CM | POA: Insufficient documentation

## 2023-04-24 DIAGNOSIS — F32A Depression, unspecified: Secondary | ICD-10-CM | POA: Diagnosis not present

## 2023-04-24 DIAGNOSIS — B20 Human immunodeficiency virus [HIV] disease: Secondary | ICD-10-CM

## 2023-04-24 MED ORDER — SULFAMETHOXAZOLE-TRIMETHOPRIM 800-160 MG PO TABS: 1.0000 | ORAL_TABLET | ORAL | 11 refills | Status: DC

## 2023-04-24 MED ORDER — SULFAMETHOXAZOLE-TRIMETHOPRIM 800-160 MG PO TABS
1.0000 | ORAL_TABLET | ORAL | 11 refills | Status: DC
  Filled 2023-04-24: qty 12, 28d supply, fill #0
  Filled 2023-06-04: qty 12, 28d supply, fill #1

## 2023-04-24 MED ORDER — BICTEGRAVIR-EMTRICITAB-TENOFOV 50-200-25 MG PO TABS
1.0000 | ORAL_TABLET | Freq: Every day | ORAL | 11 refills | Status: AC
  Filled 2023-04-24: qty 30, 30d supply, fill #0
  Filled 2023-05-25: qty 30, 30d supply, fill #1
  Filled 2023-06-29: qty 30, 30d supply, fill #2

## 2023-04-24 MED ORDER — BICTEGRAVIR-EMTRICITAB-TENOFOV 50-200-25 MG PO TABS: 1.0000 | ORAL_TABLET | Freq: Every day | ORAL | 11 refills | Status: DC

## 2023-04-24 NOTE — Assessment & Plan Note (Signed)
He is in need to get back into care and paper referral sent for local mental health.

## 2023-04-24 NOTE — Telephone Encounter (Signed)
Patient to return this week for labs as he needs hydration per [place] in lab.

## 2023-04-24 NOTE — Telephone Encounter (Signed)
[facility] [place] referral form sent in so patient can see for Mental Health/Behav. Health.

## 2023-04-24 NOTE — Assessment & Plan Note (Signed)
Bactrim refilled, he will continue and will check the CD4 today

## 2023-04-24 NOTE — Progress Notes (Signed)
Specialty Pharmacy Initiation Note   Rick Lam is a 32 y.o. male who will be followed by the specialty pharmacy service for RxSp HIV    Review of administration, indication, effectiveness, safety, potential side effects, storage/disposable, and missed dose instructions occurred today for patient's specialty medication(s) Bictegravir-Emtricitab-Tenofov     Patient/Caregiver did not have any additional questions or concerns.   Patient's therapy is appropriate to: Initiate    Goals Addressed             This Visit's Progress    Achieve Undetectable HIV Viral Load < 20       Patient is on track. Patient will maintain adherence.      Comply with lab assessments       Patient is on track. Patient will adhere to provider and/or lab appointments.      Improve or maintain quality of life       Patient is on track. Patient will be monitored by provider to determine if a change in treatment plan is warranted.         Anthonio Mizzell L. Maude Gloor, PharmD, BCIDP, AAHIVP, CPP Clinical Pharmacist Practitioner Infectious Diseases Clinical Pharmacist [facility] 04/24/2023, 1:21 PM

## 2023-04-24 NOTE — Progress Notes (Signed)
Specialty Pharmacy Initial Fill Coordination Note  Rick Lam is a 32 y.o. male contacted today regarding refills of specialty medication(s) Bictegravir-Emtricitab-Tenofov   Patient requested Delivery   Delivery date: 04/26/23   Verified address: [address] [place] [address]   Medication will be filled on 04/25/23.   Patient is aware of $0 copayment.

## 2023-04-24 NOTE — Assessment & Plan Note (Signed)
Discussed treatment options and that Cabenuva not a good option for him at this time.  Will start Biktarvy.  Will get [facility].   Follow up in 1 month
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
## 2023-04-25 ENCOUNTER — Other Ambulatory Visit (HOSPITAL_COMMUNITY): Payer: Self-pay

## 2023-04-25 ENCOUNTER — Other Ambulatory Visit: Payer: Self-pay

## 2023-04-25 ENCOUNTER — Other Ambulatory Visit: Payer: 59

## 2023-04-30 ENCOUNTER — Other Ambulatory Visit: Payer: Self-pay

## 2023-05-09 ENCOUNTER — Other Ambulatory Visit: Payer: Self-pay

## 2023-05-17 ENCOUNTER — Other Ambulatory Visit: Payer: Self-pay

## 2023-05-22 ENCOUNTER — Other Ambulatory Visit: Payer: Self-pay

## 2023-05-22 ENCOUNTER — Ambulatory Visit (INDEPENDENT_AMBULATORY_CARE_PROVIDER_SITE_OTHER): Payer: 59 | Admitting: Internal Medicine

## 2023-05-22 ENCOUNTER — Encounter: Payer: Self-pay | Admitting: Internal Medicine

## 2023-05-22 VITALS — BP 125/76 | HR 100 | Temp 97.6°F | Wt 174.0 lb

## 2023-05-22 DIAGNOSIS — Z5181 Encounter for therapeutic drug level monitoring: Secondary | ICD-10-CM

## 2023-05-22 DIAGNOSIS — B20 Human immunodeficiency virus [HIV] disease: Secondary | ICD-10-CM | POA: Diagnosis not present

## 2023-05-22 DIAGNOSIS — Z113 Encounter for screening for infections with a predominantly sexual mode of transmission: Secondary | ICD-10-CM | POA: Diagnosis not present

## 2023-05-22 DIAGNOSIS — Z9189 Other specified personal risk factors, not elsewhere classified: Secondary | ICD-10-CM

## 2023-05-23 ENCOUNTER — Encounter: Payer: Self-pay | Admitting: Internal Medicine

## 2023-05-23 DIAGNOSIS — Z5181 Encounter for therapeutic drug level monitoring: Secondary | ICD-10-CM | POA: Insufficient documentation

## 2023-05-23 LAB — T-HELPER CELL (CD4) - (RCID CLINIC ONLY)
CD4 % Helper T Cell: 14 % — ABNORMAL LOW (ref 33–65)
CD4 T Cell Abs: 206 /uL — ABNORMAL LOW (ref 400–1790)

## 2023-05-23 NOTE — Progress Notes (Signed)
   Subjective:    Patient ID: Rick Lam, male    DOB: [birth year], 32 y.o.   MRN: [id number]  HPI Demarquise is here for follow up of HIV He continues on Green with no missed doses.  He has no new complaints.  No issues with getting or taking his medication.     Review of Systems  Constitutional:  Negative for fatigue.  Gastrointestinal:  Negative for diarrhea.  Skin:  Negative for rash.       Objective:   Physical Exam Pulmonary:     Effort: Pulmonary effort is normal.  Skin:    Findings: No rash.  Neurological:     Mental Status: He is alert.   SH: no tobacco        Assessment & Plan:

## 2023-05-23 NOTE — Assessment & Plan Note (Signed)
Will check his labs today and otherwise rtc in 3 months.

## 2023-05-23 NOTE — Assessment & Plan Note (Signed)
He will continue with Bactrim

## 2023-05-23 NOTE — Assessment & Plan Note (Signed)
Will check a cmp and cbc

## 2023-05-25 ENCOUNTER — Other Ambulatory Visit: Payer: Self-pay

## 2023-05-25 LAB — COMPLETE METABOLIC PANEL WITH GFR
AG Ratio: 1.3 (calc) (ref 1.0–2.5)
ALT: 13 U/L (ref 9–46)
AST: 19 U/L (ref 10–40)
Albumin: 4.3 g/dL (ref 3.6–5.1)
Alkaline phosphatase (APISO): 89 U/L (ref 36–130)
BUN: 15 mg/dL (ref 7–25)
CO2: 26 mmol/L (ref 20–32)
Calcium: 9.1 mg/dL (ref 8.6–10.3)
Chloride: 104 mmol/L (ref 98–110)
Creat: 1.13 mg/dL (ref 0.60–1.26)
Globulin: 3.2 g/dL (ref 1.9–3.7)
Glucose, Bld: 93 mg/dL (ref 65–99)
Potassium: 4.1 mmol/L (ref 3.5–5.3)
Sodium: 139 mmol/L (ref 135–146)
Total Bilirubin: 0.4 mg/dL (ref 0.2–1.2)
Total Protein: 7.5 g/dL (ref 6.1–8.1)
eGFR: 87 mL/min/{1.73_m2} (ref >=60)

## 2023-05-25 LAB — CBC WITH DIFFERENTIAL/PLATELET
Absolute Lymphocytes: 1521 {cells}/uL (ref 850–3900)
Absolute Monocytes: 286 {cells}/uL (ref 200–950)
Basophils Absolute: 58 {cells}/uL (ref 0–200)
Basophils Relative: 1.1 %
Eosinophils Absolute: 117 {cells}/uL (ref 15–500)
Eosinophils Relative: 2.2 %
HCT: 43 % (ref 38.5–50.0)
Hemoglobin: 14.3 g/dL (ref 13.2–17.1)
MCH: 28.8 pg (ref 27.0–33.0)
MCHC: 33.3 g/dL (ref 32.0–36.0)
MCV: 86.7 fL (ref 80.0–100.0)
MPV: 10.5 fL (ref 7.5–12.5)
Monocytes Relative: 5.4 %
Neutro Abs: 3318 {cells}/uL (ref 1500–7800)
Neutrophils Relative %: 62.6 %
Platelets: 324 10*3/uL (ref 140–400)
RBC: 4.96 10*6/uL (ref 4.20–5.80)
RDW: 13.3 % (ref 11.0–15.0)
Total Lymphocyte: 28.7 %
WBC: 5.3 10*3/uL (ref 3.8–10.8)

## 2023-05-25 LAB — RPR TITER: RPR Titer: 1:4 {titer} — ABNORMAL HIGH

## 2023-05-25 LAB — RPR: RPR Ser Ql: REACTIVE — AB

## 2023-05-25 LAB — HIV-1 RNA QUANT-NO REFLEX-BLD
HIV 1 RNA Quant: <20 {copies}/mL — ABNORMAL HIGH
HIV-1 RNA Quant, Log: <1.3 {Log_copies}/mL — ABNORMAL HIGH

## 2023-05-25 LAB — T PALLIDUM AB: T Pallidum Abs: POSITIVE — AB

## 2023-05-25 NOTE — Progress Notes (Signed)
Specialty Pharmacy Refill Coordination Note  Rick Lam is a 32 y.o. male contacted today regarding refills of specialty medication(s) Bictegravir-Emtricitab-Tenofov   Patient requested Delivery   Delivery date: 06/08/23   Verified address: [address] [place] [address]   Medication will be filled on 06/07/23.

## 2023-06-04 ENCOUNTER — Encounter: Payer: Self-pay | Admitting: Pharmacist

## 2023-06-05 ENCOUNTER — Other Ambulatory Visit: Payer: Self-pay | Admitting: Pharmacist

## 2023-06-05 ENCOUNTER — Other Ambulatory Visit: Payer: Self-pay

## 2023-06-05 DIAGNOSIS — B20 Human immunodeficiency virus [HIV] disease: Secondary | ICD-10-CM

## 2023-06-05 MED ORDER — SULFAMETHOXAZOLE-TRIMETHOPRIM 800-160 MG PO TABS: 1.0000 | ORAL_TABLET | ORAL | 11 refills | Status: AC

## 2023-06-26 ENCOUNTER — Other Ambulatory Visit: Payer: Self-pay

## 2023-06-29 ENCOUNTER — Other Ambulatory Visit (HOSPITAL_COMMUNITY): Payer: Self-pay

## 2023-06-29 ENCOUNTER — Other Ambulatory Visit: Payer: Self-pay

## 2023-06-29 ENCOUNTER — Other Ambulatory Visit (HOSPITAL_COMMUNITY): Payer: Self-pay | Admitting: Pharmacy Technician

## 2023-06-29 NOTE — Progress Notes (Signed)
 Specialty Pharmacy Refill Coordination Note  Rick Lam is a 33 y.o. male contacted today regarding refills of specialty medication(s) Bictegravir-Emtricitab-Tenofov   Patient requested (Patient-Rptd) Delivery   Delivery date: (Patient-Rptd) 07/02/23   Verified address: (Patient-Rptd) [address] [place] [address]   Medication will be filled on 07/02/23.   Ship 1/6; Ins will pay on 1/5 but pharmacy is close.

## 2023-07-23 ENCOUNTER — Other Ambulatory Visit: Payer: Self-pay

## 2023-07-23 ENCOUNTER — Other Ambulatory Visit: Payer: Self-pay | Admitting: Pharmacist

## 2023-07-23 NOTE — Progress Notes (Signed)
Specialty Pharmacy Ongoing Clinical Assessment Note  Rick Lam is a 33 y.o. male who is being followed by the specialty pharmacy service for RxSp HIV   Patient's specialty medication(s) reviewed today: Bictegravir-Emtricitab-Tenofov (BIKTARVY)   Missed doses in the last 4 weeks: 0   Patient/Caregiver did not have any additional questions or concerns.   Therapeutic benefit summary: Patient is achieving benefit   Adverse events/side effects summary: No adverse events/side effects   Patient's therapy is appropriate to: Continue    Goals Addressed   None     Follow up:  6 months  Jennette Kettle Specialty Pharmacist

## 2023-07-27 ENCOUNTER — Other Ambulatory Visit (HOSPITAL_COMMUNITY): Payer: Self-pay

## 2023-07-30 ENCOUNTER — Other Ambulatory Visit: Payer: Self-pay

## 2023-08-06 ENCOUNTER — Other Ambulatory Visit (HOSPITAL_COMMUNITY): Payer: Self-pay

## 2023-08-15 ENCOUNTER — Ambulatory Visit: Payer: 59 | Admitting: Internal Medicine

## 2023-10-06 ENCOUNTER — Encounter: Payer: Self-pay | Admitting: Pharmacist

## 2023-11-27 ENCOUNTER — Other Ambulatory Visit: Payer: Self-pay

## 2024-01-01 ENCOUNTER — Other Ambulatory Visit: Payer: Self-pay

## 2024-03-26 ENCOUNTER — Other Ambulatory Visit: Payer: Self-pay

## 2024-03-26 NOTE — Progress Notes (Signed)
 Patient is being dis enrolled due to the patient transferring care. Patient has moved to [facility] and will find a new doctor in his area. Per note from 10/06/23

## 2024-03-31 ENCOUNTER — Encounter (HOSPITAL_COMMUNITY): Payer: Self-pay | Admitting: Internal Medicine

## 2024-04-11 ENCOUNTER — Ambulatory Visit: Admitting: Internal Medicine
